# Patient Record
Sex: Female | Born: 1985
Health system: Southern US, Community
[De-identification: ages and names within clinical notes are randomized; demographics above are authoritative.]

## PROBLEM LIST (undated history)

## (undated) DIAGNOSIS — M459 Ankylosing spondylitis of unspecified sites in spine: Secondary | ICD-10-CM

## (undated) DIAGNOSIS — M069 Rheumatoid arthritis, unspecified: Secondary | ICD-10-CM

## (undated) DIAGNOSIS — D649 Anemia, unspecified: Secondary | ICD-10-CM

## (undated) HISTORY — DX: Ankylosing spondylitis of unspecified sites in spine: M45.9

## (undated) HISTORY — DX: Rheumatoid arthritis, unspecified: M06.9

---

## 2010-10-08 DIAGNOSIS — O321XX0 Maternal care for breech presentation, not applicable or unspecified: Secondary | ICD-10-CM

## 2014-08-23 DIAGNOSIS — O321XX0 Maternal care for breech presentation, not applicable or unspecified: Secondary | ICD-10-CM

## 2016-08-29 ENCOUNTER — Ambulatory Visit (INDEPENDENT_AMBULATORY_CARE_PROVIDER_SITE_OTHER): Payer: Self-pay | Admitting: Physician Assistant

## 2016-09-02 ENCOUNTER — Telehealth (INDEPENDENT_AMBULATORY_CARE_PROVIDER_SITE_OTHER): Payer: Self-pay | Admitting: Physician Assistant

## 2016-09-02 ENCOUNTER — Ambulatory Visit (INDEPENDENT_AMBULATORY_CARE_PROVIDER_SITE_OTHER): Payer: Self-pay | Admitting: Physician Assistant

## 2016-09-02 ENCOUNTER — Ambulatory Visit (HOSPITAL_COMMUNITY)
Admission: RE | Admit: 2016-09-02 | Discharge: 2016-09-02 | Disposition: A | Discharge status: Home / Self Care | Payer: Self-pay | Source: Ambulatory Visit | Attending: Physician Assistant | Admitting: Physician Assistant

## 2016-09-02 ENCOUNTER — Encounter (INDEPENDENT_AMBULATORY_CARE_PROVIDER_SITE_OTHER): Payer: Self-pay | Admitting: Physician Assistant

## 2016-09-02 VITALS — BP 99/65 | HR 81 | Temp 97.9°F | Ht 64.0 in | Wt 146.6 lb

## 2016-09-02 DIAGNOSIS — M5441 Lumbago with sciatica, right side: Secondary | ICD-10-CM | POA: Insufficient documentation

## 2016-09-02 DIAGNOSIS — N390 Urinary tract infection, site not specified: Secondary | ICD-10-CM

## 2016-09-02 DIAGNOSIS — R8281 Pyuria: Secondary | ICD-10-CM

## 2016-09-02 LAB — POCT URINALYSIS DIPSTICK
BILIRUBIN UA: NEGATIVE
Glucose, UA: NEGATIVE
NITRITE UA: NEGATIVE
PH UA: 5.5
PROTEIN UA: NEGATIVE
Spec Grav, UA: 1.02
UROBILINOGEN UA: 0.2

## 2016-09-02 LAB — POCT URINE PREGNANCY: PREG TEST UR: NEGATIVE

## 2016-09-02 MED ORDER — NAPROXEN 500 MG PO TABS: 500.0000 mg | ORAL_TABLET | Freq: Two times a day (BID) | ORAL | 1 refills | Status: DC

## 2016-09-02 MED ORDER — CIPROFLOXACIN HCL 500 MG PO TABS: 500.0000 mg | ORAL_TABLET | Freq: Two times a day (BID) | ORAL | 0 refills | Status: AC

## 2016-09-02 MED ORDER — CYCLOBENZAPRINE HCL 5 MG PO TABS: 5.0000 mg | ORAL_TABLET | Freq: Three times a day (TID) | ORAL | 1 refills | Status: DC | PRN

## 2016-09-02 MED ORDER — CIPROFLOXACIN HCL 500 MG PO TABS: 500.0000 mg | ORAL_TABLET | Freq: Two times a day (BID) | ORAL | 0 refills | Status: DC

## 2016-09-02 MED FILL — ?NAPROXEN 500 MG TAB: 500 MG | 7 days supply | Qty: 14 | Fill #0

## 2016-09-02 MED FILL — CYCLOBENZAPRINE 5 MG TABLET: 5 | 10 days supply | Qty: 30 | Fill #0

## 2016-09-02 MED FILL — ?CIPROFLOXACIN HCL 500MG TA: 500 | 3 days supply | Qty: 6 | Fill #0

## 2016-09-02 NOTE — Progress Notes (Signed)
Subjective:  Patient ID: Kathleen Nguyen, female    DOB: 1985-07-11  Age: 31 y.o. MRN: [id number]  CC:   Lower back pain  HPI Kathleen Nguyen is a 31 y.o. female with a PMH of chronic lower back pain since 6-7 years ago. Pain is felt in the lumbar spine and is associated with RLE radiculopathy.  There is also occasional pain in the T and C spine. Pain can be severe at times. No hx of fall/injury/trauma. Denies saddle paresthesia, GI dysfunction, GU dysfunction, dysuria, or LE weakness, or paralysis.     Review of Systems  Constitutional: Negative for chills, fever and malaise/fatigue.  Eyes: Negative for blurred vision.  Respiratory: Negative for shortness of breath.   Cardiovascular: Negative for chest pain and palpitations.  Gastrointestinal: Negative for abdominal pain and nausea.  Genitourinary: Negative for dysuria and hematuria.  Musculoskeletal: Positive for back pain and neck pain. Negative for joint pain and myalgias.  Skin: Negative for rash.  Neurological: Negative for tingling and headaches.  Psychiatric/Behavioral: Negative for depression. The patient is not nervous/anxious.     Objective:  BP 99/65 (BP Location: Left Arm, Patient Position: Sitting, Cuff Size: Normal)   Pulse 81   Temp 97.9 F (36.6 C) (Oral)   Ht 5\' 4"  (1.626 m)   Wt 146 lb 9.6 oz (66.5 kg)   LMP 07/29/2016 (Exact Date)   SpO2 100%   BMI 25.16 kg/m   BP/Weight 5/46/2703  Systolic BP 99  Diastolic BP 65  Wt. (Lbs) 146.6  BMI 25.16      Physical Exam  Constitutional: She is oriented to person, place, and time.  Well developed, well nourished, NAD, polite  HENT:  Head: Normocephalic and atraumatic.  Eyes: No scleral icterus.  Neck: Normal range of motion. Neck supple. No thyromegaly present.  Cardiovascular: Normal rate, regular rhythm and normal heart sounds.   Pulmonary/Chest: Effort normal and breath sounds normal.  Abdominal: Soft. Bowel sounds are normal. There is no tenderness.   Musculoskeletal: She exhibits no edema.  Back with full aROM. Mild TTP on L3-L5 vertebrae and paraspinals. R > L back pain elicited with hip flexion. No trochanteric TTP.  Neurological: She is alert and oriented to person, place, and time.  Skin: Skin is warm and dry. No rash noted. No erythema. No pallor.  Psychiatric: She has a normal mood and affect. Her behavior is normal. Thought content normal.  Vitals reviewed.    Assessment & Plan:   1. Bilateral low back pain with right-sided sciatica, unspecified chronicity - Urinalysis Dipstick - POCT urine pregnancy - cyclobenzaprine (FLEXERIL) 5 MG tablet; Take 1 tablet (5 mg total) by mouth 3 (three) times daily as needed for muscle spasms.  Dispense: 30 tablet; Refill: 1 - naproxen (NAPROSYN) 500 MG tablet; Take 1 tablet (500 mg total) by mouth 2 (two) times daily with a meal.  Dispense: 14 tablet; Refill: 1 - DG Lumbar Spine Complete  2. Pyuria - ciprofloxacin (CIPRO) 500 MG tablet; Take 1 tablet (500 mg total) by mouth 2 (two) times daily.  Dispense: 6 tablet; Refill: 0 - Urine culture   Meds ordered this encounter  Medications  . cyclobenzaprine (FLEXERIL) 5 MG tablet    Sig: Take 1 tablet (5 mg total) by mouth 3 (three) times daily as needed for muscle spasms.    Dispense:  30 tablet    Refill:  1    Order Specific Question:   Supervising Provider    Answer:   Angelica Chessman  E [[id number]]  . naproxen (NAPROSYN) 500 MG tablet    Sig: Take 1 tablet (500 mg total) by mouth 2 (two) times daily with a meal.    Dispense:  14 tablet    Refill:  1    Order Specific Question:   Supervising Provider    Answer:   Tresa Garter [id number]  . ciprofloxacin (CIPRO) 500 MG tablet    Sig: Take 1 tablet (500 mg total) by mouth 2 (two) times daily.    Dispense:  6 tablet    Refill:  0    Order Specific Question:   Supervising Provider    Answer:   Tresa Garter [[id number]]    Follow-up: Return in about 10 days (around  09/12/2016).   Clent Demark PA

## 2016-09-02 NOTE — Telephone Encounter (Signed)
Rx sent to [facility] pharmacy. Patient notified. Nat Christen, CMA

## 2016-09-02 NOTE — Patient Instructions (Signed)

## 2016-09-02 NOTE — Telephone Encounter (Signed)
Patient at [facility] for Rx,  But they were sent to [facility]  Per Deana, at [facility] please send Rx to pharm

## 2016-09-09 ENCOUNTER — Telehealth (INDEPENDENT_AMBULATORY_CARE_PROVIDER_SITE_OTHER): Payer: Self-pay | Admitting: Physician Assistant

## 2016-09-09 NOTE — Telephone Encounter (Signed)
Patient called stated [facility] told her no Rx For her.  Patient also wants to discuss labs result with doctor and nurse with an Arabic interpreter.  Please call [phone]

## 2016-09-09 NOTE — Telephone Encounter (Signed)
Return here for a dedicated time to discuss results. Get [facility] to transfer Rx to [facility] or tell patient to pick up at [facility].

## 2016-09-10 NOTE — Telephone Encounter (Signed)
Spoke with [facility] pharmacy Rx is there and ready for pick up. Nat Christen, CMA

## 2016-09-10 NOTE — Telephone Encounter (Signed)
Spoke with patient. Appointment scheduled for 3/22 at 10:15 for lab results. Patient stated [facility] does not have Rx there for her. Will call and verify, if in fact they do not will have PCP resend. Nat Christen, CMA

## 2016-09-12 ENCOUNTER — Ambulatory Visit (INDEPENDENT_AMBULATORY_CARE_PROVIDER_SITE_OTHER): Payer: Self-pay | Admitting: Physician Assistant

## 2016-09-12 ENCOUNTER — Encounter (INDEPENDENT_AMBULATORY_CARE_PROVIDER_SITE_OTHER): Payer: Self-pay | Admitting: Physician Assistant

## 2016-09-12 VITALS — BP 103/70 | HR 83 | Temp 99.4°F | Ht 64.0 in | Wt 148.0 lb

## 2016-09-12 DIAGNOSIS — R8281 Pyuria: Secondary | ICD-10-CM

## 2016-09-12 DIAGNOSIS — M545 Low back pain, unspecified: Secondary | ICD-10-CM

## 2016-09-12 DIAGNOSIS — R0981 Nasal congestion: Secondary | ICD-10-CM

## 2016-09-12 DIAGNOSIS — N390 Urinary tract infection, site not specified: Secondary | ICD-10-CM

## 2016-09-12 DIAGNOSIS — R5383 Other fatigue: Secondary | ICD-10-CM

## 2016-09-12 LAB — POCT URINALYSIS DIPSTICK
BILIRUBIN UA: NEGATIVE
Glucose, UA: NEGATIVE
Nitrite, UA: NEGATIVE
Protein, UA: NEGATIVE
Spec Grav, UA: 1.02 (ref 1.030–1.035)
Urobilinogen, UA: 0.2 (ref ?–2.0)
pH, UA: 6.5 (ref 5.0–8.0)

## 2016-09-12 LAB — POCT URINE PREGNANCY: Preg Test, Ur: NEGATIVE

## 2016-09-12 MED ORDER — DIPHENHYDRAMINE HCL 25 MG PO TABS: 25.0000 mg | ORAL_TABLET | Freq: Two times a day (BID) | ORAL | 0 refills | Status: DC

## 2016-09-12 MED ORDER — CIPROFLOXACIN HCL 500 MG PO TABS: 500.0000 mg | ORAL_TABLET | Freq: Two times a day (BID) | ORAL | 0 refills | Status: AC

## 2016-09-12 NOTE — Patient Instructions (Signed)
Urine Culture and Sensitivity Testing Why am I having this test? A urine culture is a test to see if germs grow from your urine sample. Normally, urine is free of germs (sterile). Germs in urine are usually bacteria. Sometimes they can be yeasts. These germs can cause a urinary tract infection (UTI). You may have this test if you have symptoms of a UTI. These may include:  Frequent urination.  Burning pain when passing urine. If you are pregnant, your health care provider may order this test to screen you for a UTI. When you pass urine, the urine flows through the tube that empties your bladder (urethra). In men, urine comes out through an opening at the tip of the penis. In women, it comes out of the body from just above the vaginal opening. These areas may have bacteria near them that normally live on the skin (normal flora). What kind of sample is taken? A urine sample for a culture test must be collected in a way that keeps normal flora from getting into the sample. The method used most often is called a clean-catch sample. In a few cases, urine may need to be collected directly from the bladder using a thin, flexible tube (catheter). The health care provider puts the catheter through the person's urethra and into the bladder. Your urine sample will be placed onto plates containing a substance that encourages bacteria to grow (agar plates). These plates are kept at body temperature for 24-48 hours to see if bacteria or other germs grow. Then, a lab technician examines them under a microscope to check for germs. Any germs that grow from the culture will be tested against a variety of medicines to find the one that works best (sensitivity testing). For a UTI caused by bacteria, several types of antibiotic medicines may be tested. How do I prepare for this test?  Do not urinate for about an hour before collecting the sample.  Drink a glass of water about 20 minutes before collecting the  sample.  Tell your health care provider if you have been taking antibiotics. This may affect the results of your test. Your health care provider may give you sterile wipes to clean your vagina or penis to prepare for collecting a clean-catch sample. To collect the sample, you will need to do the following: For Women and Girls   Sit on the toilet and spread the lips of your vagina.  Use one wipe to clean your vaginal area from front to back.  Use a second wipe to clean the opening of your urethra.  Pass a small amount of urine directly into the toilet while still spreading your vagina.  Then, hold the sterile cup underneath you and urinate into it.  Fill the cup about halfway. Cap it and return it for testing. For Men and Boys   Use the sterile wipe to clean the tip of your penis.  Pass a small amount of urine directly into the toilet first.  Then, urinate into the sterile cup.  Fill the cup about halfway. Cap it and return it for testing. What do the results mean? The result of a urine culture and sensitivity test will be positive or negative.  If enough bacteria grow from your urine sample, your test result is considered positive.  If many different bacteria grow from your urine sample, your test may be reported as contaminated.  If no bacteria grow from your sample after 24-48 hours, your test result is considered negative.  Results of sensitivity testing let your health care provider know which medicines to use to treat your infection. If the results of your urine culture are negative, this means:  It is less likely that you have a UTI.  Your test may be repeated if you still have symptoms. If the results of your urine culture are positive, this means:  It is more likely that you have a UTI.  You may need to start treatment based on your sensitivity results. Talk to your health care provider to discuss your results, treatment options, and if necessary, the need for  more tests. It is your responsibility to obtain your test results. Ask the lab or department performing the test when and how you will get your results. Talk with your health care provider if you have any questions about your results. Talk with your health care provider to discuss your results, treatment options, and if necessary, the need for more tests. Talk with your health care provider if you have any questions about your results. This information is not intended to replace advice given to you by your health care provider. Make sure you discuss any questions you have with your health care provider. Document Released: 07/05/2004 Document Revised: 02/14/2016 Document Reviewed: 10/07/2013 Elsevier Interactive Patient Education  2017 [employer].

## 2016-09-12 NOTE — Progress Notes (Signed)
Subjective:  Patient ID: Kathleen Nguyen, female    DOB: November 29, 1985  Age: 31 y.o. MRN: [id number]  CC: discuss lab results  HPI Kathleen Nguyen is a 31 y.o. female with a recent hx of LBP. Found to have trace leukocytes on last encounter 10 days ago. Laboratory never cultured due to not having specimen. Patient did not fill Cipro due to not knowing where [facility] was. XR of lumbar spine was unremarkable. Still feels nonradiating pain in the lower right flank and back. Patient is concerned and frustrated that information is lacking and she has not been able to start cipro yet. She also complains of nasal congestion and sore throat x3 days. No close contacts with the same. Lastly, she is concerned that she may have something wrong with her thyroid due to her sore throat. Denies fever, chills, nausea, vomiting, CP, SOB, HA, abdominal pain, rash, dysuria, urinary frequency, anuria, oliguria, or bowel dysfunction.    Outpatient Medications Prior to Visit  Medication Sig Dispense Refill  . cyclobenzaprine (FLEXERIL) 5 MG tablet Take 1 tablet (5 mg total) by mouth 3 (three) times daily as needed for muscle spasms. 30 tablet 1  . medroxyPROGESTERone (DEPO-PROVERA) 150 MG/ML injection Inject 150 mg into the muscle every 3 (three) months.    . naproxen (NAPROSYN) 500 MG tablet Take 1 tablet (500 mg total) by mouth 2 (two) times daily with a meal. 14 tablet 1   No facility-administered medications prior to visit.      ROS Review of Systems  Constitutional: Negative for chills, fever and malaise/fatigue.  HENT: Positive for congestion and sore throat. Negative for ear pain and sinus pain.   Eyes: Negative for blurred vision, pain, discharge and redness.  Respiratory: Negative for cough, hemoptysis, shortness of breath and wheezing.   Cardiovascular: Negative for chest pain and palpitations.  Gastrointestinal: Negative for abdominal pain, nausea and vomiting.  Genitourinary: Positive for flank pain.  Negative for dysuria, frequency, hematuria and urgency.  Musculoskeletal: Positive for back pain. Negative for joint pain and myalgias.  Skin: Negative for rash.  Neurological: Negative for tingling and headaches.  Psychiatric/Behavioral: Negative for depression. The patient is not nervous/anxious.     Objective:  BP 103/70 (BP Location: Left Arm, Patient Position: Sitting, Cuff Size: Normal)   Pulse 83   Temp 99.4 F (37.4 C) (Oral)   Ht 5\' 4"  (1.626 m)   Wt 148 lb (67.1 kg)   LMP 09/08/2016 (Exact Date)   SpO2 100%   BMI 25.40 kg/m   BP/Weight 09/12/2016 7/82/9562  Systolic BP 130 99  Diastolic BP 70 65  Wt. (Lbs) 148 146.6  BMI 25.4 25.16      Physical Exam  Constitutional: She is oriented to person, place, and time.  NAD, worried/frustrated, normal body habitus  HENT:  Head: Normocephalic and atraumatic.  Mouth/Throat: No oropharyngeal exudate.  Turbinates hypertrophic, mildly erythematous, and injected bilaterally with scant clear rhinorrhea. TMs with mild erythema, no bulging or retraction  Eyes: No scleral icterus.  Neck: Normal range of motion. Neck supple. No thyromegaly present.  Cardiovascular: Normal rate, regular rhythm and normal heart sounds.   Pulmonary/Chest: Effort normal and breath sounds normal.  Abdominal: Soft. Bowel sounds are normal.  Musculoskeletal: She exhibits no edema.  Mild TTP of the right flank and right lower back. No increased muscular tonicity.  Lymphadenopathy:    She has no cervical adenopathy.  Neurological: She is alert and oriented to person, place, and time.  Skin: Skin is warm  and dry.  Psychiatric: Thought content normal.  Tearful due to concern about pyuria and back pain     Assessment & Plan:   1. Nasal congestion - Viral vs allergic - diphenhydrAMINE (BENADRYL) 25 MG tablet; Take 1 tablet (25 mg total) by mouth 2 (two) times daily.  Dispense: 30 tablet; Refill: 0  2. Fatigue, unspecified type - TSH - CBC With  Differential - Comprehensive metabolic panel  3. Pyuria - Urine culture - Urinalysis Dipstick- trace leuko, rbc, and ketones - uHCG negative - Cipro rx printed and given to patient.  4. Low back pain without sciatica, unspecified back pain laterality, unspecified chronicity - XR L spine on 09/02/16 negative   Meds ordered this encounter  Medications  . diphenhydrAMINE (BENADRYL) 25 MG tablet    Sig: Take 1 tablet (25 mg total) by mouth 2 (two) times daily.    Dispense:  30 tablet    Refill:  0    Order Specific Question:   Supervising Provider    Answer:   Tresa Garter [id number]    Follow-up: Return if symptoms worsen or fail to improve.   Clent Demark PA

## 2016-09-13 LAB — CBC WITH DIFFERENTIAL
BASOS: 0 %
Basophils Absolute: 0 10*3/uL (ref 0.0–0.2)
EOS (ABSOLUTE): 0 10*3/uL (ref 0.0–0.4)
EOS: 1 %
HEMATOCRIT: 36.5 % (ref 34.0–46.6)
HEMOGLOBIN: 12 g/dL (ref 11.1–15.9)
Immature Grans (Abs): 0 10*3/uL (ref 0.0–0.1)
Immature Granulocytes: 0 %
LYMPHS ABS: 1.4 10*3/uL (ref 0.7–3.1)
Lymphs: 28 %
MCH: 27.5 pg (ref 26.6–33.0)
MCHC: 32.9 g/dL (ref 31.5–35.7)
MCV: 84 fL (ref 79–97)
MONOCYTES: 7 %
MONOS ABS: 0.3 10*3/uL (ref 0.1–0.9)
NEUTROS ABS: 3.2 10*3/uL (ref 1.4–7.0)
Neutrophils: 64 %
RBC: 4.36 x10E6/uL (ref 3.77–5.28)
RDW: 14 % (ref 12.3–15.4)
WBC: 4.9 10*3/uL (ref 3.4–10.8)

## 2016-09-13 LAB — COMPREHENSIVE METABOLIC PANEL
A/G RATIO: 1.4 (ref 1.2–2.2)
ALBUMIN: 4.8 g/dL (ref 3.5–5.5)
ALT: 26 IU/L (ref 0–32)
AST: 17 IU/L (ref 0–40)
Alkaline Phosphatase: 37 IU/L — ABNORMAL LOW (ref 39–117)
BILIRUBIN TOTAL: 0.2 mg/dL (ref 0.0–1.2)
BUN / CREAT RATIO: 17 (ref 9–23)
BUN: 9 mg/dL (ref 6–20)
CO2: 26 mmol/L (ref 18–29)
CREATININE: 0.53 mg/dL — AB (ref 0.57–1.00)
Calcium: 9.6 mg/dL (ref 8.7–10.2)
Chloride: 98 mmol/L (ref 96–106)
GFR calc Af Amer: 147 mL/min/{1.73_m2} (ref >59)
GFR calc non Af Amer: 128 mL/min/{1.73_m2} (ref >59)
GLOBULIN, TOTAL: 3.4 g/dL (ref 1.5–4.5)
Glucose: 99 mg/dL (ref 65–99)
POTASSIUM: 4 mmol/L (ref 3.5–5.2)
SODIUM: 140 mmol/L (ref 134–144)
Total Protein: 8.2 g/dL (ref 6.0–8.5)

## 2016-09-13 LAB — TSH: TSH: 0.836 u[IU]/mL (ref 0.450–4.500)

## 2016-09-14 LAB — URINE CULTURE: ORGANISM ID, BACTERIA: NO GROWTH

## 2016-09-23 ENCOUNTER — Ambulatory Visit (INDEPENDENT_AMBULATORY_CARE_PROVIDER_SITE_OTHER): Payer: Self-pay | Admitting: Physician Assistant

## 2016-09-23 ENCOUNTER — Encounter (INDEPENDENT_AMBULATORY_CARE_PROVIDER_SITE_OTHER): Payer: Self-pay | Admitting: Physician Assistant

## 2016-09-23 VITALS — BP 85/57 | HR 67 | Temp 98.1°F | Resp 18 | Ht 63.78 in | Wt 150.0 lb

## 2016-09-23 DIAGNOSIS — R102 Pelvic and perineal pain: Secondary | ICD-10-CM

## 2016-09-23 DIAGNOSIS — G8929 Other chronic pain: Secondary | ICD-10-CM

## 2016-09-23 DIAGNOSIS — M5441 Lumbago with sciatica, right side: Secondary | ICD-10-CM

## 2016-09-23 DIAGNOSIS — J309 Allergic rhinitis, unspecified: Secondary | ICD-10-CM

## 2016-09-23 DIAGNOSIS — B379 Candidiasis, unspecified: Secondary | ICD-10-CM

## 2016-09-23 MED ORDER — FLUCONAZOLE 150 MG PO TABS
150.0000 mg | ORAL_TABLET | Freq: Once | ORAL | 0 refills | Status: AC
Start: 2016-09-23 — End: 2016-09-23

## 2016-09-23 MED ORDER — GABAPENTIN 300 MG PO CAPS: 300.0000 mg | ORAL_CAPSULE | Freq: Three times a day (TID) | ORAL | 3 refills | Status: DC

## 2016-09-23 MED ORDER — TRAMADOL HCL 50 MG PO TABS: 50.0000 mg | ORAL_TABLET | Freq: Two times a day (BID) | ORAL | 0 refills | Status: AC | PRN

## 2016-09-23 MED FILL — GABAPENTIN 300 MG CAPSULE: 300 | 30 days supply | Qty: 90 | Fill #0

## 2016-09-23 MED FILL — FLUCONAZOLE 150 MG TABLET: 150 | 1 days supply | Qty: 1 | Fill #0

## 2016-09-23 MED FILL — traMADol HCL 50 MG TABS: 50 | 5 days supply | Qty: 10 | Fill #0

## 2016-09-23 NOTE — Progress Notes (Signed)
Subjective:  Patient ID: Kathleen Nguyen, female    DOB: 12/03/1985  Age: 31 y.o. MRN: [id number]  CC: Back pain.  HPI Kathleen Nguyen is a 31 y.o. female with a PMH of chronic LBP presents with bilateral LBP with radiculopathy to the posterior RLE that extends to her right heel. 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 Most painful on flexion of the back but also felt with all planes of movement. Denies saddle paresthesia, urinary/bowel dysfunction, weakness, paralysis.     She would also like to address right sided pelvic pain during menstruation. Does not have pain otherwise. Would like to have an US done. Denies dyspareunia, vaginal discharge, abnormal uterine bleeding. Recent laboratory testing showed negative urine culture, normal CBC, and normal CMP.     Lastly, she complains of continued mucus production. She is concerned it is a virus and that it is lasting so long. Took the OTC products for cold and flu with relief of nasal congestion. Had drying of the mouth. However, mucus production returned once she finished the cold/flu remedies. Does not endorse any constitutional symptoms.   Outpatient Medications Prior to Visit  Medication Sig Dispense Refill  . cyclobenzaprine (FLEXERIL) 5 MG tablet Take 1 tablet (5 mg total) by mouth 3 (three) times daily as needed for muscle spasms. 30 tablet 1  . diphenhydrAMINE (BENADRYL) 25 MG tablet Take 1 tablet (25 mg total) by mouth 2 (two) times daily. 30 tablet 0  . medroxyPROGESTERone (DEPO-PROVERA) 150 MG/ML injection Inject 150 mg into the muscle every 3 (three) months.    . naproxen (NAPROSYN) 500 MG tablet Take 1 tablet (500 mg total) by mouth 2 (two) times daily with a meal. 14 tablet 1   No facility-administered medications prior to visit.      ROS Review of Systems  Constitutional: Negative for chills, fever and malaise/fatigue.  HENT: Positive for congestion.   Eyes: Negative for blurred  vision.  Respiratory: Negative for cough, shortness of breath and wheezing.   Cardiovascular: Negative for chest pain and palpitations.  Gastrointestinal: Positive for abdominal pain (right sided pelvic pain). Negative for blood in stool, diarrhea, nausea and vomiting.  Genitourinary: Negative for dysuria and hematuria.  Musculoskeletal: Positive for back pain. Negative for joint pain and myalgias.  Skin: Negative for rash.  Neurological: Negative for tingling and headaches.  Psychiatric/Behavioral: Negative for depression. The patient is not nervous/anxious.     Objective:  BP (!) 85/57 (BP Location: Left Arm, Patient Position: Sitting, Cuff Size: Normal)   Pulse 67   Temp 98.1 F (36.7 C) (Oral)   Resp 18   Ht 5' 3.78" (1.62 m)   Wt 150 lb (68 kg)   LMP 09/08/2016 (Exact Date)   SpO2 99%   BMI 25.93 kg/m   BP/Weight 09/23/2016 09/12/2016 12/22/1599  Systolic BP 85 093 99  Diastolic BP 57 70 65  Wt. (Lbs) 150 148 146.6  BMI 25.93 25.4 25.16      Physical Exam  Constitutional: She is oriented to person, place, and time.  Well developed, well nourished, NAD, polite  HENT:  Head: Normocephalic and atraumatic.  Eyes: No scleral icterus.  Neck: Normal range of motion.  Cardiovascular: Normal rate, regular rhythm and normal heart sounds.   Pulmonary/Chest: Effort normal and breath sounds normal.  Genitourinary:  Genitourinary Comments: Moderate amount of thick, white, clumpy discharge. Normal vulva, vaginal walls, and cervix. No cervical motion tenderness. No adnexal  TTP, uterus normal size.  Musculoskeletal: She exhibits no edema.  Full aROM of the back but back pain elicited moreso with flexion, left rotation, and left lateral flexion. Pain elicited less so with right rotation and right lateral flexion. No pain elicited on extension. Location of pain in the paraspinals of L4-L5-S1  Neurological: She is alert and oriented to person, place, and time.  Skin: Skin is warm and dry.  No rash noted. No erythema. No pallor.  Psychiatric: She has a normal mood and affect. Her behavior is normal. Thought content normal.  Vitals reviewed.    Assessment & Plan:   1. Chronic bilateral low back pain with right-sided sciatica - Ambulatory referral to Physical Therapy - Ambulatory referral to Orthopedics - gabapentin (NEURONTIN) 300 MG capsule; Take 1 capsule (300 mg total) by mouth 3 (three) times daily.  Dispense: 90 capsule; Refill: 3 - traMADol (ULTRAM) 50 MG tablet; Take 1 tablet (50 mg total) by mouth every 12 (twelve) hours as needed.  Dispense: 10 tablet; Refill: 0  2. Pelvic pain - US Pelvis Complete; Future - US TRANSVAGINAL COMPLETE  3. Yeast infection - fluconazole (DIFLUCAN) 150 MG tablet; Take 1 tablet (150 mg total) by mouth once.  Dispense: 1 tablet; Refill: 0  4. Allergic rhinitis - Educated patient about allergies as the cause of her nasal congestion as opposed to a viral infection. Patient given the choice to have allergist referral but declined at this time due to back pain work up/referrals being conducted now. I have advised to take Fluticasone OTC and Xyzal OTC for now.    Meds ordered this encounter  Medications  . gabapentin (NEURONTIN) 300 MG capsule    Sig: Take 1 capsule (300 mg total) by mouth 3 (three) times daily.    Dispense:  90 capsule    Refill:  3    Order Specific Question:   Supervising Provider    Answer:   Tresa Garter [id number]  . traMADol (ULTRAM) 50 MG tablet    Sig: Take 1 tablet (50 mg total) by mouth every 12 (twelve) hours as needed.    Dispense:  10 tablet    Refill:  0    Order Specific Question:   Supervising Provider    Answer:   Tresa Garter [id number]  . fluconazole (DIFLUCAN) 150 MG tablet    Sig: Take 1 tablet (150 mg total) by mouth once.    Dispense:  1 tablet    Refill:  0    Order Specific Question:   Supervising Provider    Answer:   Tresa Garter [id number]    Follow-up: Return  in about 4 weeks (around 10/21/2016) for f/u back pain, pelvic pain.   Clent Demark PA

## 2016-09-23 NOTE — Patient Instructions (Addendum)

## 2016-09-23 NOTE — Progress Notes (Signed)
Patient is here for back pain  Patient complains of sharp back pain being present for the past 6 years. Pain is scaled currently at a 10. Pain radiates to the hips and down the right leg and foot.  Patient has not taken medication today. Patient has not eaten today.

## 2016-09-27 ENCOUNTER — Ambulatory Visit (INDEPENDENT_AMBULATORY_CARE_PROVIDER_SITE_OTHER): Payer: Self-pay

## 2016-10-01 ENCOUNTER — Other Ambulatory Visit (INDEPENDENT_AMBULATORY_CARE_PROVIDER_SITE_OTHER): Payer: Self-pay | Admitting: Physician Assistant

## 2016-10-01 DIAGNOSIS — R102 Pelvic and perineal pain: Secondary | ICD-10-CM

## 2016-10-01 NOTE — Progress Notes (Signed)
Imaging called to change Transvaginal US order to Non OB.

## 2016-10-02 ENCOUNTER — Ambulatory Visit: Payer: Self-pay | Admitting: Physical Therapy

## 2016-10-07 ENCOUNTER — Encounter: Payer: Self-pay | Admitting: Physical Therapy

## 2016-10-07 ENCOUNTER — Ambulatory Visit: Payer: Self-pay | Attending: Physician Assistant | Admitting: Physical Therapy

## 2016-10-07 DIAGNOSIS — G8929 Other chronic pain: Secondary | ICD-10-CM | POA: Insufficient documentation

## 2016-10-07 DIAGNOSIS — M6281 Muscle weakness (generalized): Secondary | ICD-10-CM | POA: Insufficient documentation

## 2016-10-07 DIAGNOSIS — M5440 Lumbago with sciatica, unspecified side: Secondary | ICD-10-CM | POA: Insufficient documentation

## 2016-10-07 NOTE — Patient Instructions (Signed)
Quadruped stretching, cat/camel and resting pose (childs pose)

## 2016-10-07 NOTE — Therapy (Signed)
[place], [place], [address] Phone: [phone]   Fax:  [phone]  Physical Therapy Evaluation  Patient Details  Name: Kathleen Nguyen MRN: [id number] Date of Birth: 1986/01/31 Referring Provider: Dr. Altamease Oiler  Encounter Date: 10/07/2016      PT End of Session - 10/07/16 1052    Visit Number 1   Number of Visits 16   Date for PT Re-Evaluation 12/02/16   PT Start Time 1055   PT Stop Time 1150   PT Time Calculation (min) 55 min   Activity Tolerance Patient limited by pain;Patient tolerated treatment well   Behavior During Therapy [facility] for tasks assessed/performed      History reviewed. No pertinent past medical history.  History reviewed. No pertinent surgical history.  There were no vitals filed for this visit.       Subjective Assessment - 10/07/16 1053    Subjective Patient presents with bilateral  (R>L)  low back pain which began about 6-7 yrs ago when she was in [place].  Reports no trauma.   Recently her pain has increased, even has pain with prayer stretch.   She endorses occasional numbness and weakness in legs when pain is severe.  She has difficulty walking, sitting on hard surfaces, lifting, even laying down.  She has pain in low back and into both legs intermittently.  She also has a 31 yr old and also cannot  drive, work because of her pain.    Patient is accompained by: Interpreter   Pertinent History pelvic pain    Limitations Sitting;Standing;Walking;House hold activities;Lifting   How long can you sit comfortably? 1-2 hours    How long can you stand comfortably? >1 hour    How long can you walk comfortably? carrying an object makes it difficult    Diagnostic tests MRI in [place] showed "prolapsed disc" and XR was nl. here in [place]    Patient Stated Goals Her goal is to have no pain and to know what she has going on.    Currently in Pain? Yes   Pain Score 5   premedicated    Pain Location  Back   Pain Orientation Lower;Right;Left   Pain Descriptors / Indicators Aching;Discomfort;Dull   Pain Type Chronic pain   Pain Radiating Towards legs, posterior but not currently    Pain Onset More than a month ago   Pain Frequency Intermittent   Aggravating Factors  activity    Pain Relieving Factors pain meds    Effect of Pain on Daily Activities unable to do normal activities without pain             [facility] PT Assessment - 10/07/16 1052      Assessment   Medical Diagnosis low back pain with Rt. sided sciatica    Referring Provider Dr. 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, radio and marketing    Leisure has 2 young children      Cognition   Overall Cognitive Status Within Functional Limits for tasks assessed     Observation/Other Assessments   Focus on Therapeutic Outcomes (FOTO)  69%     Sensation   Light Touch Appears Intact     Coordination   Gross Motor Movements are Fluid and Coordinated Not tested     Posture/Postural Control   Posture/Postural Control Postural limitations   Postural Limitations Increased lumbar lordosis   Posture Comments mild     AROM   Lumbar Flexion 80 deg  pain with return to standing    Lumbar Extension 40 deg  pain central    Lumbar - Right Side Bend touches knee  pain on L    Lumbar - Left Side Bend touches knee   Lumbar -  Right Rotation 25% pain on L    Lumbar - Left Rotation 25%      Strength   Right Hip Flexion 4+/5   Right Hip ABduction 3+/5   Left Hip Flexion 4+/5   Right Knee Flexion 4+/5   Right Knee Extension 4+/5   Left Knee Flexion 4+/5   Left Knee Extension 4+/5   Right Ankle Dorsiflexion 5/5   Left Ankle Dorsiflexion 5/5     Palpation   Spinal mobility normal mobility but painful P/A mobs to L3-L4-L5    Palpation comment TTP lower lumbar and into gluteals superior and lateral      FABER test   findings Positive   Comment pain in back with both sides     Straight Leg Raise   Findings Negative   Comment bilat., End range pain      other   Comments prone hip ext produced excessive lumbar rotation      Ambulation/Gait   Gait Comments no deviations noted, other than slower than community pace         PT treatment: Quadruped stretching Posture, lifting, MHP        PT Education - 10/07/16 1447    Education provided Yes   Education Details PT/POC, HEP, lifting and body mechanics    Person(s) Educated Patient   Methods Explanation;Demonstration;Verbal cues   Comprehension Verbalized understanding;Verbal cues required;Tactile cues required;Returned demonstration;Need further instruction          PT Short Term Goals - 10/07/16 1501      PT SHORT TERM GOAL #1   Title Pt will be I with initial HEP    Time 4   Period Weeks   Status New     PT SHORT TERM GOAL #2   Title Pt will be able to report centralization of pain for most days of the week    Time 4   Period Weeks   Status New     PT SHORT TERM GOAL #3   Title Pt will report overall less pain (20-25%) for normal ADLs and home tasks.    Time 4   Period Weeks   Status New           PT Long Term Goals - 10/07/16 1503      PT LONG TERM GOAL #1   Title Pt will be able to lift child or heavy object (20-25 lbs) without increasing back pain.    Time 8   Period Weeks   Status New     PT LONG TERM GOAL #  2    Title Pt will be able to walk for 30 min at a time with min increase in back pain .    Time 8   Period Weeks   Status New     PT LONG TERM GOAL #3   Title Pt will be able to perform full HEP without cues   Time 8   Period Weeks   Status New     PT LONG TERM GOAL #4   Title FOTO score will improve to less than 50% limited.    Time 8   Period Weeks   Status New               Plan - 10/07/16 1449    Clinical Impression Statement Patient presenting with low complexity eval for chronic low back pain which has been ongoing since living in her home country of [place]. She was found to have mild lumbar instability, pain and weakness with testing.  She should do well with consistent targeted exercise.  Her pain limits her ability to maintain her home, care for her kids and be comfortable with working, lifting.     Rehab Potential Excellent   PT Frequency 2x / week   PT Duration 8 weeks   PT Treatment/Interventions ADLs/Self Care Home Management;Cryotherapy;Electrical Stimulation;Functional mobility training;Patient/family education;Neuromuscular re-education;Manual techniques;Therapeutic exercise;Ultrasound;Moist Heat;Therapeutic activities;Taping;Traction   PT Next Visit Plan develop HEP for L stab, multifidus  in prone,  (re-test motion), manual, heat    PT Home Exercise Plan quadruped stretching    Recommended Other Services consider pelvic floor PT   Consulted and Agree with Plan of Care Patient      Patient will benefit from skilled therapeutic intervention in order to improve the following deficits and impairments:  Decreased activity tolerance, Decreased mobility, Decreased strength, Improper body mechanics, Pain, Increased fascial restricitons, Decreased range of motion (questionable hypermobility in L spine )  Visit Diagnosis: Chronic low back pain with sciatica, sciatica laterality unspecified, unspecified back pain laterality  Muscle weakness  (generalized)     Problem List Patient Active Problem List   Diagnosis Date Noted  . Chronic bilateral low back pain with right-sided sciatica 09/23/2016  . Pelvic pain 09/23/2016    PAA,JENNIFER 10/07/2016, 3:12 PM  [facility] [address] [place], [place], [address] Phone: [phone]   Fax:  [phone]  Name: Kathleen Nguyen MRN: [id number] Date of Birth: [birth year]   Raeford Razor, PT 10/07/16 3:13 PM Phone: [phone] Fax: [phone]

## 2016-10-08 ENCOUNTER — Ambulatory Visit (HOSPITAL_COMMUNITY)
Admission: RE | Admit: 2016-10-08 | Discharge: 2016-10-08 | Disposition: A | Discharge status: Home / Self Care | Payer: Self-pay | Source: Ambulatory Visit | Attending: Physician Assistant | Admitting: Physician Assistant

## 2016-10-08 DIAGNOSIS — R102 Pelvic and perineal pain: Secondary | ICD-10-CM | POA: Insufficient documentation

## 2016-10-08 DIAGNOSIS — N858 Other specified noninflammatory disorders of uterus: Secondary | ICD-10-CM | POA: Insufficient documentation

## 2016-10-10 ENCOUNTER — Ambulatory Visit: Payer: Self-pay | Admitting: Physical Therapy

## 2016-10-10 DIAGNOSIS — M5440 Lumbago with sciatica, unspecified side: Principal | ICD-10-CM

## 2016-10-10 DIAGNOSIS — M6281 Muscle weakness (generalized): Secondary | ICD-10-CM

## 2016-10-10 DIAGNOSIS — G8929 Other chronic pain: Secondary | ICD-10-CM

## 2016-10-10 NOTE — Therapy (Signed)
[place], [place], [address] Phone: [phone]   Fax:  [phone]  Physical Therapy Treatment  Patient Details  Name: Kathleen Nguyen MRN: [id number] Date of Birth: 08-01-1985 Referring Provider: Dr. Altamease Oiler  Encounter Date: 10/10/2016      PT End of Session - 10/10/16 1202    Visit Number 2   Number of Visits 16   Date for PT Re-Evaluation 12/02/16   PT Start Time 1103   PT Stop Time 1156   PT Time Calculation (min) 53 min   Activity Tolerance Patient tolerated treatment well   Behavior During Therapy [facility] for tasks assessed/performed      No past medical history on file.  No past surgical history on file.  There were no vitals filed for this visit.      Subjective Assessment - 10/10/16 1105    Subjective I have back pain.  I stopped the pain meds because I want to see how I do without it and with PT.    Patient is accompained by: Interpreter   Currently in Pain? Yes   Pain Score 7    Pain Location Back   Pain Orientation Lower;Other (Comment)  central                          [facility] Adult PT Treatment/Exercise - 10/10/16 1118      Lumbar Exercises: Stretches   Active Hamstring Stretch 3 reps;30 seconds   Single Knee to Chest Stretch 2 reps;30 seconds   Lower Trunk Rotation 5 reps;10 seconds   Pelvic Tilt 10 seconds   Pelvic Tilt Limitations x 10      Lumbar Exercises: Supine   Ab Set 10 reps   Clam 10 reps;20 reps   Clam Limitations uni and bilateral    Heel Slides 10 reps   Bent Knee Raise 10 reps   Bridge 10 reps   Straight Leg Raise 10 reps   Straight Leg Raises Limitations difficult to do with good form    Large Ball Abdominal Isometric 10 reps   Large Ball Abdominal Isometric Limitations black ball    Other Supine Lumbar Exercises used a soft ball under sacrum to challenge stability      Modalities   Modalities Moist Heat     Moist Heat Therapy   Number  Minutes Moist Heat 10 Minutes   Moist Heat Location Lumbar Spine                PT Education - 10/10/16 1131    Education provided Yes   Education Details core exercises    Person(s) Educated Patient   Methods Explanation;Demonstration;Tactile cues;Verbal cues;Handout   Comprehension Verbalized understanding;Returned demonstration;Verbal cues required;Tactile cues required;Need further instruction          PT Short Term Goals - 10/07/16 1501      PT SHORT TERM GOAL #1   Title Pt will be I with initial HEP    Time 4   Period Weeks   Status New     PT SHORT TERM GOAL #2   Title Pt will be able to report centralization of pain for most days of the week    Time 4   Period Weeks   Status New     PT SHORT TERM GOAL #3   Title Pt will report overall less pain (20-25%) for normal ADLs and home tasks.    Time 4   Period  Weeks   Status New           PT Long Term Goals - 10/07/16 1503      PT LONG TERM GOAL #1   Title Pt will be able to lift child or heavy object (20-25 lbs) without increasing back pain.    Time 8   Period Weeks   Status New     PT LONG TERM GOAL #2   Title Pt will be able to walk for 30 min at a time with min increase in back pain .    Time 8   Period Weeks   Status New     PT LONG TERM GOAL #3   Title Pt will be able to perform full HEP without cues   Time 8   Period Weeks   Status New     PT LONG TERM GOAL #4   Title FOTO score will improve to less than 50% limited.    Time 8   Period Weeks   Status New               Plan - 10/10/16 1210    Clinical Impression Statement Patient able to demo and tolerate lumbar stability exercises today without increasing pain. Has HEP which has been translated for her.    PT Next Visit Plan develop HEP for L stab, multifidus  in prone,  manual, heat    PT Home Exercise Plan quadruped stretching , hamstring and KTC, core level 1    Consulted and Agree with Plan of Care Patient     Increased time for translation of more advanced concepts of lumbar stabiliization.   Patient will benefit from skilled therapeutic intervention in order to improve the following deficits and impairments:  Decreased activity tolerance, Decreased mobility, Decreased strength, Improper body mechanics, Pain, Increased fascial restricitons, Decreased range of motion  Visit Diagnosis: Chronic low back pain with sciatica, sciatica laterality unspecified, unspecified back pain laterality  Muscle weakness (generalized)     Problem List Patient Active Problem List   Diagnosis Date Noted  . Chronic bilateral low back pain with right-sided sciatica 09/23/2016  . Pelvic pain 09/23/2016    Kathleen Nguyen 10/10/2016, 12:19 PM  [facility] [address] [place], [place], [address] Phone: [phone]   Fax:  [phone]  Name: Kathleen Nguyen MRN: [id number] Date of Birth: 1985/09/24  Raeford Razor, PT 10/10/16 12:19 PM Phone: [phone] Fax: [phone]

## 2016-10-14 ENCOUNTER — Ambulatory Visit: Payer: Self-pay | Admitting: Physical Therapy

## 2016-10-14 DIAGNOSIS — G8929 Other chronic pain: Secondary | ICD-10-CM

## 2016-10-14 DIAGNOSIS — M5440 Lumbago with sciatica, unspecified side: Principal | ICD-10-CM

## 2016-10-14 DIAGNOSIS — M6281 Muscle weakness (generalized): Secondary | ICD-10-CM

## 2016-10-14 NOTE — Therapy (Signed)
[place], [place], [address] Phone: [phone]   Fax:  [phone]  Physical Therapy Treatment  Patient Details  Name: Kathleen Nguyen MRN: [id number] Date of Birth: March 21, 1986 Referring Provider: Dr. Altamease Oiler  Encounter Date: 10/14/2016      PT End of Session - 10/14/16 1158    Visit Number 3   Number of Visits 16   Date for PT Re-Evaluation 12/02/16   PT Start Time 8756   PT Stop Time 1238   PT Time Calculation (min) 50 min   Activity Tolerance Patient tolerated treatment well   Behavior During Therapy [facility] for tasks assessed/performed      No past medical history on file.  No past surgical history on file.  There were no vitals filed for this visit.      Subjective Assessment - 10/14/16 1151    Subjective I had a short lasting pain in top of L hip.  Leg 5/10 and Back 2/10. Has been doing HEP morning and night.    Currently in Pain? Yes                [facility] Adult PT Treatment/Exercise - 10/14/16 0001      Self-Care   Other Self-Care Comments  sex and back pain, positions     Therapeutic Activites    Therapeutic Activities ADL's;Other Therapeutic Activities   Other Therapeutic Activities hip hinging     Lumbar Exercises: Stretches   Active Hamstring Stretch 2 reps;30 seconds   Single Knee to Chest Stretch 2 reps;30 seconds   Lower Trunk Rotation 5 reps;10 seconds   Pelvic Tilt 10 seconds   Pelvic Tilt Limitations x 10    Piriformis Stretch 2 reps;30 seconds     Lumbar Exercises: Supine   Clam 10 reps;20 reps   Clam Limitations uni and bilateral    Bent Knee Raise 10 reps   Other Supine Lumbar Exercises tabletop legs 90/90and heel taps, more of a challenge for her.      Modalities   Modalities Moist Heat     Moist Heat Therapy   Number Minutes Moist Heat 10 Minutes   Moist Heat Location Lumbar Spine                PT Education - 10/14/16 1158    Education  provided Yes   Education Details HEP reinforcement, rationale    Person(s) Educated Patient   Methods Explanation;Demonstration   Comprehension Verbalized understanding;Returned demonstration;Verbal cues required          PT Short Term Goals - 10/14/16 1159      PT SHORT TERM GOAL #1   Title Pt will be I with initial HEP    Status Achieved     PT SHORT TERM GOAL #2   Title Pt will be able to report centralization of pain for most days of the week      PT SHORT TERM GOAL #3   Title Pt will report overall less pain (20-25%) for normal ADLs and home tasks.    Status On-going           PT Long Term Goals - 10/07/16 1503      PT LONG TERM GOAL #1   Title Pt will be able to lift child or heavy object (20-25 lbs) without increasing back pain.    Time 8   Period Weeks   Status New     PT LONG TERM GOAL #2   Title Pt  will be able to walk for 30 min at a time with min increase in back pain .    Time 8   Period Weeks   Status New     PT LONG TERM GOAL #3   Title Pt will be able to perform full HEP without cues   Time 8   Period Weeks   Status New     PT LONG TERM GOAL #4   Title FOTO score will improve to less than 50% limited.    Time 8   Period Weeks   Status New               Plan - 10/14/16 1158    Clinical Impression Statement Patient with pain centralizing with intemittent pain in L. prox hip. Worked on Economist for ADLs and functional activities.  She asked about a note authorizing her to have an apt on the first level, I told her the needs to come from a MD. We could get her a note saying she had an appt but she may need  a release if any other info from her medical record is given.    PT Next Visit Plan develop HEP for L stab, try multifidus  in prone,  manual, heat and cont with body mechanics    PT Home Exercise Plan quadruped stretching , hamstring and KTC, core level 1    Consulted and Agree with Plan of Care Patient      Patient will  benefit from skilled therapeutic intervention in order to improve the following deficits and impairments:  Decreased activity tolerance, Decreased mobility, Decreased strength, Improper body mechanics, Pain, Increased fascial restricitons, Decreased range of motion  Visit Diagnosis: Chronic low back pain with sciatica, sciatica laterality unspecified, unspecified back pain laterality  Muscle weakness (generalized)     Problem List Patient Active Problem List   Diagnosis Date Noted  . Chronic bilateral low back pain with right-sided sciatica 09/23/2016  . Pelvic pain 09/23/2016    PAA,JENNIFER 10/14/2016, 12:46 PM  [facility] [address] [place], [place], [address] Phone: [phone]   Fax:  [phone]  Name: Kathleen Nguyen MRN: [id number] Date of Birth: 09-Nov-1985   Raeford Razor, PT 10/14/16 12:46 PM Phone: [phone] Fax: [phone]

## 2016-10-21 ENCOUNTER — Ambulatory Visit: Payer: Self-pay | Admitting: Physical Therapy

## 2016-10-21 DIAGNOSIS — M6281 Muscle weakness (generalized): Secondary | ICD-10-CM

## 2016-10-21 DIAGNOSIS — M5440 Lumbago with sciatica, unspecified side: Principal | ICD-10-CM

## 2016-10-21 DIAGNOSIS — G8929 Other chronic pain: Secondary | ICD-10-CM

## 2016-10-21 NOTE — Therapy (Signed)
[place], [place], [address] Phone: [phone]   Fax:  [phone]  Physical Therapy Treatment  Patient Details  Name: Avaeh Ewer MRN: [id number] Date of Birth: 13-Dec-1985 Referring Provider: Dr. Altamease Oiler  Encounter Date: 10/21/2016      PT End of Session - 10/21/16 0728    Visit Number 4   Number of Visits 16   Date for PT Re-Evaluation 12/02/16   PT Start Time 0719   PT Stop Time 0807   PT Time Calculation (min) 48 min      No past medical history on file.  No past surgical history on file.  There were no vitals filed for this visit.      Subjective Assessment - 10/21/16 0720    Subjective Up to 7/10 with stairs    Currently in Pain? Yes   Pain Score 5    Pain Location Back   Pain Orientation Lower   Pain Descriptors / Indicators Sharp   Pain Type Chronic pain   Aggravating Factors  stairs    Pain Relieving Factors pain meds                          [facility] Adult PT Treatment/Exercise - 10/21/16 0001      Lumbar Exercises: Stretches   Active Hamstring Stretch 3 reps;30 seconds   Lower Trunk Rotation 5 reps;10 seconds   Piriformis Stretch 3 reps;30 seconds   Piriformis Stretch Limitations piriformis and knee to chest      Lumbar Exercises: Supine   Clam 20 reps   Clam Limitations green band   Bridge 10 reps   Bridge Limitations with clam x 10    Straight Leg Raise 10 reps   Straight Leg Raises Limitations 2 sets    Other Supine Lumbar Exercises tabletop legs 90/90and heel taps     Lumbar Exercises: Sidelying   Clam 20 reps   Clam Limitations each, cues for abdominal draw in     Lumbar Exercises: Quadruped   Madcat/Old Horse 10 reps   Madcat/Old Horse Limitations max cues and demonstration    Other Quadruped Lumbar Exercises prayer stretch, childs pose, added lateral stretches 2 x 30 sec each side      Modalities   Modalities Moist Heat     Moist Heat  Therapy   Number Minutes Moist Heat 10 Minutes   Moist Heat Location Lumbar Spine                  PT Short Term Goals - 10/14/16 1159      PT SHORT TERM GOAL #1   Title Pt will be I with initial HEP    Status Achieved     PT SHORT TERM GOAL #2   Title Pt will be able to report centralization of pain for most days of the week      PT SHORT TERM GOAL #3   Title Pt will report overall less pain (20-25%) for normal ADLs and home tasks.    Status On-going           PT Long Term Goals - 10/07/16 1503      PT LONG TERM GOAL #1   Title Pt will be able to lift child or heavy object (20-25 lbs) without increasing back pain.    Time 8   Period Weeks   Status New     PT LONG TERM GOAL #2  Title Pt will be able to walk for 30 min at a time with min increase in back pain .    Time 8   Period Weeks   Status New     PT LONG TERM GOAL #3   Title Pt will be able to perform full HEP without cues   Time 8   Period Weeks   Status New     PT LONG TERM GOAL #4   Title FOTO score will improve to less than 50% limited.    Time 8   Period Weeks   Status New               Plan - 10/21/16 9021    Clinical Impression Statement Sometimes has leg pain, none today, its better. Continued pain with stair climbing.  Progressing toward STGs. No pain with stabilization exercises. Began prone multifidus exercises without increased pain.   PT Next Visit Plan develop HEP for L stab, progress multifidus  in prone,  manual, heat and cont with body mechanics ; check mechanics with stairs    PT Home Exercise Plan quadruped stretching , hamstring and KTC, core level 1   Consulted and Agree with Plan of Care Patient      Patient will benefit from skilled therapeutic intervention in order to improve the following deficits and impairments:  Decreased activity tolerance, Decreased mobility, Decreased strength, Improper body mechanics, Pain, Increased fascial restricitons, Decreased  range of motion  Visit Diagnosis: Chronic low back pain with sciatica, sciatica laterality unspecified, unspecified back pain laterality  Muscle weakness (generalized)     Problem List Patient Active Problem List   Diagnosis Date Noted  . Chronic bilateral low back pain with right-sided sciatica 09/23/2016  . Pelvic pain 09/23/2016    Dorene Ar, PTA 10/21/2016, 8:21 AM  [place], [place], [address] Phone: [phone]   Fax:  [phone]  Name: Rejina Odle MRN: [id number] Date of Birth: 1986/01/01

## 2016-10-24 ENCOUNTER — Telehealth (INDEPENDENT_AMBULATORY_CARE_PROVIDER_SITE_OTHER): Payer: Self-pay | Admitting: Physician Assistant

## 2016-10-24 ENCOUNTER — Ambulatory Visit: Payer: Self-pay | Attending: Physician Assistant | Admitting: Physical Therapy

## 2016-10-24 DIAGNOSIS — M5440 Lumbago with sciatica, unspecified side: Secondary | ICD-10-CM | POA: Insufficient documentation

## 2016-10-24 DIAGNOSIS — M6281 Muscle weakness (generalized): Secondary | ICD-10-CM | POA: Insufficient documentation

## 2016-10-24 DIAGNOSIS — G8929 Other chronic pain: Secondary | ICD-10-CM | POA: Insufficient documentation

## 2016-10-24 NOTE — Telephone Encounter (Signed)
Patient called to cancel 10-25-2016 appt stated can not come but would like for nurse call her back with Arabic interpreter to discuss lab results.  Patient requesting for a call back today.

## 2016-10-24 NOTE — Telephone Encounter (Signed)
Lab results discussed with patient back in march, no need to call patient. Nat Christen, CMA

## 2016-10-25 ENCOUNTER — Ambulatory Visit (INDEPENDENT_AMBULATORY_CARE_PROVIDER_SITE_OTHER): Payer: Self-pay | Admitting: Physician Assistant

## 2016-10-28 ENCOUNTER — Ambulatory Visit: Payer: Self-pay | Admitting: Physical Therapy

## 2016-10-28 DIAGNOSIS — M5440 Lumbago with sciatica, unspecified side: Principal | ICD-10-CM

## 2016-10-28 DIAGNOSIS — G8929 Other chronic pain: Secondary | ICD-10-CM

## 2016-10-28 DIAGNOSIS — M6281 Muscle weakness (generalized): Secondary | ICD-10-CM

## 2016-10-28 NOTE — Therapy (Signed)
[place], [place], [address] Phone: [phone]   Fax:  [phone]  Physical Therapy Treatment  Patient Details  Name: Kathleen Nguyen MRN: [id number] Date of Birth: 11-20-1985 Referring Provider: Dr. Altamease Oiler  Encounter Date: 10/28/2016      PT End of Session - 10/28/16 0725    Visit Number 5   Number of Visits 16   Date for PT Re-Evaluation 12/02/16   PT Start Time 0720   PT Stop Time 6644  unable to complete treatment due to 10/10 pain   PT Time Calculation (min) 33 min      No past medical history on file.  No past surgical history on file.  There were no vitals filed for this visit.      Subjective Assessment - 10/28/16 0754    Subjective I've had more pain since last visit.    Currently in Pain? Yes   Pain Score 10-Worst pain ever   Pain Location Back   Pain Orientation Lower;Mid   Pain Descriptors / Indicators Sharp   Aggravating Factors  exercises, stairs    Pain Relieving Factors pain meds, heat, stretch                         [facility] Adult PT Treatment/Exercise - 10/28/16 0001      Self-Care   Self-Care Other Self-Care Comments   Heat/Ice Application Reviewed use of heat and gentle therex to decrease daily pain     Lumbar Exercises: Aerobic   Stationary Bike Nustep L3 UE/LE x 5 minutes      Modalities   Modalities Electrical Stimulation;Moist Heat     Moist Heat Therapy   Number Minutes Moist Heat 15 Minutes   Moist Heat Location Lumbar Spine     Electrical Stimulation   Electrical Stimulation Location thoracic, lumbar paraspinals   Electrical Stimulation Action IFC   Electrical Stimulation Parameters 73ma   Electrical Stimulation Goals Pain                  PT Short Term Goals - 10/14/16 1159      PT SHORT TERM GOAL #1   Title Pt will be I with initial HEP    Status Achieved     PT SHORT TERM GOAL #2   Title Pt will be able to report  centralization of pain for most days of the week      PT SHORT TERM GOAL #3   Title Pt will report overall less pain (20-25%) for normal ADLs and home tasks.    Status On-going           PT Long Term Goals - 10/07/16 1503      PT LONG TERM GOAL #1   Title Pt will be able to lift child or heavy object (20-25 lbs) without increasing back pain.    Time 8   Period Weeks   Status New     PT LONG TERM GOAL #2   Title Pt will be able to walk for 30 min at a time with min increase in back pain .    Time 8   Period Weeks   Status New     PT LONG TERM GOAL #3   Title Pt will be able to perform full HEP without cues   Time 8   Period Weeks   Status New     PT LONG TERM GOAL #4   Title FOTO  score will improve to less than 50% limited.    Time 8   Period Weeks   Status New               Plan - 10/28/16 0741    Clinical Impression Statement Patient reports 10/10 pain. She did not have pain during last treatment however began with 10/10 after treatment. She reports she is able to decrease pain to 4-5/10 with heat and stretches however pain continued to increase to 10/10 every day. Emergnecy services were offered and patient decided to call her MD after appointment today instead of go to the ER. Pain is in  mid/low back and hips. Trial of IFC with pt reported 4/10 pain after treatment.    PT Next Visit Plan assess IFC response/ did she call MD regarding pain? develop HEP for L stab, progress multifidus  in prone,  manual, heat and cont with body mechanics ; check mechanics with stairs    PT Home Exercise Plan quadruped stretching , hamstring and KTC, core level 1   Consulted and Agree with Plan of Care Patient      Patient will benefit from skilled therapeutic intervention in order to improve the following deficits and impairments:  Decreased activity tolerance, Decreased mobility, Decreased strength, Improper body mechanics, Pain, Increased fascial restricitons, Decreased range  of motion  Visit Diagnosis: Chronic low back pain with sciatica, sciatica laterality unspecified, unspecified back pain laterality  Muscle weakness (generalized)     Problem List Patient Active Problem List   Diagnosis Date Noted  . Chronic bilateral low back pain with right-sided sciatica 09/23/2016  . Pelvic pain 09/23/2016    Dorene Ar, PTA 10/28/2016, 8:05 AM  [facility] [address] [place], [place], [address] Phone: [phone]   Fax:  [phone]  Name: Kathleen Nguyen MRN: [id number] Date of Birth: 13-Jan-1986

## 2016-10-31 ENCOUNTER — Ambulatory Visit: Payer: Self-pay | Admitting: Physical Therapy

## 2016-10-31 DIAGNOSIS — G8929 Other chronic pain: Secondary | ICD-10-CM

## 2016-10-31 DIAGNOSIS — M5440 Lumbago with sciatica, unspecified side: Principal | ICD-10-CM

## 2016-10-31 DIAGNOSIS — M6281 Muscle weakness (generalized): Secondary | ICD-10-CM

## 2016-11-01 NOTE — Therapy (Signed)
[place], [place], [address] Phone: [phone]   Fax:  [phone]  Physical Therapy Treatment  Patient Details  Name: Kathleen Nguyen MRN: [id number] Date of Birth: [birth year] Referring Provider: Dr. Altamease Oiler  Encounter Date: 10/31/2016      PT End of Session - 10/31/16 0721    Visit Number 6   Number of Visits 16   Date for PT Re-Evaluation 12/02/16   PT Start Time 0720   PT Stop Time 0800   PT Time Calculation (min) 40 min      No past medical history on file.  No past surgical history on file.  There were no vitals filed for this visit.      Subjective Assessment - 10/31/16 0720    Subjective I am better today. The electrial stimulation helped   Patient is accompained by: Interpreter   Currently in Pain? Yes   Pain Score 2    Pain Location Back   Pain Descriptors / Indicators --  unable to describe    Pain Frequency Intermittent                         [facility] Adult PT Treatment/Exercise - 10/31/16 0001      Ambulation/Gait   Stairs Yes   Stairs Assistance 7: Independent   Stair Management Technique No rails;Alternating pattern   Number of Stairs 12   Height of Stairs 6   Gait Comments no deviations noted, other than slower than community pace      Lumbar Exercises: Stretches   Active Hamstring Stretch 3 reps;30 seconds   Single Knee to Chest Stretch 2 reps;30 seconds   Piriformis Stretch 3 reps;30 seconds   Piriformis Stretch Limitations figure 4      Lumbar Exercises: Supine   Clam 20 reps   Clam Limitations blue band    Bridge 10 reps   Bridge Limitations c/o pain in hips/ upper glutes    Straight Leg Raise 10 reps  c/o pain in hips and numbness in anterior thighs bilateral   Straight Leg Raises Limitations with abdominal brace      Lumbar Exercises: Sidelying   Clam 20 reps   Clam Limitations Blue band cues for abdominal brace      Moist Heat Therapy    Number Minutes Moist Heat 15 Minutes   Moist Heat Location Lumbar Spine     Electrical Stimulation   Electrical Stimulation Location Lower back, upper glutes    Electrical Stimulation Action IFC   Electrical Stimulation Parameters 15   Electrical Stimulation Goals Pain                  PT Short Term Goals - 10/31/16 1212      PT SHORT TERM GOAL #1   Title Pt will be I with initial HEP    Status Achieved     PT SHORT TERM GOAL #2   Title Pt will be able to report centralization of pain for most days of the week    Baseline Pain into hips    Period Weeks   Status On-going     PT SHORT TERM GOAL #3   Title Pt will report overall less pain (20-25%) for normal ADLs and home tasks.    Baseline varies    Time 4   Period Weeks   Status Unable to assess           PT Long Term  Goals - 10/07/16 1503      PT LONG TERM GOAL #1   Title Pt will be able to lift child or heavy object (20-25 lbs) without increasing back pain.    Time 8   Period Weeks   Status New     PT LONG TERM GOAL #2   Title Pt will be able to walk for 30 min at a time with min increase in back pain .    Time 8   Period Weeks   Status New     PT LONG TERM GOAL #3   Title Pt will be able to perform full HEP without cues   Time 8   Period Weeks   Status New     PT LONG TERM GOAL #4   Title FOTO score will improve to less than 50% limited.    Time 8   Period Weeks   Status New               Plan - 10/31/16 1118    Clinical Impression Statement Kathleen Nguyen reports IFC decreased pain from 10/10 to 2/10 in mid and lower back. SHe now complains of pain in her upper gluteals. Continued therex and Repeated IFC on gluteals per pt request. Trial of stairs and she can climb independently without rails however c/o upper gluteal pain. 4 inch stairs less pain.    PT Next Visit Plan assess IFC response; develop HEP for L stab, progress multifidus  in prone,  manual, heat and cont with body mechanics ;    PT Home Exercise Plan quadruped stretching , hamstring and KTC, core level 1   Consulted and Agree with Plan of Care Patient      Patient will benefit from skilled therapeutic intervention in order to improve the following deficits and impairments:  Decreased activity tolerance, Decreased mobility, Decreased strength, Improper body mechanics, Pain, Increased fascial restricitons, Decreased range of motion  Visit Diagnosis: Chronic low back pain with sciatica, sciatica laterality unspecified, unspecified back pain laterality  Muscle weakness (generalized)     Problem List Patient Active Problem List   Diagnosis Date Noted  . Chronic bilateral low back pain with right-sided sciatica 09/23/2016  . Pelvic pain 09/23/2016    Dorene Ar, PTA 11/01/2016, 7:31 AM  [facility] [address] [place], [place], [address] Phone: [phone]   Fax:  [phone]  Name: Kathleen Nguyen MRN: [id number] Date of Birth: 22-Jul-1985

## 2016-11-04 ENCOUNTER — Ambulatory Visit: Payer: Self-pay | Admitting: Physical Therapy

## 2016-11-04 DIAGNOSIS — M6281 Muscle weakness (generalized): Secondary | ICD-10-CM

## 2016-11-04 DIAGNOSIS — M5440 Lumbago with sciatica, unspecified side: Principal | ICD-10-CM

## 2016-11-04 DIAGNOSIS — G8929 Other chronic pain: Secondary | ICD-10-CM

## 2016-11-04 NOTE — Therapy (Signed)
[place], [place], [address] Phone: [phone]   Fax:  [phone]  Physical Therapy Treatment  Patient Details  Name: Kathleen Nguyen MRN: [id number] Date of Birth: [birth year] Referring Provider: Dr. Altamease Oiler  Encounter Date: 11/04/2016      PT End of Session - 11/04/16 0925    Visit Number 7   Number of Visits 16   Date for PT Re-Evaluation 12/02/16   PT Start Time 0804   PT Stop Time 0900   PT Time Calculation (min) 56 min   Activity Tolerance Patient tolerated treatment well   Behavior During Therapy [facility] for tasks assessed/performed      No past medical history on file.  No past surgical history on file.  There were no vitals filed for this visit.      Subjective Assessment - 11/04/16 0810    Subjective Discussed stairs and how when she has a little pain the stairs increase her pain.  Stairs also start her pain even if she isnt having any.  Seeing a specialist 11/12/16.    Currently in Pain? Yes   Pain Score 7    Pain Location Back   Pain Orientation Lower   Pain Descriptors / Indicators Dull   Pain Type Chronic pain   Pain Radiating Towards none in hips today.    Pain Onset More than a month ago   Pain Frequency Intermittent   Aggravating Factors  stairs    Pain Relieving Factors meds, heat, stretching and IFC               [facility] Adult PT Treatment/Exercise - 11/04/16 0821      Self-Care   Other Self-Care Comments  abdominals, HEP and SIJ possible causes of pain with stairs, how to modify     Lumbar Exercises: Stretches   Lower Trunk Rotation 5 reps;10 seconds   Piriformis Stretch 2 reps;30 seconds     Lumbar Exercises: Supine   Clam 20 reps   Bridge 10 reps     Lumbar Exercises: Prone   Other Prone Lumbar Exercises hip ext x 10 each unable to do cannot get prone Tr A contraction   done in standing with ball x 10 each leg    Other Prone Lumbar Exercises Abdominal set in  prone x 10   max cues      Moist Heat Therapy   Number Minutes Moist Heat 15 Minutes   Moist Heat Location Lumbar Spine     Electrical Stimulation   Electrical Stimulation Location Lower back, upper glutes    Electrical Stimulation Action IFC   Electrical Stimulation Parameters 15   Electrical Stimulation Goals Pain                  PT Short Term Goals - 11/04/16 0816      PT SHORT TERM GOAL #1   Title Pt will be I with initial HEP    Status Achieved     PT SHORT TERM GOAL #2   Title Pt will be able to report centralization of pain for most days of the week    Baseline trending toward improvement    Status Partially Met     PT SHORT TERM GOAL #3   Title Pt will report overall less pain (20-25%) for normal ADLs and home tasks.    Status Achieved           PT Long Term Goals - 11/04/16 3267  PT LONG TERM GOAL #1   Title Pt will be able to lift child or heavy object (20-25 lbs) without increasing back pain.    Status On-going     PT LONG TERM GOAL #2   Title Pt will be able to walk for 30 min at a time with min increase in back pain .    Status On-going     PT LONG TERM GOAL #3   Title Pt will be able to perform full HEP without cues   Status On-going     PT LONG TERM GOAL #4   Title FOTO score will improve to less than 50% limited.    Status On-going               Plan - 11/04/16 0854    Clinical Impression Statement Patient will be seeing Dr. Lorin Mercy 5/22.  She may have SIJ involvement as stairs and unilateral exercises seem to aggravate her pain.  Did not appear to have a rotation in SIJ.  Pain improved after session, heat.    PT Next Visit Plan repeat IFC, try prone stabiliztion and consider McConnell tape "X" to SIJ.     PT Home Exercise Plan quadruped stretching , hamstring and KTC, core level 1   Consulted and Agree with Plan of Care Patient      Patient will benefit from skilled therapeutic intervention in order to improve the  following deficits and impairments:  Decreased activity tolerance, Decreased mobility, Decreased strength, Improper body mechanics, Pain, Increased fascial restricitons, Decreased range of motion  Visit Diagnosis: Chronic low back pain with sciatica, sciatica laterality unspecified, unspecified back pain laterality  Muscle weakness (generalized)     Problem List Patient Active Problem List   Diagnosis Date Noted  . Chronic bilateral low back pain with right-sided sciatica 09/23/2016  . Pelvic pain 09/23/2016    PAA,JENNIFER 11/04/2016, 3:43 PM  [facility] [address] [place], [place], [address] Phone: [phone]   Fax:  [phone]  Name: Kathleen Nguyen MRN: [id number] Date of Birth: [birth year]  Raeford Razor, PT 11/04/16 3:44 PM Phone: [phone] Fax: [phone]

## 2016-11-07 ENCOUNTER — Ambulatory Visit: Payer: Self-pay | Admitting: Physical Therapy

## 2016-11-07 DIAGNOSIS — M6281 Muscle weakness (generalized): Secondary | ICD-10-CM

## 2016-11-07 DIAGNOSIS — M5440 Lumbago with sciatica, unspecified side: Principal | ICD-10-CM

## 2016-11-07 DIAGNOSIS — G8929 Other chronic pain: Secondary | ICD-10-CM

## 2016-11-07 NOTE — Therapy (Addendum)
[place], [place], [address] Phone: [phone]   Fax:  [phone]  Physical Therapy Treatment and Discharge   Patient Details  Name: Kathleen Nguyen MRN: [id number] Date of Birth: January 22, 1986 Referring Provider: Dr. Altamease Oiler  Encounter Date: 11/07/2016      PT End of Session - 11/07/16 0722    Visit Number 8   Number of Visits 16   Date for PT Re-Evaluation 12/02/16   PT Start Time 6962   PT Stop Time 0805   PT Time Calculation (min) 48 min      No past medical history on file.  No past surgical history on file.  There were no vitals filed for this visit.      Subjective Assessment - 11/07/16 0720    Subjective I am having sharp pains in my back and hips    Currently in Pain? Yes   Pain Score 10-Worst pain ever   Pain Location Back  and hips    Pain Orientation Lower   Pain Descriptors / Indicators Sharp   Aggravating Factors  wearing high heels    Pain Relieving Factors meds heat stretching             [facility] PT Assessment - 11/07/16 0001      Observation/Other Assessments   Focus on Therapeutic Outcomes (FOTO)  65% limited improved from 69% limited                      [facility] Adult PT Treatment/Exercise - 11/07/16 0001      Self-Care   Other Self-Care Comments  Discussed upcoming orthopedic visit, lack of progress in 8 visits at PT, possibility of MRI, possiblility MD may want her to continue PT, need to continue HEP and how long it takes to gain core strength      Lumbar Exercises: Stretches   Active Hamstring Stretch 3 reps;30 seconds   Single Knee to Chest Stretch 2 reps;30 seconds   Lower Trunk Rotation 5 reps;10 seconds   Piriformis Stretch 2 reps;30 seconds     Lumbar Exercises: Supine   Clam 20 reps   Bent Knee Raise 10 reps   Bent Knee Raise Limitations with ab set   Bridge 10 reps     Lumbar Exercises: Prone   Other Prone Lumbar Exercises Prone Tra  Contraction with h/s curls. unable to lift into hip extension due to pain.       Moist Heat Therapy   Number Minutes Moist Heat 15 Minutes   Moist Heat Location Lumbar Spine     Electrical Stimulation   Electrical Stimulation Location Lower back, upper glutes    Electrical Stimulation Action IFC   Electrical Stimulation Parameters 15   Electrical Stimulation Goals Pain                  PT Short Term Goals - 11/07/16 0731      PT SHORT TERM GOAL #1   Title Pt will be I with initial HEP    Time 4   Period Weeks   Status On-going     PT SHORT TERM GOAL #2   Title Pt will be able to report centralization of pain for most days of the week    Baseline pain into hips and sometimes down to thigh and ankle in bilateral legs   Time 4   Period Weeks   Status On-going     PT SHORT TERM GOAL #3  Title Pt will report overall less pain (20-25%) for normal ADLs and home tasks.    Baseline sometimes a little better, mostly the same    Time 4   Period Weeks   Status On-going           PT Long Term Goals - 11/07/16 0734      PT LONG TERM GOAL #1   Title Pt will be able to lift child or heavy object (20-25 lbs) without increasing back pain.    Baseline increased pain despite good body mechanics    Time 8   Status On-going     PT LONG TERM GOAL #2   Title Pt will be able to walk for 30 min at a time with min increase in back pain .    Baseline 20 minutes max    Time 8   Period Weeks   Status On-going     PT LONG TERM GOAL #3   Title Pt will be able to perform full HEP without cues   Time 8   Period Weeks   Status Achieved     PT LONG TERM GOAL #4   Title FOTO score will improve to less than 50% limited.    Time 8   Period Weeks   Status On-going               Plan - 11/07/16 1054    Clinical Impression Statement Pt arrived with 10/10 pain in mid lumbar paraspinals and bilateral hips. She reports pain was exaggerated after wearing high heels to a  park for 3 hours 2 days ago. She did not realize the party was outdoors at a park and had to watch children so she was on her feet the whole time. She reports the pain increased later, after she got home and worsened more after sexual intercourse. This is her last visit prior to her orthopedic appt 11/12/2016. She feels overall she is no better than before PT. Treatments help for short periods of time. We reviewed all HEP and she was encouraged to continue her HEP. PT will hold for now until she sees orthopedic. She will return if MD thinks she should.    PT Next Visit Plan HOLD PT until after MD app 11/12/2016   PT Home Exercise Plan quadruped stretching , hamstring and KTC, core level 1, piriformis and figure 4 stretch, supine marching, clams    Consulted and Agree with Plan of Care Patient      Patient will benefit from skilled therapeutic intervention in order to improve the following deficits and impairments:  Decreased activity tolerance, Decreased mobility, Decreased strength, Improper body mechanics, Pain, Increased fascial restricitons, Decreased range of motion  Visit Diagnosis: Chronic low back pain with sciatica, sciatica laterality unspecified, unspecified back pain laterality  Muscle weakness (generalized)     Problem List Patient Active Problem List   Diagnosis Date Noted  . Chronic bilateral low back pain with right-sided sciatica 09/23/2016  . Pelvic pain 09/23/2016    Dorene Ar, PTA 11/07/2016, 11:02 AM  [facility] [address] [place], [place], [address] Phone: [phone]   Fax:  [phone]  Name: Kathleen Nguyen MRN: [id number] Date of Birth: March 04, 1986  PHYSICAL THERAPY DISCHARGE SUMMARY  Visits from Start of Care: 8  Current functional level related to goals / functional outcomes: See above for most recent info.  Limited in gait, stairs, mobility.    Remaining deficits: Unknown currently,not  able to assess  Education / Equipment: HEP, Economist, posture Plan: Patient agrees to discharge.  Patient goals were not met. Patient is being discharged due to not returning since the last visit.  ?????   Lack of progress, normal MRI recently.  Raeford Razor, PT 12/04/16 1:44 PM Phone: [phone] Fax: [phone]

## 2016-11-12 ENCOUNTER — Ambulatory Visit (INDEPENDENT_AMBULATORY_CARE_PROVIDER_SITE_OTHER): Payer: Self-pay | Admitting: Orthopaedic Surgery

## 2016-11-12 ENCOUNTER — Encounter (INDEPENDENT_AMBULATORY_CARE_PROVIDER_SITE_OTHER): Payer: Self-pay | Admitting: Orthopaedic Surgery

## 2016-11-12 VITALS — BP 112/71 | HR 91 | Ht 64.0 in | Wt 146.0 lb

## 2016-11-12 DIAGNOSIS — M545 Low back pain: Secondary | ICD-10-CM

## 2016-11-12 DIAGNOSIS — G8929 Other chronic pain: Secondary | ICD-10-CM

## 2016-11-12 NOTE — Progress Notes (Signed)
Office Visit Note   Patient: Kathleen Nguyen           Date of Birth: 03/11/1986           MRN: [id number] Visit Date: 11/12/2016              Requested by: Clent Demark, PA-C [place], [place] [address] PCP: Clent Demark, PA-C   Assessment & Plan: Visit Diagnoses:  1. Chronic midline low back pain without sciatica     Plan: We'll obtain a lumbar MRI scan FOLLOW-up after scan for review.  Follow-Up Instructions: Office follow-up after lumbar MRI.  Orders:  No orders of the defined types were placed in this encounter.  No orders of the defined types were placed in this encounter.     Procedures: No procedures performed   Clinical Data: No additional findings.   Subjective: Chief Complaint  Patient presents with  . Lower Back - Pain    HPI 31 year old female from [place] states she's been hurting for at least 6 years. She has pain in her back upper lumbar radiates into her hips. She has had great difficulty walking at times she rates her pain is severe. She states her pain is severe when she walks for long time. She also has severe pain if she tries to lift. When she wears high heels she has severe pain 10 out of 10 states pain severe with stairs. She lives on the third floor and states it's very painful to get up to the third floor. She's been on anti-inflammatories and muscle relaxants Neurontin had at least 8 physical therapy visits without improvement. She states she has trouble getting the third floor and wants to have a letter so she can have an apartment on the first floor. She had an MRI in [place] and was told she had a disc problem.  Review of Systems 14 point review of systems updated and is negative is a pertains to history of present illness. Negative for associated bowel or bladder symptoms. No fever chills.  Objective: Vital Signs: BP 112/71   Pulse 91   Ht 5\' 4"  (1.626 m)   Wt 146 lb (66.2 kg)   BMI 25.06 kg/m   Physical Exam    Constitutional: She is oriented to person, place, and time. She appears well-developed.  HENT:  Head: Normocephalic.  Right Ear: External ear normal.  Left Ear: External ear normal.  Eyes: Pupils are equal, round, and reactive to light.  Neck: No tracheal deviation present. No thyromegaly present.  Cardiovascular: Normal rate.   Pulmonary/Chest: Effort normal.  Abdominal: Soft.  Musculoskeletal:  L pelvis is level. She can forward flex fingertips almost a floor. She presents to reck position without using her hands on  her thighs. Hip range of motion is normal. She complains of pain with straight leg raising 90 negative proctoscopy compression test. Knee and ankle jerk are 1+ and symmetrical. Normal knee range of motion no crepitus. No rash overexposed skin distal pulses are 2+. No plantar foot lesions. She is able to heel and toe walk with a very slow deliberate gait. She gets on and off exam table slowly she unloads her lumbar spine with the elbows extended and keeping her fists on the table.  Neurological: She is alert and oriented to person, place, and time.  Skin: Skin is warm and dry.  Psychiatric: She has a normal mood and affect. Her behavior is normal.    Ortho Exam  Specialty  Comments:  No specialty comments available.  Imaging: No results found.   PMFS History: Patient Active Problem List   Diagnosis Date Noted  . Chronic bilateral low back pain with right-sided sciatica 09/23/2016  . Pelvic pain 09/23/2016   No past medical history on file.  No family history on file.  No past surgical history on file. Social History   Occupational History  . Not on file.   Social History Main Topics  . Smoking status: Never Smoker  . Smokeless tobacco: Never Used  . Alcohol use Not on file  . Drug use: Unknown  . Sexual activity: Not on file

## 2016-11-12 NOTE — Addendum Note (Signed)
Addended by: Meyer Cory on: 11/12/2016 09:23 AM   Modules accepted: Orders

## 2016-11-21 ENCOUNTER — Telehealth (INDEPENDENT_AMBULATORY_CARE_PROVIDER_SITE_OTHER): Payer: Self-pay | Admitting: Orthopedic Surgery

## 2016-11-21 NOTE — Telephone Encounter (Signed)
Kathleen Nguyen called in to the triage line to ask if her MRI had been scheduled yet.  I gave her in the information in that was in her chart, MRI appointment for tomorrow 11/22/16 arrive at 4:45pm [facility].  She then asked me if the hospital would do a pregnancy test before she had her imaging.  I advised that she needed to inform the technologist at the time of the appointment that there is a chance she might be pregnant.  Also discussed that she could purchase a pregnancy test at her local pharmacy if she wanted to know before going to the MRI appointment. I am unsure if the MRI technologist will order a test at the time of the appointment or just cancel her MRI until she can confirm/deny pregnancy.

## 2016-11-22 ENCOUNTER — Emergency Department (HOSPITAL_COMMUNITY): Admission: EM | Admit: 2016-11-22 | Payer: Self-pay | Source: Home / Self Care

## 2016-11-22 ENCOUNTER — Ambulatory Visit (HOSPITAL_COMMUNITY)
Admission: RE | Admit: 2016-11-22 | Discharge: 2016-11-22 | Disposition: A | Discharge status: Home / Self Care | Payer: Self-pay | Source: Ambulatory Visit | Attending: Orthopaedic Surgery | Admitting: Orthopaedic Surgery

## 2016-11-22 DIAGNOSIS — G8929 Other chronic pain: Secondary | ICD-10-CM

## 2016-11-22 DIAGNOSIS — M4696 Unspecified inflammatory spondylopathy, lumbar region: Secondary | ICD-10-CM | POA: Insufficient documentation

## 2016-11-22 DIAGNOSIS — M545 Low back pain: Secondary | ICD-10-CM

## 2016-11-26 ENCOUNTER — Telehealth (INDEPENDENT_AMBULATORY_CARE_PROVIDER_SITE_OTHER): Payer: Self-pay | Admitting: Orthopaedic Surgery

## 2016-11-26 NOTE — Telephone Encounter (Signed)
I called and discussed MRI shows mild facet changes in the lumbar spine without evidence of compression. She can follow-up with her primary care physician. No evidence of nerve root compression centrally, lateral recess and no foraminal compression found.FYI

## 2016-11-26 NOTE — Telephone Encounter (Signed)
noted 

## 2016-11-26 NOTE — Telephone Encounter (Signed)
Please advise.  Patient has follow up appt scheduled for 6/19 to review MRI.

## 2016-11-26 NOTE — Telephone Encounter (Signed)
Patient called asked if Dr Lorin Mercy can call her with the results of her MRI. Patient said she is in a lot of pain. The number to contact patient is [phone]

## 2016-12-05 ENCOUNTER — Ambulatory Visit (INDEPENDENT_AMBULATORY_CARE_PROVIDER_SITE_OTHER): Payer: Self-pay | Admitting: Physician Assistant

## 2016-12-05 ENCOUNTER — Encounter (INDEPENDENT_AMBULATORY_CARE_PROVIDER_SITE_OTHER): Payer: Self-pay | Admitting: Physician Assistant

## 2016-12-05 VITALS — BP 102/69 | HR 77 | Temp 97.7°F | Wt 147.0 lb

## 2016-12-05 DIAGNOSIS — G8929 Other chronic pain: Secondary | ICD-10-CM

## 2016-12-05 DIAGNOSIS — K0889 Other specified disorders of teeth and supporting structures: Secondary | ICD-10-CM

## 2016-12-05 DIAGNOSIS — N912 Amenorrhea, unspecified: Secondary | ICD-10-CM

## 2016-12-05 DIAGNOSIS — M5441 Lumbago with sciatica, right side: Secondary | ICD-10-CM

## 2016-12-05 DIAGNOSIS — R935 Abnormal findings on diagnostic imaging of other abdominal regions, including retroperitoneum: Secondary | ICD-10-CM

## 2016-12-05 DIAGNOSIS — B353 Tinea pedis: Secondary | ICD-10-CM

## 2016-12-05 LAB — POCT URINE PREGNANCY: Preg Test, Ur: NEGATIVE

## 2016-12-05 MED ORDER — GABAPENTIN 300 MG PO CAPS: 300.0000 mg | ORAL_CAPSULE | Freq: Three times a day (TID) | ORAL | 3 refills | Status: DC

## 2016-12-05 MED ORDER — ACETAMINOPHEN-CODEINE #3 300-30 MG PO TABS: 1.0000 | ORAL_TABLET | Freq: Three times a day (TID) | ORAL | 0 refills | Status: AC | PRN

## 2016-12-05 MED ORDER — CLOTRIMAZOLE 1 % EX CREA: 1.0000 "application " | TOPICAL_CREAM | Freq: Two times a day (BID) | CUTANEOUS | 0 refills | Status: DC

## 2016-12-05 NOTE — Patient Instructions (Signed)
Sciatica Sciatica is pain, numbness, weakness, or tingling along the path of the sciatic nerve. The sciatic nerve starts in the lower back and runs down the back of each leg. The nerve controls the muscles in the lower leg and in the back of the knee. It also provides feeling (sensation) to the back of the thigh, the lower leg, and the sole of the foot. Sciatica is a symptom of another medical condition that pinches or puts pressure on the sciatic nerve. Generally, sciatica only affects one side of the body. Sciatica usually goes away on its own or with treatment. In some cases, sciatica may keep coming back (recur). What are the causes? This condition is caused by pressure on the sciatic nerve, or pinching of the sciatic nerve. This may be the result of:  A disk in between the bones of the spine (vertebrae) bulging out too far (herniated disk).  Age-related changes in the spinal disks (degenerative disk disease).  A pain disorder that affects a muscle in the buttock (piriformis syndrome).  Extra bone growth (bone spur) near the sciatic nerve.  An injury or break (fracture) of the pelvis.  Pregnancy.  Tumor (rare). What increases the risk? The following factors may make you more likely to develop this condition:  Playing sports that place pressure or stress on the spine, such as football or weight lifting.  Having poor strength and flexibility.  A history of back injury.  A history of back surgery.  Sitting for long periods of time.  Doing activities that involve repetitive bending or lifting.  Obesity. What are the signs or symptoms? Symptoms can vary from mild to very severe, and they may include:  Any of these problems in the lower back, leg, hip, or buttock:  Mild tingling or dull aches.  Burning sensations.  Sharp pains.  Numbness in the back of the calf or the sole of the foot.  Leg weakness.  Severe back pain that makes movement difficult. These symptoms may  get worse when you cough, sneeze, or laugh, or when you sit or stand for long periods of time. Being overweight may also make symptoms worse. In some cases, symptoms may recur over time. How is this diagnosed? This condition may be diagnosed based on:  Your symptoms.  A physical exam. Your health care provider may ask you to do certain movements to check whether those movements trigger your symptoms.  You may have tests, including:  Blood tests.  X-rays.  MRI.  CT scan. How is this treated? In many cases, this condition improves on its own, without any treatment. However, treatment may include:  Reducing or modifying physical activity during periods of pain.  Exercising and stretching to strengthen your abdomen and improve the flexibility of your spine.  Icing and applying heat to the affected area.  Medicines that help:  To relieve pain and swelling.  To relax your muscles.  Injections of medicines that help to relieve pain, irritation, and inflammation around the sciatic nerve (steroids).  Surgery. Follow these instructions at home: Medicines   Take over-the-counter and prescription medicines only as told by your health care provider.  Do not drive or operate heavy machinery while taking prescription pain medicine. Managing pain   If directed, apply ice to the affected area.  Put ice in a plastic bag.  Place a towel between your skin and the bag.  Leave the ice on for 20 minutes, 2-3 times a day.  After icing, apply heat to the   affected area before you exercise or as often as told by your health care provider. Use the heat source that your health care provider recommends, such as a moist heat pack or a heating pad.  Place a towel between your skin and the heat source.  Leave the heat on for 20-30 minutes.  Remove the heat if your skin turns bright red. This is especially important if you are unable to feel pain, heat, or cold. You may have a greater risk of  getting burned. Activity   Return to your normal activities as told by your health care provider. Ask your health care provider what activities are safe for you.  Avoid activities that make your symptoms worse.  Take brief periods of rest throughout the day. Resting in a lying or standing position is usually better than sitting to rest.  When you rest for longer periods, mix in some mild activity or stretching between periods of rest. This will help to prevent stiffness and pain.  Avoid sitting for long periods of time without moving. Get up and move around at least one time each hour.  Exercise and stretch regularly, as told by your health care provider.  Do not lift anything that is heavier than 10 lb (4.5 kg) while you have symptoms of sciatica. When you do not have symptoms, you should still avoid heavy lifting, especially repetitive heavy lifting.  When you lift objects, always use proper lifting technique, which includes:  Bending your knees.  Keeping the load close to your body.  Avoiding twisting. General instructions   Use good posture.  Avoid leaning forward while sitting.  Avoid hunching over while standing.  Maintain a healthy weight. Excess weight puts extra stress on your back and makes it difficult to maintain good posture.  Wear supportive, comfortable shoes. Avoid wearing high heels.  Avoid sleeping on a mattress that is too soft or too hard. A mattress that is firm enough to support your back when you sleep may help to reduce your pain.  Keep all follow-up visits as told by your health care provider. This is important. Contact a health care provider if:  You have pain that wakes you up when you are sleeping.  You have pain that gets worse when you lie down.  Your pain is worse than you have experienced in the past.  Your pain lasts longer than 4 weeks.  You experience unexplained weight loss. Get help right away if:  You lose control of your bowel  or bladder (incontinence).  You have:  Weakness in your lower back, pelvis, buttocks, or legs that gets worse.  Redness or swelling of your back.  A burning sensation when you urinate. This information is not intended to replace advice given to you by your health care provider. Make sure you discuss any questions you have with your health care provider. Document Released: 06/04/2001 Document Revised: 11/14/2015 Document Reviewed: 02/17/2015 Elsevier Interactive Patient Education  2017 [employer]  

## 2016-12-05 NOTE — Progress Notes (Signed)
Subjective:  Patient ID: Kathleen Nguyen, female    DOB: 20-Jul-1985  Age: 31 y.o. MRN: [id number]  CC: back pain and f/u MRI HPI Kathleen Nguyen is a 31 y.o. female with chronic LBP with sciatica presents on f/u of back pain and would like to know her MRI results. MRI on 11/22/16 revealed mild facet hypertrophy at L2-L3 and L4-L5. Has attended 8 sessions of physical therapy without relief of pain. Has not taken gabapentin as directed due to drowsiness but feels some relief the times that she has taken Gabapentin. Says Tramadol is not helpful and makes her drowsy. She relates extreme pain in the lumbar spine. Requests a letter be written for her apartment manager that states she should have a first floor apartment due to her back pain. She is interested in progressing further with evaluation and treatment. Denies paralysis, saddle paresthesia, urinary dysfunction, or bowel dysfunction.   Outpatient Medications Prior to Visit  Medication Sig Dispense Refill  . cyclobenzaprine (FLEXERIL) 5 MG tablet Take 1 tablet (5 mg total) by mouth 3 (three) times daily as needed for muscle spasms. 30 tablet 1  . diphenhydrAMINE (BENADRYL) 25 MG tablet Take 1 tablet (25 mg total) by mouth 2 (two) times daily. 30 tablet 0  . medroxyPROGESTERone (DEPO-PROVERA) 150 MG/ML injection Inject 150 mg into the muscle every 3 (three) months.    . naproxen (NAPROSYN) 500 MG tablet Take 1 tablet (500 mg total) by mouth 2 (two) times daily with a meal. 14 tablet 1  . gabapentin (NEURONTIN) 300 MG capsule Take 1 capsule (300 mg total) by mouth 3 (three) times daily. 90 capsule 3   No facility-administered medications prior to visit.      ROS Review of Systems  Constitutional: Negative for chills, fever and malaise/fatigue.  Eyes: Negative for blurred vision.  Respiratory: Negative for shortness of breath.   Cardiovascular: Negative for chest pain and palpitations.  Gastrointestinal: Negative for abdominal pain and nausea.   Genitourinary: Negative for dysuria and hematuria.       Amenorrhea  Musculoskeletal: Positive for back pain. Negative for joint pain and myalgias.  Skin: Positive for rash.  Neurological: Negative for tingling and headaches.  Psychiatric/Behavioral: Negative for depression. The patient is not nervous/anxious.     Objective:  BP 102/69 (BP Location: Left Arm, Patient Position: Sitting, Cuff Size: Normal)   Pulse 77   Temp 97.7 F (36.5 C) (Oral)   Wt 147 lb (66.7 kg)   LMP 10/05/2016 (Exact Date)   SpO2 100%   BMI 25.23 kg/m   BP/Weight 12/05/2016 11/22/2016 5/80/9983  Systolic BP 382 505 397  Diastolic BP 69 66 71  Wt. (Lbs) 147 - 146  BMI 25.23 - 25.06      Physical Exam  Constitutional: She is oriented to person, place, and time.  Well developed, well nourished, NAD, polite  HENT:  Head: Normocephalic and atraumatic.  Eyes: No scleral icterus.  Neck: Normal range of motion.  Cardiovascular: Normal rate, regular rhythm and normal heart sounds.   Pulmonary/Chest: Effort normal and breath sounds normal.  Neurological: She is alert and oriented to person, place, and time. No cranial nerve deficit. Coordination normal.  Skin: Skin is warm and dry.  Dorsum of left forefoot with a circular patch of mild erythema, scaling, and with raised borders.  Psychiatric: She has a normal mood and affect. Her behavior is normal. Thought content normal.  Vitals reviewed.    Assessment & Plan:   1. Chronic bilateral low back  pain with right-sided sciatica - Begin gabapentin (NEURONTIN) 300 MG capsule; Take 1 capsule (300 mg total) by mouth 3 (three) times daily.  Dispense: 90 capsule; Refill: 3 - Begin acetaminophen-codeine (TYLENOL #3) 300-30 MG tablet; Take 1 tablet by mouth every 8 (eight) hours as needed for moderate pain.  Dispense: 90 tablet; Refill: 0  2. Missed period - POCT urine pregnancy negative in clinic today  3. Tinea pedis of left foot - Begin clotrimazole  (LOTRIMIN) 1 % cream; Apply 1 application topically 2 (two) times daily.  Dispense: 30 g; Refill: 0  4. Amenorrhea - FSH/LH, Prolactin - POCT urine pregnancy negative in clinic today  5. Abnormal Pelvic US - MR pelvis w/wo contrast    Meds ordered this encounter  Medications  . gabapentin (NEURONTIN) 300 MG capsule    Sig: Take 1 capsule (300 mg total) by mouth 3 (three) times daily.    Dispense:  90 capsule    Refill:  3    Order Specific Question:   Supervising Provider    Answer:   Tresa Garter [id number]  . acetaminophen-codeine (TYLENOL #3) 300-30 MG tablet    Sig: Take 1 tablet by mouth every 8 (eight) hours as needed for moderate pain.    Dispense:  90 tablet    Refill:  0    Order Specific Question:   Supervising Provider    Answer:   Tresa Garter [id number]  . clotrimazole (LOTRIMIN) 1 % cream    Sig: Apply 1 application topically 2 (two) times daily.    Dispense:  30 g    Refill:  0    Order Specific Question:   Supervising Provider    Answer:   Tresa Garter [id number]    Follow-up: Return in about 4 weeks (around 01/02/2017).   Clent Demark PA

## 2016-12-06 LAB — AMENORRHEA PROFILE
FSH: 8.3 m[IU]/mL
LH: 13.8 m[IU]/mL
Prolactin: 10 ng/mL (ref 4.8–23.3)

## 2016-12-10 ENCOUNTER — Encounter (INDEPENDENT_AMBULATORY_CARE_PROVIDER_SITE_OTHER): Payer: Self-pay | Admitting: Orthopaedic Surgery

## 2016-12-10 ENCOUNTER — Ambulatory Visit (HOSPITAL_COMMUNITY)
Admission: RE | Admit: 2016-12-10 | Discharge: 2016-12-10 | Disposition: A | Discharge status: Home / Self Care | Payer: Self-pay | Source: Ambulatory Visit | Attending: Physician Assistant | Admitting: Physician Assistant

## 2016-12-10 ENCOUNTER — Ambulatory Visit (INDEPENDENT_AMBULATORY_CARE_PROVIDER_SITE_OTHER): Payer: Self-pay | Admitting: Orthopaedic Surgery

## 2016-12-10 VITALS — Ht 64.0 in | Wt 147.0 lb

## 2016-12-10 DIAGNOSIS — M545 Low back pain, unspecified: Secondary | ICD-10-CM

## 2016-12-10 DIAGNOSIS — R938 Abnormal findings on diagnostic imaging of other specified body structures: Secondary | ICD-10-CM | POA: Insufficient documentation

## 2016-12-10 DIAGNOSIS — R935 Abnormal findings on diagnostic imaging of other abdominal regions, including retroperitoneum: Secondary | ICD-10-CM | POA: Insufficient documentation

## 2016-12-10 DIAGNOSIS — N858 Other specified noninflammatory disorders of uterus: Secondary | ICD-10-CM | POA: Insufficient documentation

## 2016-12-10 LAB — CREATININE, SERUM
Creatinine, Ser: 0.51 mg/dL (ref 0.44–1.00)
GFR calc Af Amer: >60 mL/min (ref >60)
GFR calc non Af Amer: >60 mL/min (ref >60)

## 2016-12-10 MED ORDER — GADOBENATE DIMEGLUMINE 529 MG/ML IV SOLN
15.0000 mL | Freq: Once | INTRAVENOUS | Status: AC | PRN
  Administered 2016-12-10: 15 mL via INTRAVENOUS

## 2016-12-10 MED FILL — ACETAMINOPHEN/COD #3 TABLET: 300-30 | 30 days supply | Qty: 90 | Fill #0

## 2016-12-10 MED FILL — GABAPENTIN 300 MG CAPSULE: 300 | 30 days supply | Qty: 90 | Fill #0

## 2016-12-10 NOTE — Progress Notes (Signed)
Office Visit Note   Patient: Kathleen Nguyen           Date of Birth: [birth year]           MRN: [id number] Visit Date: 12/10/2016              Requested by: Clent Demark, PA-C [place], [place] [address] PCP: Clent Demark, PA-C   Assessment & Plan: Visit Diagnoses: Low back pain. Normal MRI and normal neurologic exam.  Plan: She has an MRI of her pelvis ordered by another provider. Lab work pending for workup. This may be GYN related. I discussed with her I recommend a walking program and she discusses she really doesn't like to walk. I discussed with her that I think her symptoms would improve with walking program she can consider this.  Follow-Up Instructions: Return if symptoms worsen or fail to improve.   Orders:  No orders of the defined types were placed in this encounter.  No orders of the defined types were placed in this encounter.     Procedures: No procedures performed   Clinical Data: No additional findings.   Subjective: Chief Complaint  Patient presents with  . Lower Back - Pain    HPI 31 year old female returns with complaint of ongoing back pain. She was on the third floor and she states that she needs something so she can get a first floor apartment. She has an MRI of the pelvis pending. MRI scan lumbar spine was obtained with her past history of having a previous MRI that showed a disc problem when she was in [place].  MRI scan was obtained on 11/22/2016 and is available for review. Patient is here with an interpreter from [place]. She is placed on gabapentin 3 times a day also some Tylenol 3 and has an MRI scan of the pelvis pending. She denies fever chills no associated bowel or bladder symptoms. Recent pregnancy test was negative on 12/05/2016. She has had amenorrhea. Lab work is pending for this.  Review of Systems 14 point review of systems is updated and is unchanged from office visit 11/12/2016 other than as mentioned in history  of present illness.   Objective: Vital Signs: Ht 5\' 4"  (1.626 m)   Wt 147 lb (66.7 kg)   BMI 25.23 kg/m   Physical Exam  Constitutional: She is oriented to person, place, and time. She appears well-developed.  HENT:  Head: Normocephalic.  Right Ear: External ear normal.  Left Ear: External ear normal.  Eyes: Pupils are equal, round, and reactive to light.  Neck: No tracheal deviation present. No thyromegaly present.  Cardiovascular: Normal rate.   Pulmonary/Chest: Effort normal.  Abdominal: Soft.  Musculoskeletal:  Patient's able to ambulate she can heel and toe walk. No pitting edema in the lower extremities. Normal hip range of motion no knee effusion. No rash over exposed skin.  Neurological: She is alert and oriented to person, place, and time.  Skin: Skin is warm and dry.  Psychiatric: She has a normal mood and affect. Her behavior is normal.    Ortho Exam  Specialty Comments:  No specialty comments available.  Imaging:Study Result   CLINICAL DATA:  Initial evaluation for chronic midline low back pain.  EXAM: MRI LUMBAR SPINE WITHOUT CONTRAST  TECHNIQUE: Multiplanar, multisequence MR imaging of the lumbar spine was performed. No intravenous contrast was administered.  COMPARISON:  Prior radiograph from 09/02/2016.  FINDINGS: Segmentation: Normal segmentation. Lowest well-formed disc is labeled the  L5-S1 level.  Alignment: Vertebral bodies normally aligned with preservation of the normal lumbar lordosis. No listhesis.  Vertebrae: Vertebral body heights are maintained. No evidence for acute or chronic fracture. Mild reactive endplate changes related to Schmorl's nodes present at the superior endplates of L4 and L5 vessels the inferior endplate of L5. Signal intensity within the vertebral body bone marrow otherwise normal. No discrete or worrisome osseous lesions. No abnormal marrow edema.  Conus medullaris: Extends to the I level and appears  normal.  Paraspinal and other soft tissues: Paraspinous soft tissues within normal limits. Visualized visceral structures unremarkable.  Disc levels:  L1-2:  Unremarkable.  L2-3:  Unremarkable.  L3-4: Normal interspace. Mild facet and ligamentum flavum hypertrophy. No stenosis.  L4-5: Normal interspace. Mild facet and ligamentum flavum hypertrophy. No stenosis.  L5-S1:  Unremarkable.  IMPRESSION: 1. Mild bilateral facet hypertrophy at L3-4 and L4-5. 2. Otherwise unremarkable MRI of the lumbar spine. No significant degenerative disc disease. No stenosis or neural impingement.   Electronically Signed   By: Jeannine Boga M.D.   On: 11/22/2016 22:35       PMFS History: Patient Active Problem List   Diagnosis Date Noted  . Tinea pedis of left foot 12/05/2016  . Pelvic pain 09/23/2016   No past medical history on file.  No family history on file.  No past surgical history on file. Social History   Occupational History  . Not on file.   Social History Main Topics  . Smoking status: Never Smoker  . Smokeless tobacco: Never Used  . Alcohol use Not on file  . Drug use: Unknown  . Sexual activity: Not on file

## 2016-12-11 ENCOUNTER — Other Ambulatory Visit (INDEPENDENT_AMBULATORY_CARE_PROVIDER_SITE_OTHER): Payer: Self-pay | Admitting: Physician Assistant

## 2016-12-11 ENCOUNTER — Telehealth (INDEPENDENT_AMBULATORY_CARE_PROVIDER_SITE_OTHER): Payer: Self-pay | Admitting: Physician Assistant

## 2016-12-11 DIAGNOSIS — D259 Leiomyoma of uterus, unspecified: Secondary | ICD-10-CM | POA: Insufficient documentation

## 2016-12-11 DIAGNOSIS — D269 Other benign neoplasm of uterus, unspecified: Secondary | ICD-10-CM

## 2016-12-11 NOTE — Telephone Encounter (Signed)
Patient has been contacted. Nat Christen, CMA

## 2016-12-11 NOTE — Telephone Encounter (Signed)
Patient called back stated got message ,  Would like to get lab results. Please call with interpreter.

## 2016-12-31 ENCOUNTER — Ambulatory Visit (INDEPENDENT_AMBULATORY_CARE_PROVIDER_SITE_OTHER): Payer: Self-pay | Admitting: Physician Assistant

## 2017-01-09 ENCOUNTER — Ambulatory Visit (INDEPENDENT_AMBULATORY_CARE_PROVIDER_SITE_OTHER): Payer: Self-pay | Admitting: Physician Assistant

## 2017-01-09 ENCOUNTER — Encounter (INDEPENDENT_AMBULATORY_CARE_PROVIDER_SITE_OTHER): Payer: Self-pay | Admitting: Physician Assistant

## 2017-01-09 VITALS — BP 112/73 | HR 75 | Temp 98.3°F | Wt 153.2 lb

## 2017-01-09 DIAGNOSIS — Z029 Encounter for administrative examinations, unspecified: Secondary | ICD-10-CM

## 2017-01-09 DIAGNOSIS — G8929 Other chronic pain: Secondary | ICD-10-CM

## 2017-01-09 DIAGNOSIS — E282 Polycystic ovarian syndrome: Secondary | ICD-10-CM

## 2017-01-09 DIAGNOSIS — D269 Other benign neoplasm of uterus, unspecified: Secondary | ICD-10-CM

## 2017-01-09 DIAGNOSIS — B353 Tinea pedis: Secondary | ICD-10-CM

## 2017-01-09 DIAGNOSIS — M5441 Lumbago with sciatica, right side: Secondary | ICD-10-CM

## 2017-01-09 MED ORDER — GABAPENTIN 300 MG PO CAPS: 300.0000 mg | ORAL_CAPSULE | Freq: Three times a day (TID) | ORAL | 3 refills | Status: DC

## 2017-01-09 MED ORDER — METFORMIN HCL 500 MG PO TABS: 500.0000 mg | ORAL_TABLET | Freq: Three times a day (TID) | ORAL | 5 refills | Status: DC

## 2017-01-09 MED ORDER — CLOTRIMAZOLE 1 % EX CREA: 1.0000 "application " | TOPICAL_CREAM | Freq: Two times a day (BID) | CUTANEOUS | 1 refills | Status: DC

## 2017-01-09 MED ORDER — METFORMIN HCL 500 MG PO TABS
500.0000 mg | ORAL_TABLET | Freq: Three times a day (TID) | ORAL | 5 refills | Status: DC
Start: 2017-01-09 — End: 2017-12-11

## 2017-01-09 MED ORDER — ACETAMINOPHEN-CODEINE #3 300-30 MG PO TABS: 1.0000 | ORAL_TABLET | ORAL | 0 refills | Status: AC | PRN

## 2017-01-09 MED FILL — ?METFORMIN HCL 500MG TABLET: 500 | 30 days supply | Qty: 90 | Fill #0

## 2017-01-09 NOTE — Progress Notes (Signed)
Subjective:  Patient ID: Kathleen Nguyen, female    DOB: 08/28/1985  Age: 31 y.o. MRN: [id number]  CC: f/u back pain  HPI Kathleen Nguyen is a 31 y.o. female with a PMH of LBP w/sciatica presents on f/u for the same. MRI on 11/22/16 revealed mild facet hypertrophy at L2-L3 and L4-L5. Has attended 8 sessions of physical therapy without relief of pain. Feels that LBP is improved by 90% when she takes Gabapentin and Tylenol #3. Has an upcoming orthopedic appointment to discuss her back pain.    States she also has amenorrhea x 4 months. Last used depo-provera 04/2016. MR pelvis revealed multiple bilateral ovarian follicles likely consistent with PCOS. Has an upcoming GYN appointment to discuss her amenorrhea.    Says her left foot itching and rash continues. Was not able to buy Clotrimazole due to price at [facility]. Bought an alternative and was only minimally helpful.     Brings paperwork from her apartment complex to be filled out for justification to move to a first floor apartment.     Outpatient Medications Prior to Visit  Medication Sig Dispense Refill  . diphenhydrAMINE (BENADRYL) 25 MG tablet Take 1 tablet (25 mg total) by mouth 2 (two) times daily. 30 tablet 0  . naproxen (NAPROSYN) 500 MG tablet Take 1 tablet (500 mg total) by mouth 2 (two) times daily with a meal. 14 tablet 1  . clotrimazole (LOTRIMIN) 1 % cream Apply 1 application topically 2 (two) times daily. 30 g 0  . cyclobenzaprine (FLEXERIL) 5 MG tablet Take 1 tablet (5 mg total) by mouth 3 (three) times daily as needed for muscle spasms. 30 tablet 1  . gabapentin (NEURONTIN) 300 MG capsule Take 1 capsule (300 mg total) by mouth 3 (three) times daily. 90 capsule 3  . medroxyPROGESTERone (DEPO-PROVERA) 150 MG/ML injection Inject 150 mg into the muscle every 3 (three) months.     No facility-administered medications prior to visit.      ROS Review of Systems  Constitutional: Negative for chills, fever and malaise/fatigue.  Eyes:  Negative for blurred vision.  Respiratory: Negative for shortness of breath.   Cardiovascular: Negative for chest pain and palpitations.  Gastrointestinal: Negative for abdominal pain and nausea.  Genitourinary: Negative for dysuria and hematuria.  Musculoskeletal: Negative for joint pain and myalgias.  Skin: Negative for rash.  Neurological: Negative for tingling and headaches.  Psychiatric/Behavioral: Negative for depression. The patient is not nervous/anxious.     Objective:  BP 112/73 (BP Location: Left Arm, Patient Position: Sitting, Cuff Size: Normal)   Pulse 75   Temp 98.3 F (36.8 C) (Oral)   Wt 153 lb 3.2 oz (69.5 kg)   LMP 10/05/2016   SpO2 99%   BMI 26.30 kg/m   BP/Weight 01/09/2017 12/10/2016 1/51/7616  Systolic BP 073 - 710  Diastolic BP 73 - 69  Wt. (Lbs) 153.2 147 147  BMI 26.3 25.23 25.23      Physical Exam  Constitutional:  Well developed, well nourished, NAD, polite  HENT:  Head: Normocephalic and atraumatic.  Eyes: No scleral icterus.  Skin: Skin is warm and dry.  Psychiatric: She has a normal mood and affect. Her behavior is normal. Thought content normal.  Vitals reviewed.    Assessment & Plan:   1. PCOS (polycystic ovarian syndrome) - Begin metFORMIN (GLUCOPHAGE) 500 MG tablet; Take 1 tablet (500 mg total) by mouth 3 (three) times daily.  Dispense: 90 tablet; Refill: 5  2. Uterine adenomyoma - Keep appointment with GYN  3. Chronic bilateral low back pain with right-sided sciatica - Refill gabapentin (NEURONTIN) 300 MG capsule; Take 1 capsule (300 mg total) by mouth 3 (three) times daily.  Dispense: 90 capsule; Refill: 3 - Refill acetaminophen-codeine (TYLENOL #3) 300-30 MG tablet; Take 1 tablet by mouth every 4 (four) hours as needed for moderate pain.  Dispense: 42 tablet; Refill: 0 - Keep appointment with [facility]  4. Tinea pedis of left foot - Refill clotrimazole (LOTRIMIN) 1 % cream; Apply 1 application topically 2 (two) times daily.   Dispense: 30 g; Refill: 1.  5. Administrative encounter - 10 extra minutes spent reviewing and filling out paperwork for her apartment complex accomodation request.    Meds ordered this encounter  Medications  . DISCONTD: metFORMIN (GLUCOPHAGE) 500 MG tablet    Sig: Take 1 tablet (500 mg total) by mouth 3 (three) times daily.    Dispense:  90 tablet    Refill:  5    Order Specific Question:   Supervising Provider    Answer:   Tresa Garter [id number]  . gabapentin (NEURONTIN) 300 MG capsule    Sig: Take 1 capsule (300 mg total) by mouth 3 (three) times daily.    Dispense:  90 capsule    Refill:  3    Order Specific Question:   Supervising Provider    Answer:   Tresa Garter [id number]  . acetaminophen-codeine (TYLENOL #3) 300-30 MG tablet    Sig: Take 1 tablet by mouth every 4 (four) hours as needed for moderate pain.    Dispense:  42 tablet    Refill:  0    Order Specific Question:   Supervising Provider    Answer:   Tresa Garter [id number]  . clotrimazole (LOTRIMIN) 1 % cream    Sig: Apply 1 application topically 2 (two) times daily.    Dispense:  30 g    Refill:  1    Order Specific Question:   Supervising Provider    Answer:   Tresa Garter [id number]  . metFORMIN (GLUCOPHAGE) 500 MG tablet    Sig: Take 1 tablet (500 mg total) by mouth 3 (three) times daily.    Dispense:  90 tablet    Refill:  5    Order Specific Question:   Supervising Provider    Answer:   Tresa Garter [id number]    Follow-up: Return if symptoms worsen or fail to improve.   Clent Demark PA

## 2017-01-09 NOTE — Patient Instructions (Signed)
Athlete's Foot Athlete's foot (tinea pedis) is a fungal infection of the skin on the feet. It often occurs on the skin that is between or underneath the toes. It can also occur on the soles of the feet. The infection can spread from person to person (is contagious). What are the causes? Athlete's foot is caused by a fungus. This fungus grows in warm, moist places. Most people get athlete's foot by sharing shower stalls, towels, and wet floors with someone who is infected. Not washing your feet or changing your socks often enough can contribute to athlete's foot. What increases the risk? This condition is more likely to develop in:  Men.  People who have a weak body defense system (immune system).  People who have diabetes.  People who use public showers, such as at a gym.  People who wear heavy-duty shoes, such as industrial or military shoes.  Seasons with warm, humid weather.  What are the signs or symptoms? Symptoms of this condition include:  Itchy areas between the toes or on the soles of the feet.  White, flaky, or scaly areas between the toes or on the soles of the feet.  Very itchy small blisters between the toes or on the soles of the feet.  Small cuts on the skin. These cuts can become infected.  Thick or discolored toenails.  How is this diagnosed? This condition is diagnosed with a medical history and physical exam. Your health care provider may also take a skin or toenail sample to be examined. How is this treated? Treatment for this condition includes antifungal medicines. These may be applied as powders, ointments, or creams. In severe cases, an oral antifungal medicine may be given. Follow these instructions at home:  Apply or take over-the-counter and prescription medicines only as told by your health care provider.  Keep all follow-up visits as told by your health care provider. This is important.  Do not scratch your feet.  Keep your feet dry: ? Wear  cotton or wool socks. Change your socks every day or if they become wet. ? Wear shoes that allow air to circulate, such as sandals or canvas tennis shoes.  Wash and dry your feet: ? Every day or as told by your health care provider. ? After exercising. ? Including the area between your toes.  Do not share towels, nail clippers, or other personal items that touch your feet with others.  If you have diabetes, keep your blood sugar under control. How is this prevented?  Do not share towels.  Wear sandals in wet areas, such as locker rooms and shared showers.  Keep your feet dry: ? Wear cotton or wool socks. Change your socks every day or if they become wet. ? Wear shoes that allow air to circulate, such as sandals or canvas tennis shoes.  Wash and dry your feet after exercising. Pay attention to the area between your toes. Contact a health care provider if:  You have a fever.  You have swelling, soreness, warmth, or redness in your foot.  You are not getting better with treatment.  Your symptoms get worse.  You have new symptoms. This information is not intended to replace advice given to you by your health care provider. Make sure you discuss any questions you have with your health care provider. Document Released: 06/07/2000 Document Revised: 11/16/2015 Document Reviewed: 12/12/2014 Elsevier Interactive Patient Education  2018 Elsevier Inc.  

## 2017-01-13 ENCOUNTER — Encounter: Payer: Self-pay | Admitting: Obstetrics & Gynecology

## 2017-01-13 ENCOUNTER — Ambulatory Visit (INDEPENDENT_AMBULATORY_CARE_PROVIDER_SITE_OTHER): Payer: Self-pay | Admitting: Obstetrics & Gynecology

## 2017-01-13 VITALS — BP 104/67 | HR 87

## 2017-01-13 DIAGNOSIS — Z124 Encounter for screening for malignant neoplasm of cervix: Secondary | ICD-10-CM

## 2017-01-13 DIAGNOSIS — Z0000 Encounter for general adult medical examination without abnormal findings: Secondary | ICD-10-CM

## 2017-01-13 DIAGNOSIS — R102 Pelvic and perineal pain: Secondary | ICD-10-CM

## 2017-01-13 DIAGNOSIS — Z1151 Encounter for screening for human papillomavirus (HPV): Secondary | ICD-10-CM

## 2017-01-13 NOTE — Progress Notes (Signed)
   Subjective:    Patient ID: Kathleen Nguyen, female    DOB: [birth year], 31 y.o.   MRN: [id number]  HPI 31 yo MP2 from [place] here for discussion of her MRI, ordered by her primary doctor for evaluation of pelvic pain. She was found to have PCOs and adenomyosis and adenomyoma. She says that her pain is "medium". She uses Tylenol. She reports occasional dyspareunia.  She does not have regular periods. Her LMP was 4/18. She would like another pregnancy and is not using contraception.  She was prescribed metformin by her primary care MD but she hasn't picked it up from the pharmacy yet. She is aware that this will help her conceive.    Review of Systems She is due for a pap smear.    Objective:   Physical Exam Interpretor used for exam [id number] Breathing, conversing, and ambulating normally Abd- benign Cervix- parous NSSmid plane, NT, no adnexal masses or tenderness      Assessment & Plan:  Desire for pregnancy- rec MVI daily to help prevent ONTDs Rec that she start the metformin. Pelvic pain- rec tylenol/ibu Preventative care- pap smear with cotesting today

## 2017-01-13 NOTE — Progress Notes (Signed)
Interpreter Leary Roca

## 2017-01-15 LAB — CYTOLOGY - PAP
DIAGNOSIS: NEGATIVE
HPV (WINDOPATH): NOT DETECTED

## 2017-03-21 ENCOUNTER — Other Ambulatory Visit (INDEPENDENT_AMBULATORY_CARE_PROVIDER_SITE_OTHER): Payer: Self-pay | Admitting: Physician Assistant

## 2017-03-21 NOTE — Telephone Encounter (Signed)
FWD to PCP. Tempestt S Roberts, CMA  

## 2017-03-24 MED FILL — GABAPENTIN 300 MG CAPSULE: 300 | 30 days supply | Qty: 90 | Fill #1

## 2017-04-09 ENCOUNTER — Ambulatory Visit (INDEPENDENT_AMBULATORY_CARE_PROVIDER_SITE_OTHER): Payer: Self-pay | Admitting: Orthopaedic Surgery

## 2017-05-12 MED FILL — GABAPENTIN 300 MG CAPSULE: 300 | 30 days supply | Qty: 90 | Fill #2

## 2017-05-12 MED FILL — ?METFORMIN HCL 500MG TABLET: 500 | 30 days supply | Qty: 90 | Fill #1

## 2017-06-09 MED FILL — GABAPENTIN 300 MG CAPSULE: 300 | 30 days supply | Qty: 90 | Fill #3

## 2017-08-19 ENCOUNTER — Encounter (INDEPENDENT_AMBULATORY_CARE_PROVIDER_SITE_OTHER): Payer: Self-pay | Admitting: Physician Assistant

## 2017-08-19 ENCOUNTER — Telehealth (INDEPENDENT_AMBULATORY_CARE_PROVIDER_SITE_OTHER): Payer: Self-pay | Admitting: Specialist

## 2017-08-19 ENCOUNTER — Ambulatory Visit (INDEPENDENT_AMBULATORY_CARE_PROVIDER_SITE_OTHER): Payer: BLUE CROSS/BLUE SHIELD | Admitting: Physician Assistant

## 2017-08-19 VITALS — BP 103/67 | HR 78 | Temp 97.4°F | Resp 18 | Ht 65.0 in | Wt 156.0 lb

## 2017-08-19 DIAGNOSIS — M47896 Other spondylosis, lumbar region: Secondary | ICD-10-CM

## 2017-08-19 DIAGNOSIS — G8929 Other chronic pain: Secondary | ICD-10-CM | POA: Diagnosis not present

## 2017-08-19 DIAGNOSIS — M5441 Lumbago with sciatica, right side: Secondary | ICD-10-CM | POA: Diagnosis not present

## 2017-08-19 DIAGNOSIS — M47816 Spondylosis without myelopathy or radiculopathy, lumbar region: Secondary | ICD-10-CM

## 2017-08-19 MED ORDER — ACETAMINOPHEN-CODEINE #3 300-30 MG PO TABS: 1.0000 | ORAL_TABLET | Freq: Two times a day (BID) | ORAL | 0 refills | Status: DC

## 2017-08-19 MED ORDER — GABAPENTIN 300 MG PO CAPS
600.0000 mg | ORAL_CAPSULE | Freq: Three times a day (TID) | ORAL | 5 refills | Status: DC
Start: 2017-08-19 — End: 2017-12-11

## 2017-08-19 MED FILL — GABAPENTIN 300 MG CAPSULE: 300 | 30 days supply | Qty: 180 | Fill #0

## 2017-08-19 NOTE — Progress Notes (Signed)
Subjective:  Patient ID: Kathleen Nguyen, female    DOB: [birth year]  Age: 32 y.o. MRN: [id number]  CC: f/u  HPI Ravinder Hofland is a 32 y.o. female with a medical history of LBP w/sciatica and PCOS presents with lower back pain with sciatica R > L. Has taken gabapentin 300 mg TID with some relief. MRI on 11/22/2016 revealed mild bilateral facet hypertrophy at L3-4 and L4-5. Would like to progress with different treatment modalities if possible. Does not endorse any other complaints or symptoms. Says her menometrorrhagia was relieved with metformin but she stopped taking due to diarrhea. Says menstrual periods have stayed regular despite stopping metformin.         Outpatient Medications Prior to Visit  Medication Sig Dispense Refill  . clotrimazole (LOTRIMIN) 1 % cream Apply 1 application topically 2 (two) times daily. 30 g 1  . gabapentin (NEURONTIN) 300 MG capsule Take 1 capsule (300 mg total) by mouth 3 (three) times daily. 90 capsule 3  . naproxen (NAPROSYN) 500 MG tablet Take 1 tablet (500 mg total) by mouth 2 (two) times daily with a meal. 14 tablet 1  . metFORMIN (GLUCOPHAGE) 500 MG tablet Take 1 tablet (500 mg total) by mouth 3 (three) times daily. (Patient not taking: Reported on 01/13/2017) 90 tablet 5  . diphenhydrAMINE (BENADRYL) 25 MG tablet Take 1 tablet (25 mg total) by mouth 2 (two) times daily. (Patient not taking: Reported on 01/13/2017) 30 tablet 0   No facility-administered medications prior to visit.      ROS Review of Systems  Constitutional: Negative for chills, fever and malaise/fatigue.  Eyes: Negative for blurred vision.  Respiratory: Negative for shortness of breath.   Cardiovascular: Negative for chest pain and palpitations.  Gastrointestinal: Negative for abdominal pain and nausea.  Genitourinary: Negative for dysuria and hematuria.  Musculoskeletal: Positive for back pain. Negative for joint pain and myalgias.  Skin: Negative for rash.  Neurological:  Negative for tingling and headaches.  Psychiatric/Behavioral: Negative for depression. The patient is not nervous/anxious.     Objective:  Ht 5\' 5"  (1.651 m)   Wt 156 lb (70.8 kg)   LMP 08/07/2017   BMI 25.96 kg/m   BP/Weight 08/19/2017 01/13/2017 7/62/8315  Systolic BP - 176 160  Diastolic BP - 67 73  Wt. (Lbs) 156 - 153.2  BMI 25.96 - 26.3      Physical Exam  Constitutional: She is oriented to person, place, and time.  Well developed, well nourished, NAD, polite  HENT:  Head: Normocephalic and atraumatic.  Eyes: No scleral icterus.  Pulmonary/Chest: Effort normal.  Abdominal: Soft. Bowel sounds are normal.  Musculoskeletal: She exhibits no edema.  Full aROM of the back with pain elicited on flexion, extension and left rotation.   Neurological: She is alert and oriented to person, place, and time. No cranial nerve deficit. Coordination normal.  SLR negative bilaterally. Normal gait.   Skin: Skin is warm and dry. No rash noted. No erythema. No pallor.  Psychiatric: She has a normal mood and affect. Her behavior is normal. Thought content normal.  Vitals reviewed.    Assessment & Plan:    1. Facet hypertrophy of lumbar region - [facility] and was told she would first have to be accepted as a patient by Dr. Deno Etienne before she can be scheduled. [facility] will call us with approval or disapproval of her case.   2. Chronic bilateral low back pain with right-sided sciatica - Increase gabapentin (NEURONTIN) 300 MG  capsule; Take 2 capsules (600 mg total) by mouth 3 (three) times daily.  Dispense: 180 capsule; Refill: 5   Meds ordered this encounter  Medications  . gabapentin (NEURONTIN) 300 MG capsule    Sig: Take 2 capsules (600 mg total) by mouth 3 (three) times daily.    Dispense:  180 capsule    Refill:  5    Order Specific Question:   Supervising Provider    Answer:   Tresa Garter [id number]  . acetaminophen-codeine (TYLENOL #3) 300-30 MG  tablet    Sig: Take 1 tablet by mouth every 12 (twelve) hours.    Dispense:  30 tablet    Refill:  0    Order Specific Question:   Supervising Provider    Answer:   Tresa Garter [id number]    Follow-up: Return in about 4 weeks (around 09/16/2017) for back pain.   Clent Demark PA

## 2017-08-19 NOTE — Patient Instructions (Signed)

## 2017-08-19 NOTE — Telephone Encounter (Signed)
Domenica Fail, PA, with the [facility] called wanting to get this patient schedule with Dr. Louanne Skye because she felt like she was disrespected by Dr. Lorin Mercy and is addiment about not seeing him again.  Domenica Fail would like for you to call his office with an appointment time when Dr. Louanne Skye approves to see this patient.  His number is [phone].  Thank you.

## 2017-08-27 ENCOUNTER — Ambulatory Visit (INDEPENDENT_AMBULATORY_CARE_PROVIDER_SITE_OTHER): Payer: Self-pay | Admitting: Orthopaedic Surgery

## 2017-08-27 NOTE — Telephone Encounter (Signed)
Scheduled for 09/24/2017 @ 830

## 2017-09-04 MED FILL — ACETAMINOPHEN/COD #3 TABLET: 300-30 | 15 days supply | Qty: 30 | Fill #0

## 2017-09-15 ENCOUNTER — Ambulatory Visit (INDEPENDENT_AMBULATORY_CARE_PROVIDER_SITE_OTHER): Payer: BLUE CROSS/BLUE SHIELD | Admitting: Physician Assistant

## 2017-09-24 ENCOUNTER — Ambulatory Visit (INDEPENDENT_AMBULATORY_CARE_PROVIDER_SITE_OTHER): Payer: BLUE CROSS/BLUE SHIELD | Admitting: Specialist

## 2017-09-24 ENCOUNTER — Ambulatory Visit (INDEPENDENT_AMBULATORY_CARE_PROVIDER_SITE_OTHER): Payer: BLUE CROSS/BLUE SHIELD

## 2017-09-24 ENCOUNTER — Encounter (INDEPENDENT_AMBULATORY_CARE_PROVIDER_SITE_OTHER): Payer: Self-pay | Admitting: Specialist

## 2017-09-24 VITALS — BP 104/65 | HR 87

## 2017-09-24 DIAGNOSIS — M4696 Unspecified inflammatory spondylopathy, lumbar region: Secondary | ICD-10-CM

## 2017-09-24 DIAGNOSIS — M47816 Spondylosis without myelopathy or radiculopathy, lumbar region: Secondary | ICD-10-CM

## 2017-09-24 DIAGNOSIS — M545 Low back pain, unspecified: Secondary | ICD-10-CM

## 2017-09-24 DIAGNOSIS — M461 Sacroiliitis, not elsewhere classified: Secondary | ICD-10-CM | POA: Diagnosis not present

## 2017-09-24 MED ORDER — DICLOFENAC SODIUM 75 MG PO TBEC: DELAYED_RELEASE_TABLET | ORAL | 0 refills | Status: DC

## 2017-09-24 MED ORDER — METHYLPREDNISOLONE 4 MG PO TBPK: ORAL_TABLET | ORAL | 0 refills | Status: DC

## 2017-09-24 NOTE — Patient Instructions (Addendum)
Avoid frequent bending and stooping  No lifting greater than 10 lbs. May use ice or moist heat for pain. Weight loss is of benefit. Start a medrol dose pak, 6 days then wait 2 days before starting diclofenac by mouth. Take medications with food or snack Decrease exercise by 50%. Tylenol ES 3-4 times per day. Lab work to assess for inflamatory spondylosis. Have husband help with childrens baths at night.

## 2017-09-24 NOTE — Progress Notes (Signed)
Office Visit Note   Patient: Kathleen Nguyen           Date of Birth: [birth year]           MRN: [id number] Visit Date: 09/24/2017              Requested by: Clent Demark, PA-C [place], [place] [address] PCP: Clent Demark, PA-C   Assessment & Plan: Visit Diagnoses:  1. Low back pain without sciatica, unspecified back pain laterality, unspecified chronicity   2. Sacroiliac inflammation (HCC)   3. Spondylosis without myelopathy or radiculopathy, lumbar region   4. Lumbar spondylitis (HCC)     Plan: Avoid frequent bending and stooping  No lifting greater than 10 lbs. May use ice or moist heat for pain. Weight loss is of benefit. Start a medrol dose pak, 6 days then wait 2 days before starting diclofenac by mouth. Take medications with food or snack Decrease exercise by 50%. Tylenol ES 3-4 times per day. Lab work to assess for inflamatory spondylosis. Have husband help with childrens baths at night.   Follow-Up Instructions: Return in about 1 month (around 10/22/2017).   Orders:  Orders Placed This Encounter  Procedures  . XR Lumbar Spine 2-3 Views  . Sed Rate (ESR)  . CRP High sensitivity  . Rheumatoid Factor  . Uric acid  . Antinuclear Antib (ANA)   Meds ordered this encounter  Medications  . methylPREDNISolone (MEDROL DOSEPAK) 4 MG TBPK tablet    Sig: Take as directed.    Dispense:  21 tablet    Refill:  0  . diclofenac (VOLTAREN) 75 MG EC tablet    Sig: Take 1 tablet (75 mg total) by mouth daily after breakfast for 14 days, THEN 1 tablet (75 mg total) 2 (two) times daily for 14 days.    Dispense:  42 tablet    Refill:  0      Procedures: No procedures performed   Clinical Data: Findings:  MRi of the lumbar spine and of the pelvis from 6-12/2016 shows anterior marginal "corner" disc changes at L3-4, L4-5 and L5-S1. Pelvis with changes in the SI joints with adjacent marrow edema and fat infiltration consistent with inflamatory  changes may represent early DDD but also can be seen with inflamatory spondylitis and Anklylosis Spondylitis and RA.    Subjective: Chief Complaint  Patient presents with  . Lower Back - Pain    32 year old female with 5 year history of low back pain. 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 She reports that with the first and second pregnancy the low back pain worsened. Reports that back pain llimits her ability to be amorous with her husband. After marriage she feels like the problem seemed to worsen. She has pain with bending and stooping and lifting the children with going to lift her son and with leaning into the tub. Saw Dr. Lorin Mercy about one year ago and he felt that there Was not surgical solution and she was not satisfied with his exam. There is a strong history of back pain and rheumatology.    Review of Systems  Constitutional: Negative.   HENT: Negative.   Eyes: Negative.   Respiratory: Negative.   Cardiovascular: Negative.   Gastrointestinal: Negative.   Endocrine: Negative for cold intolerance, heat intolerance, polydipsia, polyphagia and polyuria.  Genitourinary: Negative for difficulty urinating, dyspareunia, dysuria, enuresis, flank pain and hematuria.  Musculoskeletal:  Positive for back pain. Negative for arthralgias, gait problem, joint swelling, myalgias, neck pain and neck stiffness.  Skin: Negative for color change, pallor, rash and wound.  Neurological: Negative for dizziness, tremors, seizures, syncope, facial asymmetry, speech difficulty, weakness, light-headedness, numbness and headaches.  Hematological: Negative for adenopathy. Does not bruise/bleed easily.  Psychiatric/Behavioral: Negative for agitation, behavioral problems, confusion, decreased concentration, dysphoric mood, hallucinations, self-injury, sleep disturbance and suicidal ideas. The patient is not nervous/anxious and is not hyperactive.       Objective: Vital Signs: BP 104/65   Pulse 87   Physical Exam  Constitutional: She is oriented to person, place, and time. She appears well-developed and well-nourished.  HENT:  Head: Normocephalic and atraumatic.  Eyes: Pupils are equal, round, and reactive to light. EOM are normal.  Neck: Normal range of motion. Neck supple.  Pulmonary/Chest: Effort normal and breath sounds normal.  Abdominal: Soft. Bowel sounds are normal.  Neurological: She is alert and oriented to person, place, and time.  Skin: Skin is warm and dry.  Psychiatric: She has a normal mood and affect. Her behavior is normal. Judgment and thought content normal.    Back Exam   Tenderness  The patient is experiencing tenderness in the lumbar and sacroiliac.  Range of Motion  Extension:  10 abnormal  Flexion:  60 abnormal  Lateral bend right: normal  Lateral bend left: normal  Rotation right: normal  Rotation left: normal   Muscle Strength  Right Quadriceps:  5/5  Left Quadriceps:  5/5  Right Hamstrings:  5/5  Left Hamstrings:  5/5   Tests  Straight leg raise right: negative Straight leg raise left: negative  Reflexes  Patellar: normal Achilles: normal Biceps: normal Babinski's sign: normal   Other  Toe walk: normal Heel walk: normal Sensation: normal Gait: normal  Erythema: no back redness Scars: absent  Comments:  Pain with figure of four.       Specialty Comments:  No specialty comments available.  Imaging: Xr Lumbar Spine 2-3 Views  Result Date: 09/24/2017 AP and lateral flexion and extension radiographs show changes of anterior corner of L3-4, L4-5 and L5-S1 discs, no loss of disc height. SI joints with minimal sclerosis right inferior SI.    PMFS History: Patient Active Problem List   Diagnosis Date Noted  . Uterine adenomyoma 12/11/2016  . Tinea pedis of left foot 12/05/2016  . Pelvic pain 09/23/2016   History reviewed. No pertinent past medical history.   History reviewed. No pertinent family history.  History reviewed. No pertinent surgical history. Social History   Occupational History  . Not on file  Tobacco Use  . Smoking status: Never Smoker  . Smokeless tobacco: Never Used  Substance and Sexual Activity  . Alcohol use: No    Frequency: Never  . Drug use: No  . Sexual activity: Yes

## 2017-10-29 ENCOUNTER — Encounter (INDEPENDENT_AMBULATORY_CARE_PROVIDER_SITE_OTHER): Payer: Self-pay | Admitting: Specialist

## 2017-10-29 ENCOUNTER — Ambulatory Visit (INDEPENDENT_AMBULATORY_CARE_PROVIDER_SITE_OTHER): Payer: BLUE CROSS/BLUE SHIELD | Admitting: Specialist

## 2017-10-29 VITALS — BP 102/72 | HR 88 | Ht 63.75 in | Wt 158.0 lb

## 2017-10-29 DIAGNOSIS — H04122 Dry eye syndrome of left lacrimal gland: Secondary | ICD-10-CM

## 2017-10-29 DIAGNOSIS — M461 Sacroiliitis, not elsewhere classified: Secondary | ICD-10-CM

## 2017-10-29 MED ORDER — ARTIFICIAL TEARS OPHTHALMIC OINT: TOPICAL_OINTMENT | OPHTHALMIC | 2 refills | Status: DC | PRN

## 2017-10-29 NOTE — Patient Instructions (Signed)
Will request a rheumatology eval as steroid dose pak and diclofenac are of benefit. Return in 2 weeks to go over the results of blood work. Eye medicine prescribed can be used every 3-4 hours for eye pain.

## 2017-10-29 NOTE — Progress Notes (Addendum)
   Office Visit Note   Patient: Kathleen Nguyen           Date of Birth: August 13, 1985           MRN: [id number] Visit Date: 10/29/2017              Requested by: Clent Demark, PA-C [place], [place] [address] PCP: Clent Demark, PA-C   Assessment & Plan: Visit Diagnoses:  1. Sacroiliac inflammation ([place])   2. Dry eye syndrome, left     Plan: Will request a rheumatology eval as steroid dose pak and diclofenac are of benefit. Return in 2 weeks to go over the results of blood work. Eye medicine prescribed can be used every 3-4 hours for eye pain  Follow-Up Instructions: Return in about 2 weeks (around 11/12/2017).   Orders:  Orders Placed This Encounter  Procedures  . Rheumatoid Factor  . Antinuclear Antib (ANA)  . Sed Rate (ESR)  . C-reactive protein  . Cyclic citrul peptide antibody, IgG  . Ambulatory referral to Rheumatology   Meds ordered this encounter  Medications  . artificial tears (LACRILUBE) OINT ophthalmic ointment    Sig: Place into both eyes every 4 (four) hours as needed for dry eyes. 3.5 gm tube    Dispense:  1 Tube    Refill:  2      Procedures: No procedures performed   Clinical Data: No additional findings.   Subjective: Chief Complaint  Patient presents with  . Lower Back - Follow-up    32 year old female with history of low back and buttock pain she has mainly in the sacroiliac joint. She has been having pain in the back and buttock with AM stiffness and pain with first standing after prolong sitting.    Review of Systems   Objective: Vital Signs: BP 102/72 (BP Location: Left Arm, Patient Position: Sitting)   Pulse 88   Ht 5' 3.75" (1.619 m)   Wt 158 lb (71.7 kg)   BMI 27.33 kg/m   Physical Exam  Ortho Exam  Specialty Comments:  No specialty comments available.  Imaging: No results found.   PMFS History: Patient Active Problem List   Diagnosis Date Noted  . Uterine adenomyoma 12/11/2016  . Tinea  pedis of left foot 12/05/2016  . Pelvic pain 09/23/2016   No past medical history on file.  No family history on file.  No past surgical history on file. Social History   Occupational History  . Not on file  Tobacco Use  . Smoking status: Never Smoker  . Smokeless tobacco: Never Used  Substance and Sexual Activity  . Alcohol use: No    Frequency: Never  . Drug use: No  . Sexual activity: Yes

## 2017-10-31 LAB — CYCLIC CITRUL PEPTIDE ANTIBODY, IGG: Cyclic Citrullin Peptide Ab: <16 UNITS

## 2017-10-31 LAB — ANA: Anti Nuclear Antibody(ANA): NEGATIVE

## 2017-10-31 LAB — RHEUMATOID FACTOR: Rhuematoid fact SerPl-aCnc: <14 IU/mL (ref <14)

## 2017-10-31 LAB — C-REACTIVE PROTEIN: CRP: 2.9 mg/L (ref <8.0)

## 2017-10-31 LAB — SEDIMENTATION RATE: Sed Rate: 14 mm/h (ref 0–20)

## 2017-12-05 ENCOUNTER — Encounter (INDEPENDENT_AMBULATORY_CARE_PROVIDER_SITE_OTHER): Payer: Self-pay

## 2017-12-05 ENCOUNTER — Ambulatory Visit (INDEPENDENT_AMBULATORY_CARE_PROVIDER_SITE_OTHER): Payer: BLUE CROSS/BLUE SHIELD | Admitting: Specialist

## 2017-12-08 NOTE — Progress Notes (Signed)
Office Visit Note  Patient: Kathleen Nguyen             Date of Birth: 10/22/1985           MRN: [id number]             PCP: Clent Demark, PA-C Referring: Jessy Oto, MD Visit Date: 12/19/2017 Occupation: @GUAROCC @    Subjective:  Lower back pain  History of Present Illness: Kathleen Nguyen is a 32 y.o. female seen in consultation per request of Dr. Louanne Skye.  According to patient her symptoms started about 6 years ago while she was living in [place].  She says the pain is started in her lower back which she describes in the SI joint area she states initially the pain was off and on and then the pain became constant in the last 2 years.  She is given antibiotics per patient ensued in which did not help.  She had seen Dr. Louanne Skye recently and the x-ray showed some sclerosis.  She was given diclofenac which according to patient did not help.  She has been taking Tylenol.  She states none of other joints are painful.  There is no history of joint swelling.  She denies any history of eye inflammation there is no history of diarrhea constipation or blood in stool.  She denies any family history of psoriasis.  She believes her mother may have rheumatoid arthritis.  Patient denies any history of Achilles tendinitis or plantar fasciitis.  She denies any history of uveitis.  She has history of dry eyes.  Activities of Daily Living:  Patient reports morning stiffness for 12 hours.   Patient Reports nocturnal pain.  Difficulty dressing/grooming: Reports Difficulty climbing stairs: Reports Difficulty getting out of chair: Reports Difficulty using hands for taps, buttons, cutlery, and/or writing: Reports   Review of Systems  Constitutional: Positive for fatigue. Negative for fever.  HENT: Negative for ear pain.   Eyes: Positive for dryness. Negative for pain.  Respiratory: Negative for cough and shortness of breath.   Cardiovascular: Negative for swelling in legs/feet.  Gastrointestinal: Negative  for constipation and diarrhea.  Genitourinary: Negative for difficulty urinating.  Musculoskeletal: Positive for arthralgias, joint pain, myalgias, muscle weakness and myalgias. Negative for joint swelling.  Skin: Positive for hair loss.  Neurological: Negative for numbness and weakness.  Hematological: Negative for bruising/bleeding tendency.  Psychiatric/Behavioral: Positive for depressed mood and sleep disturbance.    PMFS History:  Patient Active Problem List   Diagnosis Date Noted  . Uterine adenomyoma 12/11/2016  . Tinea pedis of left foot 12/05/2016  . Pelvic pain 09/23/2016    Past Medical History:  Diagnosis Date  . Rheumatoid arthritis ([place])     History reviewed. No pertinent family history. Past Surgical History:  Procedure Laterality Date  . CESAREAN SECTION    . CESAREAN SECTION     Social History   Social History Narrative  . Not on file     Objective: Vital Signs: BP 95/66 (BP Location: Right Arm, Patient Position: Sitting, Cuff Size: Normal)   Pulse 79   Ht 5' 3.5" (1.613 m)   Wt 144 lb (65.3 kg)   LMP 12/01/2017 (Exact Date)   BMI 25.11 kg/m    Physical Exam  Constitutional: She is oriented to person, place, and time. She appears well-developed and well-nourished.  HENT:  Head: Normocephalic and atraumatic.  Eyes: Conjunctivae and EOM are normal.  Neck: Normal range of motion.  Cardiovascular: Normal rate, regular rhythm, normal  heart sounds and intact distal pulses.  Pulmonary/Chest: Effort normal and breath sounds normal.  Abdominal: Soft. Bowel sounds are normal.  Lymphadenopathy:    She has no cervical adenopathy.  Neurological: She is alert and oriented to person, place, and time.  Skin: Skin is warm and dry. Capillary refill takes less than 2 seconds.  Psychiatric: She has a normal mood and affect. Her behavior is normal.  Nursing note and vitals reviewed.    Musculoskeletal Exam: C-spine and thoracic spine good range of motion.  She  had good range of motion of the lumbar spine with some discomfort.  She has some tenderness over bilateral SI joints.  She also had tenderness over gluteal region.  Shoulder joints, elbow joints, wrist joints, MCPs PIPs and DIPs were in good range of motion with no synovitis.  Hip joints, knee joints, ankles, MTPs PIPs and DIPs were in good range of motion with no synovitis.  CDAI Exam: No CDAI exam completed.    Investigation: Findings:  10/29/17: RF <14, CCP <16, CRP 2.9, ANA -, sed rate 14   Component     Latest Ref Rng & Units 10/29/2017  RA Latex Turbid.     <14 IU/mL <14  Anti Nuclear Antibody(ANA)     NEGATIVE NEGATIVE  Sed Rate     0 - 20 mm/h 14  CRP     <2.1 mg/L 2.9  Cyclic Citrullin Peptide Ab     UNITS <16   CBC Latest Ref Rng & Units 09/12/2016  WBC 3.4 - 10.8 x10E3/uL 4.9  Hemoglobin 11.1 - 15.9 g/dL 12.0  Hematocrit 34.0 - 46.6 % 36.5   CMP Latest Ref Rng & Units 12/11/2017 12/10/2016 09/12/2016  Glucose 65 - 99 mg/dL 75 - 99  BUN 7 - 25 mg/dL 13 - 9  Creatinine 0.50 - 1.10 mg/dL 0.50 0.51 0.53(L)  Sodium 135 - 146 mmol/L 138 - 140  Potassium 3.5 - 5.3 mmol/L 4.5 - 4.0  Chloride 98 - 110 mmol/L 102 - 98  CO2 20 - 32 mmol/L 29 - 26  Calcium 8.6 - 10.2 mg/dL 9.4 - 9.6  Total Protein 6.1 - 8.1 g/dL 8.0 - 8.2  Total Bilirubin 0.2 - 1.2 mg/dL 0.4 - 0.2  Alkaline Phos 39 - 117 IU/L - - 37(L)  AST 10 - 30 U/L 14 - 17  ALT 6 - 29 U/L 13 - 26     Imaging: Xr Pelvis 1-2 Views  Result Date: 12/19/2017 Bilateral SI joint sclerosis with narrowing of the left SI joint was noted.    Speciality Comments: No specialty comments available.    Procedures:  No procedures performed Allergies: Patient has no known allergies.   Assessment / Plan:     Visit Diagnoses: Chronic SI joint pain - Lumbar XR-Dr. Louanne Skye: joints with minimal sclerosis right inferior SI. Patient had response to steroids and diclofenac po per Dr. Otho Ket records.  Hold the patient states that she had  no response to the medications.  She has been taking Tylenol and Cymbalta.  She also has discomfort in the lumbar region and gluteal region.  10/29/17: RF <14, CCP <16, CRP 2.9, sed rate 14 -I will obtain Ferguson pelvis today.  X-ray today showed bilateral SI joint to sclerosis and narrowing of the left SI joint.  Plan: XR Pelvis 1-2 Views, I will schedule MRI of the SI joints to evaluate this further.  HLA-B27 antigen along with following labs will be obtained.  Eye dryness-patient complains of dry  eyes but no discomfort in her eyes.  I advised patient to schedule an appointment with ophthalmologist.  Language barrier - Pt speaks arabic.  The translator was present in the room.  Tinea pedis of left foot  Uterine adenomyoma    Orders: Orders Placed This Encounter  Procedures  . XR Pelvis 1-2 Views  . DG Chest 2 View  . MR SACRUM SI JOINTS WO CONTRAST  . HLA-B27 antigen  . CBC with Differential/Platelet  . Hepatitis B core antibody, IgM  . Hepatitis B surface antigen  . Hepatitis C antibody  . IgG, IgA, IgM  . Serum protein electrophoresis with reflex  . QuantiFERON-TB Gold Plus   No orders of the defined types were placed in this encounter.   Face-to-face time spent with patient was 45 minutes. >50% of time was spent in counseling and coordination of care.  Follow-Up Instructions: Return for lower back pain.   Bo Merino, MD  Note - This record has been created using Editor, commissioning.  Chart creation errors have been sought, but may not always  have been located. Such creation errors do not reflect on  the standard of medical care.

## 2017-12-11 ENCOUNTER — Encounter (INDEPENDENT_AMBULATORY_CARE_PROVIDER_SITE_OTHER): Payer: Self-pay | Admitting: Specialist

## 2017-12-11 ENCOUNTER — Ambulatory Visit (INDEPENDENT_AMBULATORY_CARE_PROVIDER_SITE_OTHER): Payer: BLUE CROSS/BLUE SHIELD | Admitting: Specialist

## 2017-12-11 VITALS — BP 112/70 | HR 87 | Ht 63.75 in | Wt 158.0 lb

## 2017-12-11 DIAGNOSIS — M533 Sacrococcygeal disorders, not elsewhere classified: Secondary | ICD-10-CM

## 2017-12-11 DIAGNOSIS — F1990 Other psychoactive substance use, unspecified, uncomplicated: Secondary | ICD-10-CM | POA: Diagnosis not present

## 2017-12-11 DIAGNOSIS — M47816 Spondylosis without myelopathy or radiculopathy, lumbar region: Secondary | ICD-10-CM | POA: Diagnosis not present

## 2017-12-11 DIAGNOSIS — M5489 Other dorsalgia: Secondary | ICD-10-CM

## 2017-12-11 LAB — COMPREHENSIVE METABOLIC PANEL
AG Ratio: 1.3 (calc) (ref 1.0–2.5)
ALKALINE PHOSPHATASE (APISO): 34 U/L (ref 33–115)
ALT: 13 U/L (ref 6–29)
AST: 14 U/L (ref 10–30)
Albumin: 4.5 g/dL (ref 3.6–5.1)
BUN: 13 mg/dL (ref 7–25)
CALCIUM: 9.4 mg/dL (ref 8.6–10.2)
CHLORIDE: 102 mmol/L (ref 98–110)
CO2: 29 mmol/L (ref 20–32)
CREATININE: 0.5 mg/dL (ref 0.50–1.10)
GLOBULIN: 3.5 g/dL (ref 1.9–3.7)
Glucose, Bld: 75 mg/dL (ref 65–99)
Potassium: 4.5 mmol/L (ref 3.5–5.3)
Sodium: 138 mmol/L (ref 135–146)
Total Bilirubin: 0.4 mg/dL (ref 0.2–1.2)
Total Protein: 8 g/dL (ref 6.1–8.1)

## 2017-12-11 LAB — EXTRA LAV TOP TUBE

## 2017-12-11 MED ORDER — DICLOFENAC SODIUM 75 MG PO TBEC: 75.0000 mg | DELAYED_RELEASE_TABLET | Freq: Two times a day (BID) | ORAL | 3 refills | Status: DC

## 2017-12-11 MED ORDER — DULOXETINE HCL 20 MG PO CPEP: 20.0000 mg | ORAL_CAPSULE | Freq: Every day | ORAL | 3 refills | Status: DC

## 2017-12-11 NOTE — Patient Instructions (Signed)
Keep appointment with Dr. Estanislado Pandy [facility] to assess seronegative inflamatory spondylitis and SI dysfunction. Start cymbalta 20 mg po daily Take diclofenac 75 mg po twice a day  Do Not take these medication more than prescribed.  Pool walking program.Avoid frequent bending and stooping  No lifting greater than 10 lbs. May use ice or moist heat for pain. Weight loss is of benefit.  CMET drawn today as patient describes taking the diclofenac 2 tablets 3 times per day. Explained risks associated with over use. Recommend she Take antiinflamatory medications only as prescribed.  Trial of cymbalta.

## 2017-12-11 NOTE — Progress Notes (Signed)
Office Visit Note   Patient: Kathleen Nguyen           Date of Birth: 30-Jul-1985           MRN: [id number] Visit Date: 12/11/2017              Requested by: Clent Demark, PA-C [place],  [id number] PCP: Clent Demark, PA-C   Assessment & Plan: Visit Diagnoses:  1. Spondylosis without myelopathy or radiculopathy, lumbar region   2. Sacroiliac joint disease   3. Inflammatory back pain     Plan: Keep appointment with Dr. Estanislado Pandy [facility] to assess seronegative inflamatory spondylitis and SI dysfunction. Start cymbalta 20 mg po daily Take diclofenac 75 mg po twice a day  Do Not take these medication more than prescribed.  Pool walking program.Avoid frequent bending and stooping  No lifting greater than 10 lbs. May use ice or moist heat for pain. Weight loss is of benefit.  CMET drawn today as patient describes taking the diclofenac 2 tablets 3 times per day. Explained risks associated with over use. Recommend she Take antiinflamatory medications only as prescribed.  Trial of cymbalta.    Follow-Up Instructions: Return in about 3 months (around 03/13/2018).   Orders:  No orders of the defined types were placed in this encounter.  No orders of the defined types were placed in this encounter.     Procedures: No procedures performed   Clinical Data: No additional findings.   Subjective: Chief Complaint  Patient presents with  . Lower Back - Follow-up    32 year old female with history of primary back and buttock pain. MRI of the lumbar spine with corner lesions and pain is sacroiliac and mechanical. She complains mainly of pain and difficulty with eye irritation. No bowel or bladder difficulty. Pain improves with use of prednisone and diclofenac. Her last prescriptions were not filled and she relates that there was no eye drops prescribed.  Her pain is right buttock and right posterior thigh and posterior calf. No numbness or  paresthesias.  Interpreter Bedor was available today as patient does not speak english. She relates that she is taking diclofenac and it helps but she is taking  2 diclofenac tablets 3 times per day.   Review of Systems  Constitutional: Positive for activity change. Negative for appetite change, chills, diaphoresis, fatigue, fever and unexpected weight change.  HENT: Negative for congestion, dental problem, drooling, ear discharge, ear pain, facial swelling, hearing loss, mouth sores, nosebleeds, postnasal drip, rhinorrhea, sinus pressure, sinus pain, sneezing, sore throat, tinnitus, trouble swallowing and voice change.   Eyes: Negative.  Negative for photophobia, pain, discharge, redness, itching and visual disturbance.  Respiratory: Negative.  Negative for apnea, cough, choking, chest tightness, shortness of breath, wheezing and stridor.   Cardiovascular: Negative.  Negative for chest pain, palpitations and leg swelling.  Gastrointestinal: Negative for abdominal distention, abdominal pain, anal bleeding, blood in stool, constipation, diarrhea, nausea, rectal pain and vomiting.  Endocrine: Negative.  Negative for cold intolerance and heat intolerance.  Genitourinary: Negative.  Negative for difficulty urinating, dyspareunia and dysuria.  Musculoskeletal: Positive for back pain. Negative for arthralgias, gait problem, joint swelling, myalgias, neck pain and neck stiffness.  Skin: Positive for rash. Negative for color change, pallor and wound.  Allergic/Immunologic: Negative.  Negative for environmental allergies, food allergies and immunocompromised state.  Neurological: Positive for weakness and numbness. Negative for dizziness, tremors, seizures, syncope, facial asymmetry, speech difficulty, light-headedness and headaches.  Hematological: Negative.  Negative for adenopathy. Does not bruise/bleed easily.  Psychiatric/Behavioral: Negative.  Negative for agitation, behavioral problems, confusion,  decreased concentration, dysphoric mood, hallucinations, self-injury, sleep disturbance and suicidal ideas. The patient is not nervous/anxious and is not hyperactive.      Objective: Vital Signs: BP 112/70 (BP Location: Left Arm, Patient Position: Sitting)   Pulse 87   Ht 5' 3.75" (1.619 m)   Wt 158 lb (71.7 kg)   BMI 27.33 kg/m   Physical Exam  Back Exam   Tenderness  The patient is experiencing tenderness in the lumbar.  Range of Motion  Extension: normal  Flexion: normal  Lateral bend right: normal  Lateral bend left: normal  Rotation right: normal  Rotation left: normal   Muscle Strength  The patient has normal back strength. Right Quadriceps:  4/5  Right Hamstrings:  4/5   Tests  Straight leg raise right: negative Straight leg raise left: negative  Reflexes  Patellar: normal Achilles: normal Babinski's sign: normal   Other  Toe walk: normal Heel walk: normal Sensation: normal Gait: normal  Erythema: no back redness Scars: absent  Comments:  Patchy area left dorsal foot dry papular round rash 3-4 cm dorsal lateral forefoot overlying  The left 3-4 Metatarsals. Right hip with external rotation pain and pain with figure of 4.       Specialty Comments:  No specialty comments available.  Imaging: No results found.   PMFS History: Patient Active Problem List   Diagnosis Date Noted  . Uterine adenomyoma 12/11/2016  . Tinea pedis of left foot 12/05/2016  . Pelvic pain 09/23/2016   History reviewed. No pertinent past medical history.  History reviewed. No pertinent family history.  History reviewed. No pertinent surgical history. Social History   Occupational History  . Not on file  Tobacco Use  . Smoking status: Never Smoker  . Smokeless tobacco: Never Used  Substance and Sexual Activity  . Alcohol use: No    Frequency: Never  . Drug use: No  . Sexual activity: Yes

## 2017-12-18 ENCOUNTER — Ambulatory Visit (INDEPENDENT_AMBULATORY_CARE_PROVIDER_SITE_OTHER): Payer: BLUE CROSS/BLUE SHIELD | Admitting: Physician Assistant

## 2017-12-18 ENCOUNTER — Other Ambulatory Visit: Payer: Self-pay

## 2017-12-18 ENCOUNTER — Encounter (INDEPENDENT_AMBULATORY_CARE_PROVIDER_SITE_OTHER): Payer: Self-pay | Admitting: Physician Assistant

## 2017-12-18 VITALS — BP 102/70 | HR 81 | Temp 98.1°F | Ht 64.0 in | Wt 151.6 lb

## 2017-12-18 DIAGNOSIS — Z23 Encounter for immunization: Secondary | ICD-10-CM

## 2017-12-18 DIAGNOSIS — R102 Pelvic and perineal pain: Secondary | ICD-10-CM

## 2017-12-18 DIAGNOSIS — Z114 Encounter for screening for human immunodeficiency virus [HIV]: Secondary | ICD-10-CM | POA: Diagnosis not present

## 2017-12-18 DIAGNOSIS — D269 Other benign neoplasm of uterus, unspecified: Secondary | ICD-10-CM

## 2017-12-18 DIAGNOSIS — R350 Frequency of micturition: Secondary | ICD-10-CM | POA: Diagnosis not present

## 2017-12-18 LAB — POCT URINALYSIS DIPSTICK
Bilirubin, UA: NEGATIVE
Glucose, UA: NEGATIVE
Ketones, UA: NEGATIVE
LEUKOCYTES UA: NEGATIVE
NITRITE UA: NEGATIVE
Protein, UA: POSITIVE — AB
RBC UA: NEGATIVE
SPEC GRAV UA: 1.015 (ref 1.010–1.025)
UROBILINOGEN UA: 0.2 U/dL
pH, UA: 8 (ref 5.0–8.0)

## 2017-12-18 LAB — POCT URINE PREGNANCY: PREG TEST UR: NEGATIVE

## 2017-12-18 MED ORDER — ACETAMINOPHEN 500 MG PO TABS: 1000.0000 mg | ORAL_TABLET | Freq: Three times a day (TID) | ORAL | 0 refills | Status: AC | PRN

## 2017-12-18 NOTE — Progress Notes (Signed)
Subjective:  Patient ID: Kathleen Nguyen, female    DOB: 16-Sep-1985  Age: 32 y.o. MRN: [id number]  CC: referral to GYN  HPI  Kathleen Nguyen is a 32 y.o. female with a medical history of LBP w/sciatica and PCOS presents with complaint of chronic back pain and now with pelvic discomfort. Has associated urinary frequency. Has nausea also but thinks this may be due to diclofenac. Does not believe she may be pregnant due to her history of PCOS. MR pelvis from 12/10/17 revealed Ovarian morphology for which polycystic ovarian syndrome cannot be excluded. Junctional zone thickening is most consistent with uterine adenomyosis. Right uterine fundal lesion is favored to represent an adenomyoma. Pt has also been seen by orthopedic specialist and was diagnosed with sacroiliac joint disease. Orthopedic specialist ordered ESR, CRP, ANA, RF, Anti-CCP, and CMP all of which were negative. Orthopedic specialist has referred to rheumatology and patient will have appointment tomorrow.     Outpatient Medications Prior to Visit  Medication Sig Dispense Refill  . diclofenac (VOLTAREN) 75 MG EC tablet Take 1 tablet (75 mg total) by mouth 2 (two) times daily for 60 doses. 60 tablet 3  . DULoxetine (CYMBALTA) 20 MG capsule Take 1 capsule (20 mg total) by mouth daily. 30 capsule 3   No facility-administered medications prior to visit.      ROS Review of Systems  Constitutional: Negative for chills, fever and malaise/fatigue.  Eyes: Negative for blurred vision.  Respiratory: Negative for shortness of breath.   Cardiovascular: Negative for chest pain and palpitations.  Gastrointestinal: Negative for abdominal pain and nausea.  Genitourinary: Negative for dysuria and hematuria.       Pelvic pain  Musculoskeletal: Positive for back pain. Negative for joint pain and myalgias.  Skin: Negative for rash.  Neurological: Negative for tingling and headaches.  Psychiatric/Behavioral: Negative for depression. The patient is  not nervous/anxious.     Objective:  BP 102/70 (BP Location: Left Arm, Patient Position: Sitting, Cuff Size: Normal)   Pulse 81   Temp 98.1 F (36.7 C) (Oral)   Ht 5' 4"  (1.626 m)   Wt 151 lb 9.6 oz (68.8 kg)   LMP 12/01/2017 (Exact Date)   SpO2 98%   BMI 26.02 kg/m   BP/Weight 12/18/2017 0/12/1217 12/27/8830  Systolic BP 549 826 415  Diastolic BP 70 70 72  Wt. (Lbs) 151.6 158 158  BMI 26.02 27.33 27.33      Physical Exam  Constitutional: She is oriented to person, place, and time.  Well developed, well nourished, NAD, polite  HENT:  Head: Normocephalic and atraumatic.  Eyes: No scleral icterus.  Neck: Normal range of motion. Neck supple. No thyromegaly present.  Cardiovascular: Normal rate, regular rhythm and normal heart sounds.  Pulmonary/Chest: Effort normal and breath sounds normal.  Musculoskeletal: She exhibits no edema.  Neurological: She is alert and oriented to person, place, and time.  Skin: Skin is warm and dry. No rash noted. No erythema. No pallor.  Psychiatric: She has a normal mood and affect. Her behavior is normal. Thought content normal.  Vitals reviewed.    Assessment & Plan:    1. Pelvic pain in female - POCT urine pregnancy negative - acetaminophen (TYLENOL) 500 MG tablet; Take 2 tablets (1,000 mg total) by mouth every 8 (eight) hours as needed for up to 7 days.  Dispense: 21 tablet; Refill: 0 - Ambulatory referral to Gynecology.   2. Urinary frequency - Urinalysis Dipstick negative  3. Uterine adenomyoma - Ambulatory referral  to Gynecology  4. Need for Tdap vaccination - Tdap vaccine greater than or equal to 7yo IM  5. Screening for HIV (human immunodeficiency virus) - HIV antibody   Meds ordered this encounter  Medications  . acetaminophen (TYLENOL) 500 MG tablet    Sig: Take 2 tablets (1,000 mg total) by mouth every 8 (eight) hours as needed for up to 7 days.    Dispense:  21 tablet    Refill:  0    Order Specific Question:    Supervising Provider    Answer:   Charlott Rakes [[id number]]    Follow-up: Return if symptoms worsen or fail to improve.   Clent Demark PA

## 2017-12-18 NOTE — Patient Instructions (Addendum)
Pelvic Pain, Female Pelvic pain is pain in your lower belly (abdomen), below your belly button and between your hips. The pain may start suddenly (acute), keep coming back (recurring), or last a long time (chronic). Pelvic pain that lasts longer than six months is considered chronic. There are many causes of pelvic pain. Sometimes the cause of your pelvic pain is not known. Follow these instructions at home:  Take over-the-counter and prescription medicines only as told by your doctor.  Rest as told by your doctor.  Do not have sex it if hurts.  Keep a journal of your pelvic pain. Write down: ? When the pain started. ? Where the pain is located. ? What seems to make the pain better or worse, such as food or your menstrual cycle. ? Any symptoms you have along with the pain.  Keep all follow-up visits as told by your doctor. This is important. Contact a doctor if:  Medicine does not help your pain.  Your pain comes back.  You have new symptoms.  You have unusual vaginal discharge or bleeding.  You have a fever or chills.  You are having a hard time pooping (constipation).  You have blood in your pee (urine) or poop (stool).  Your pee smells bad.  You feel weak or lightheaded. Get help right away if:  You have sudden pain that is very bad.  Your pain continues to get worse.  You have very bad pain and also have any of the following symptoms: ? A fever. ? Feeling stick to your stomach (nausea). ? Throwing up (vomiting). ? Being very sweaty.  You pass out (lose consciousness). This information is not intended to replace advice given to you by your health care provider. Make sure you discuss any questions you have with your health care provider. Document Released: 11/27/2007 Document Revised: 07/05/2015 Document Reviewed: 03/31/2015 Elsevier Interactive Patient Education  2018 [place] (Tetanus and Diphtheria): What You Need to Know 1. Why get  vaccinated? Tetanus  and diphtheria are very serious diseases. They are rare in the [place] today, but people who do become infected often have severe complications. Td vaccine is used to protect adolescents and adults from both of these diseases. Both tetanus and diphtheria are infections caused by bacteria. Diphtheria spreads from person to person through coughing or sneezing. Tetanus-causing bacteria enter the body through cuts, scratches, or wounds. TETANUS (lockjaw) causes painful muscle tightening and stiffness, usually all over the body.  It can lead to tightening of muscles in the head and neck so you can't open your mouth, swallow, or sometimes even breathe. Tetanus kills about 1 out of every 10 people who are infected even after receiving the best medical care.  DIPHTHERIA can cause a thick coating to form in the back of the throat.  It can lead to breathing problems, paralysis, heart failure, and death.  Before vaccines, as many as 200,000 cases of diphtheria and hundreds of cases of tetanus were reported in the [place] each year. Since vaccination began, reports of cases for both diseases have dropped by about 99%. 2. Td vaccine Td vaccine can protect adolescents and adults from tetanus and diphtheria. Td is usually given as a booster dose every 10 years but it can also be given earlier after a severe and dirty wound or burn. Another vaccine, called Tdap, which protects against pertussis in addition to tetanus and diphtheria, is sometimes recommended instead of Td vaccine. Your doctor or the person  giving you the vaccine can give you more information. Td may safely be given at the same time as other vaccines. 3. Some people should not get this vaccine  A person who has ever had a life-threatening allergic reaction after a previous dose of any tetanus or diphtheria containing vaccine, OR has a severe allergy to any part of this vaccine, should not get Td vaccine. Tell the  person giving the vaccine about any severe allergies.  Talk to your doctor if you: ? had severe pain or swelling after any vaccine containing diphtheria or tetanus, ? ever had a condition called Guillain Barre Syndrome (GBS), ? aren't feeling well on the day the shot is scheduled. 4. What are the risks from Td vaccine? With any medicine, including vaccines, there is a chance of side effects. These are usually mild and go away on their own. Serious reactions are also possible but are rare. Most people who get Td vaccine do not have any problems with it. Mild problems following Td vaccine: (Did not interfere with activities)  Pain where the shot was given (about 8 people in 10)  Redness or swelling where the shot was given (about 1 person in 4)  Mild fever (rare)  Headache (about 1 person in 4)  Tiredness (about 1 person in 4)  Moderate problems following Td vaccine: (Interfered with activities, but did not require medical attention)  Fever over 102F (rare)  Severe problems following Td vaccine: (Unable to perform usual activities; required medical attention)  Swelling, severe pain, bleeding and/or redness in the arm where the shot was given (rare).  Problems that could happen after any vaccine:  People sometimes faint after a medical procedure, including vaccination. Sitting or lying down for about 15 minutes can help prevent fainting, and injuries caused by a fall. Tell your doctor if you feel dizzy, or have vision changes or ringing in the ears.  Some people get severe pain in the shoulder and have difficulty moving the arm where a shot was given. This happens very rarely.  Any medication can cause a severe allergic reaction. Such reactions from a vaccine are very rare, estimated at fewer than 1 in a million doses, and would happen within a few minutes to a few hours after the vaccination. As with any medicine, there is a very remote chance of a vaccine causing a serious  injury or death. The safety of vaccines is always being monitored. For more information, visit: http://www.aguilar.org/ 5. What if there is a serious reaction? What should I look for? Look for anything that concerns you, such as signs of a severe allergic reaction, very high fever, or unusual behavior. Signs of a severe allergic reaction can include hives, swelling of the face and throat, difficulty breathing, a fast heartbeat, dizziness, and weakness. These would usually start a few minutes to a few hours after the vaccination. What should I do?  If you think it is a severe allergic reaction or other emergency that can't wait, call 9-1-1 or get the person to the nearest hospital. Otherwise, call your doctor.  Afterward, the reaction should be reported to the Vaccine Adverse Event Reporting System (VAERS). Your doctor might file this report, or you can do it yourself through the VAERS web site at www.vaers.SamedayNews.es, or by calling 949-163-8924. ? VAERS does not give medical advice. 6. The National Vaccine Injury Compensation Program The National Vaccine Injury Compensation Program (VICP) is a federal program that was created to compensate people who may have been  injured by certain vaccines. Persons who believe they may have been injured by a vaccine can learn about the program and about filing a claim by calling (272)102-6412 or visiting the [place] website at GoldCloset.com.ee. There is a time limit to file a claim for compensation. 7. How can I learn more?  Ask your doctor. He or she can give you the vaccine package insert or suggest other sources of information.  Call your local or state health department.  Contact the Centers for Disease Control and Prevention (CDC): ? Call 985-147-8764 (1-800-CDC-INFO) ? Visit CDC's website at http://hunter.com/ CDC Td Vaccine VIS (10/03/15) This information is not intended to replace advice given to you by your health care provider.  Make sure you discuss any questions you have with your health care provider. Document Released: 04/07/2006 Document Revised: 02/29/2016 Document Reviewed: 02/29/2016 Elsevier Interactive Patient Education  2017 [employer].

## 2017-12-19 ENCOUNTER — Telehealth: Payer: Self-pay

## 2017-12-19 ENCOUNTER — Encounter: Payer: Self-pay | Admitting: Rheumatology

## 2017-12-19 ENCOUNTER — Ambulatory Visit (HOSPITAL_COMMUNITY)
Admission: RE | Admit: 2017-12-19 | Discharge: 2017-12-19 | Disposition: A | Discharge status: Home / Self Care | Payer: BLUE CROSS/BLUE SHIELD | Source: Ambulatory Visit | Attending: Rheumatology | Admitting: Rheumatology

## 2017-12-19 ENCOUNTER — Ambulatory Visit (INDEPENDENT_AMBULATORY_CARE_PROVIDER_SITE_OTHER): Payer: BLUE CROSS/BLUE SHIELD | Admitting: Rheumatology

## 2017-12-19 ENCOUNTER — Ambulatory Visit (INDEPENDENT_AMBULATORY_CARE_PROVIDER_SITE_OTHER): Payer: Self-pay

## 2017-12-19 VITALS — BP 95/66 | HR 79 | Ht 63.5 in | Wt 144.0 lb

## 2017-12-19 DIAGNOSIS — H04129 Dry eye syndrome of unspecified lacrimal gland: Secondary | ICD-10-CM

## 2017-12-19 DIAGNOSIS — D269 Other benign neoplasm of uterus, unspecified: Secondary | ICD-10-CM | POA: Diagnosis not present

## 2017-12-19 DIAGNOSIS — G8929 Other chronic pain: Secondary | ICD-10-CM | POA: Diagnosis not present

## 2017-12-19 DIAGNOSIS — M533 Sacrococcygeal disorders, not elsewhere classified: Secondary | ICD-10-CM | POA: Diagnosis not present

## 2017-12-19 DIAGNOSIS — Z79899 Other long term (current) drug therapy: Secondary | ICD-10-CM | POA: Diagnosis present

## 2017-12-19 DIAGNOSIS — B353 Tinea pedis: Secondary | ICD-10-CM | POA: Diagnosis not present

## 2017-12-19 DIAGNOSIS — Z789 Other specified health status: Secondary | ICD-10-CM | POA: Diagnosis not present

## 2017-12-19 DIAGNOSIS — M069 Rheumatoid arthritis, unspecified: Secondary | ICD-10-CM | POA: Diagnosis not present

## 2017-12-19 LAB — HIV ANTIBODY (ROUTINE TESTING W REFLEX): HIV Screen 4th Generation wRfx: NONREACTIVE

## 2017-12-19 NOTE — Telephone Encounter (Signed)
-----   Message from Clent Demark, PA-C sent at 12/19/2017  8:48 AM EDT ----- HIV negative.

## 2017-12-19 NOTE — Telephone Encounter (Signed)
Patient aware of negative HIV. Patient expressed understanding. Nat Christen, CMA

## 2017-12-19 NOTE — Patient Instructions (Signed)
Please go to [facility] for chest x-ray.  Please schedule an appointment with ophthalmologist for evaluation of dry eyes and eye irritation.

## 2017-12-22 LAB — IGG, IGA, IGM
IGG (IMMUNOGLOBIN G), SERUM: 1805 mg/dL — AB (ref 694–1618)
IGM, SERUM: 149 mg/dL (ref 48–271)
Immunoglobulin A: 230 mg/dL (ref 81–463)

## 2017-12-22 LAB — CBC WITH DIFFERENTIAL/PLATELET
BASOS ABS: 18 {cells}/uL (ref 0–200)
Basophils Relative: 0.4 %
Eosinophils Absolute: 32 cells/uL (ref 15–500)
Eosinophils Relative: 0.7 %
HEMATOCRIT: 38.5 % (ref 35.0–45.0)
Hemoglobin: 12.3 g/dL (ref 11.7–15.5)
LYMPHS ABS: 1494 {cells}/uL (ref 850–3900)
MCH: 27.6 pg (ref 27.0–33.0)
MCHC: 31.9 g/dL — AB (ref 32.0–36.0)
MCV: 86.3 fL (ref 80.0–100.0)
MPV: 9.9 fL (ref 7.5–12.5)
Monocytes Relative: 8.6 %
NEUTROS PCT: 57.1 %
Neutro Abs: 2570 cells/uL (ref 1500–7800)
PLATELETS: 354 10*3/uL (ref 140–400)
RBC: 4.46 10*6/uL (ref 3.80–5.10)
RDW: 12.6 % (ref 11.0–15.0)
TOTAL LYMPHOCYTE: 33.2 %
WBC: 4.5 10*3/uL (ref 3.8–10.8)
WBCMIX: 387 {cells}/uL (ref 200–950)

## 2017-12-22 LAB — PROTEIN ELECTROPHORESIS, SERUM, WITH REFLEX
Albumin ELP: 4.4 g/dL (ref 3.8–4.8)
Alpha 1: 0.3 g/dL (ref 0.2–0.3)
Alpha 2: 0.9 g/dL (ref 0.5–0.9)
BETA 2: 0.4 g/dL (ref 0.2–0.5)
Beta Globulin: 0.4 g/dL (ref 0.4–0.6)
Gamma Globulin: 1.7 g/dL (ref 0.8–1.7)
Total Protein: 8 g/dL (ref 6.1–8.1)

## 2017-12-22 LAB — HEPATITIS C ANTIBODY
Hepatitis C Ab: NONREACTIVE
SIGNAL TO CUT-OFF: 0.05 (ref <1.00)

## 2017-12-22 LAB — QUANTIFERON-TB GOLD PLUS
NIL: 0.05 [IU]/mL
QUANTIFERON-TB GOLD PLUS: NEGATIVE
TB2-NIL: <0 IU/mL

## 2017-12-22 LAB — HLA-B27 ANTIGEN: HLA-B27 ANTIGEN: NEGATIVE

## 2017-12-22 LAB — HEPATITIS B CORE ANTIBODY, IGM: Hep B C IgM: NONREACTIVE

## 2017-12-22 LAB — HEPATITIS B SURFACE ANTIGEN: Hepatitis B Surface Ag: NONREACTIVE

## 2017-12-26 ENCOUNTER — Ambulatory Visit (HOSPITAL_COMMUNITY)
Admission: RE | Admit: 2017-12-26 | Discharge: 2017-12-26 | Disposition: A | Discharge status: Home / Self Care | Payer: BLUE CROSS/BLUE SHIELD | Source: Ambulatory Visit | Attending: Rheumatology | Admitting: Rheumatology

## 2017-12-26 DIAGNOSIS — M461 Sacroiliitis, not elsewhere classified: Secondary | ICD-10-CM | POA: Insufficient documentation

## 2017-12-26 DIAGNOSIS — M533 Sacrococcygeal disorders, not elsewhere classified: Secondary | ICD-10-CM | POA: Insufficient documentation

## 2017-12-26 DIAGNOSIS — G8929 Other chronic pain: Secondary | ICD-10-CM | POA: Diagnosis present

## 2017-12-26 DIAGNOSIS — R937 Abnormal findings on diagnostic imaging of other parts of musculoskeletal system: Secondary | ICD-10-CM | POA: Insufficient documentation

## 2017-12-29 NOTE — Progress Notes (Signed)
MRI c/w sacroiliitis.  We will discuss at follow-up visit unless there is an earlier appointment available.

## 2018-01-20 NOTE — Progress Notes (Signed)
Office Visit Note  Patient: Kathleen Nguyen             Date of Birth: Aug 10, 1985           MRN: [id number]             PCP: Clent Demark, PA-C Referring: Clent Demark, PA-C Visit Date: 02/02/2018 Occupation: '@GUAROCC'$ @  Subjective:  Lower back pain.   History of Present Illness: Kathleen Nguyen is a 32 y.o. female with ankylosing spondylitis.   Activities of Daily Living:  Patient reports morning stiffness for 24 hours.   Patient Reports nocturnal pain.  Difficulty dressing/grooming: Reports Difficulty climbing stairs: Reports Difficulty getting out of chair: Reports Difficulty using hands for taps, buttons, cutlery, and/or writing: Reports  Review of Systems  Constitutional: Positive for fatigue. Negative for activity change, night sweats, weight gain and weight loss.  HENT: Positive for mouth dryness. Negative for mouth sores, trouble swallowing, trouble swallowing and nose dryness.   Eyes: Positive for dryness. Negative for pain, redness and visual disturbance.  Respiratory: Negative for cough, shortness of breath and difficulty breathing.   Cardiovascular: Negative for chest pain, palpitations, hypertension, irregular heartbeat and swelling in legs/feet.  Gastrointestinal: Positive for constipation. Negative for blood in stool and diarrhea.  Endocrine: Positive for increased urination.  Genitourinary: Negative for difficulty urinating and vaginal dryness.  Musculoskeletal: Positive for arthralgias, gait problem, joint pain, muscle weakness and morning stiffness. Negative for joint swelling, myalgias, muscle tenderness and myalgias.  Skin: Negative for color change, rash, hair loss, skin tightness, ulcers and sensitivity to sunlight.  Allergic/Immunologic: Negative for susceptible to infections.  Neurological: Positive for weakness. Negative for dizziness, memory loss and night sweats.  Hematological: Negative for bruising/bleeding tendency and swollen glands.    Psychiatric/Behavioral: Positive for sleep disturbance. Negative for depressed mood. The patient is not nervous/anxious.     PMFS History:  Patient Active Problem List   Diagnosis Date Noted  . Uterine adenomyoma 12/11/2016  . Tinea pedis of left foot 12/05/2016  . Pelvic pain 09/23/2016    Past Medical History:  Diagnosis Date  . Rheumatoid arthritis ([place])     History reviewed. No pertinent family history. Past Surgical History:  Procedure Laterality Date  . CESAREAN SECTION    . CESAREAN SECTION     Social History   Social History Narrative  . Not on file    Objective: Vital Signs: BP 93/63 (BP Location: Left Arm, Patient Position: Sitting, Cuff Size: Normal)   Pulse 92   Resp 14   Ht 5' 0.6" (1.539 m)   Wt 158 lb 9.6 oz (71.9 kg)   LMP 12/30/2017   BMI 30.36 kg/m    Physical Exam  Constitutional: She is oriented to person, place, and time. She appears well-developed and well-nourished.  HENT:  Head: Normocephalic and atraumatic.  Eyes: Conjunctivae and EOM are normal.  Neck: Normal range of motion.  Cardiovascular: Normal rate, regular rhythm, normal heart sounds and intact distal pulses.  Pulmonary/Chest: Effort normal and breath sounds normal.  Abdominal: Soft. Bowel sounds are normal.  Lymphadenopathy:    She has no cervical adenopathy.  Neurological: She is alert and oriented to person, place, and time.  Skin: Skin is warm and dry. Capillary refill takes less than 2 seconds.  Psychiatric: She has a normal mood and affect. Her behavior is normal.  Nursing note and vitals reviewed.    Musculoskeletal Exam: Spine thoracic lumbar spine good range of motion.  She has tenderness over  bilateral SI joints.  Shoulder joints elbow joints wrist joint MCPs PIPs DIPs were in good range of motion with no synovitis.  Hip joints knee joints ankles MTPs PIPs DIPs were in good range of motion with no synovitis.  CDAI Exam: No CDAI exam completed.    Investigation: Findings:  10/29/17: RF <14, CCP <16, CRP 2.9, ANA -, sed rate 14   Imaging: No results found.  Recent Labs: Lab Results  Component Value Date   WBC 4.5 12/19/2017   HGB 12.3 12/19/2017   PLT 354 12/19/2017   NA 138 12/11/2017   K 4.5 12/11/2017   CL 102 12/11/2017   CO2 29 12/11/2017   GLUCOSE 75 12/11/2017   BUN 13 12/11/2017   CREATININE 0.50 12/11/2017   BILITOT 0.4 12/11/2017   ALKPHOS 37 (L) 09/12/2016   AST 14 12/11/2017   ALT 13 12/11/2017   PROT 8.0 12/19/2017   ALBUMIN 4.8 09/12/2016   CALCIUM 9.4 12/11/2017   GFRAA >60 12/10/2016   QFTBGOLDPLUS NEGATIVE 12/19/2017  December 19, 2017 SPEP normal, immunoglobulins normal, TB Gold negative, HIV negative, hepatitis B-, hepatitis C negative, HLA-B27 negative MRI consistent with sacroiliitis  Speciality Comments: No specialty comments available.  Procedures:  No procedures performed Allergies: Patient has no known allergies.   Assessment / Plan:     Visit Diagnoses: Ankylosing spondylitis of lumbosacral region [facility])- Patient reports that she is having severe pain in her SI joints.  She is having difficulty walking and doing routine activities.  Her x-rays and MRIs were consistent with sacroiliitis.  We had detailed discussion regarding treatment options today.  Indications side effects contraindications were discussed.  She is not using any contraception and is concerned that she could be pregnant.  We discussed possible use of contraception in future.  I also discussed use of Cimzia in case she is pregnant.  Indications side effects contraindications were discussed at length.  Patient wants to proceed with Cimzia.  She is also requesting some pain relief.  We discussed possible steroid injections once we get pregnancy results back.  Medication counseling:   Does patient have diagnosis of heart failure?  No  Counseled patient that Cimzia is a TNF blocking agent.  Reviewed Cimzia dose .  Counseled  patient on purpose, proper use, and adverse effects of Cimzia.  Reviewed the most common adverse effects including infections, headache, and injection site reactions. Discussed that there is the possibility of an increased risk of malignancy but it is not well understood if this increased risk is due to the medication or the disease state.  Advised patient to get yearly dermatology exams due to risk of skin cancer.  Reviewed the importance of regular labs while on Cimzia therapy.  Advised patient to get standing labs one month after starting Cimzia.  Provided patient with standing lab orders.  Counseled patient that Cimzia should be held prior to scheduled surgery.  Counseled patient to avoid live vaccines while on Cimzia.  Advised patient to get annual influenza vaccine and the pneumococcal vaccine as indicated.  Provided patient with medication education material and answered all questions.  Patient voiced understanding.  Patient consented to Cimzia.  Will upload consent into the media tab.  Reviewed storage instructions for Cimzia.  Will apply for Cimzia through patient's insurance.  Advised initial injection must be administered in office.     Sacroiliitis ([place]) - X-rays revealed bilateral SI joint to sclerosis.  MRI consistent with sacroiliitis.  Dry eyes-over-the-counter products were discussed  Amenorrhea -  Plan: B-HCG Quant  Language barrier - Patient is Arabic speaking.  Translator was present during the conversation.  Orders: Orders Placed This Encounter  Procedures  . B-HCG Quant   No orders of the defined types were placed in this encounter.   Face-to-face time spent with patient was 60 minutes. Greater than 50% of time was spent in counseling and coordination of care.  Follow-Up Instructions: Return in about 4 weeks (around 03/02/2018) for Ankylosing spondylitis.   Bo Merino, MD  Note - This record has been created using Editor, commissioning.  Chart creation errors have been  sought, but may not always  have been located. Such creation errors do not reflect on  the standard of medical care.

## 2018-02-02 ENCOUNTER — Encounter: Payer: Self-pay | Admitting: Rheumatology

## 2018-02-02 ENCOUNTER — Ambulatory Visit (INDEPENDENT_AMBULATORY_CARE_PROVIDER_SITE_OTHER): Payer: BLUE CROSS/BLUE SHIELD | Admitting: Rheumatology

## 2018-02-02 ENCOUNTER — Telehealth: Payer: Self-pay | Admitting: Pharmacy Technician

## 2018-02-02 VITALS — BP 93/63 | HR 92 | Resp 14 | Ht 60.6 in | Wt 158.6 lb

## 2018-02-02 DIAGNOSIS — N912 Amenorrhea, unspecified: Secondary | ICD-10-CM

## 2018-02-02 DIAGNOSIS — H04123 Dry eye syndrome of bilateral lacrimal glands: Secondary | ICD-10-CM | POA: Diagnosis not present

## 2018-02-02 DIAGNOSIS — M461 Sacroiliitis, not elsewhere classified: Secondary | ICD-10-CM

## 2018-02-02 DIAGNOSIS — M457 Ankylosing spondylitis of lumbosacral region: Secondary | ICD-10-CM | POA: Diagnosis not present

## 2018-02-02 DIAGNOSIS — Z789 Other specified health status: Secondary | ICD-10-CM

## 2018-02-02 NOTE — Progress Notes (Signed)
Medication counseling:  TB Test: 6/28 negative Hepatitis panel: 6/28 negative HIV: 6/27 negative SPEP: 6/28 Immunoglobulins: 6/28 IgA and IgM WNL, IgG slightly elevated at 1805   Does patient have diagnosis of heart failure?  No  Counseled patient that Cimzia is a TNF blocking agent.  Reviewed Cimzia dose 400 mg subQ (as 2 injections of 200 mg) once and then repeat at weeks 2 and 4, then maintenance dose of 200 mg every 2 weeks or 400 mg once every 4 weeksf.  Counseled patient on purpose, proper use, and adverse effects of Cimzia.  Reviewed the most common adverse effects including infections, headache, and injection site reactions. Patient concerned about pregnancy.  Pregnancy test today and counseled that this medication is safe to use in pregnancy. Discussed that there is the possibility of an increased risk of malignancy but it is not well understood if this increased risk is due to the medication or the disease state.  Advised patient to get yearly dermatology exams due to risk of skin cancer.  Reviewed the importance of regular labs while on Cimzia therapy.  Advised patient to get standing labs one month after starting Cimzia.  Provided patient with standing lab orders.  Counseled patient that Cimzia should be held prior to scheduled surgery.  Counseled patient to avoid live vaccines while on Cimzia.  Advised patient to get annual influenza vaccine and the pneumococcal vaccine as indicated.  Provided patient with medication education material and answered all questions.  Patient voiced understanding.  Patient consented to Cimzia.  Will upload consent into the media tab.  Reviewed storage instructions for Cimzia.  Will apply for Cimzia through patient's insurance.  Advised initial injection must be administered in office.    Mariella Saa, PharmD, [facility] Rheumatology Clinical Pharmacist  02/02/2018 1:07 PM

## 2018-02-02 NOTE — Patient Instructions (Signed)
Certolizumab pegol injection °What is this medicine? °CERTOLIZUMAB (SER toe LIZ oo mab) is used to treat Crohn's disease, psoriatic arthritis, or rheumatoid arthritis. °This medicine may be used for other purposes; ask your health care provider or pharmacist if you have questions. °COMMON BRAND NAME(S): Cimzia °What should I tell my health care provider before I take this medicine? °They need to know if you have any of these conditions: °-diabetes °-heart disease °-hepatitis B or history of hepatitis B infection °-immune system problems °-infection or history of infections °-low blood counts, like low white cell, platelet, or red cell counts °-multiple sclerosis °-recently received or scheduled to receive a vaccine °-scheduled to have surgery °-tuberculosis, a positive skin test for tuberculosis or have recently been in close contact with someone who has tuberculosis °-an unusual or allergic reaction to certolizumab, other medicines, foods, dyes, or preservatives °-pregnant or trying to get pregnant °-breast-feeding °How should I use this medicine? °This medicine is for injection under the skin. It is usually given by a health care professional in a hospital or clinic setting. °If you get this medicine at home, you will be taught how to prepare and give this medicine. Use exactly as directed. Take your medicine at regular intervals. Do not take your medicine more often than directed. °It is important that you put your used needles and syringes in a special sharps container. Do not put them in a trash can. If you do not have a sharps container, call your pharmacist or healthcare provider to get one. °A special MedGuide will be given to you by the pharmacist with each prescription and refill. Be sure to read this information carefully each time. °Talk to your pediatrician regarding the use of this medicine in children. Special care may be needed. °Overdosage: If you think you have taken too much of this medicine  contact a poison control center or emergency room at once. °NOTE: This medicine is only for you. Do not share this medicine with others. °What if I miss a dose? °It is important not to miss your dose. Call your doctor or health care professional if you are unable to keep an appointment. If you give yourself the medicine and you miss a dose, take it as soon as you can. If it is almost time for your next dose, take only that dose. Do not take double or extra doses. °What may interact with this medicine? °Do not take this medicine with any of the following medications: °-abatacept °-adalimumab °-anakinra °-etanercept °-infliximab °-live virus vaccines °-rilonacept °This medicine may also interact with the following medications: °-vaccines °This list may not describe all possible interactions. Give your health care provider a list of all the medicines, herbs, non-prescription drugs, or dietary supplements you use. Also tell them if you smoke, drink alcohol, or use illegal drugs. Some items may interact with your medicine. °What should I watch for while using this medicine? °Visit your doctor or health care professional for regular checks on your progress. Tell your doctor or healthcare professional if your symptoms do not start to get better or if they get worse. Your condition will be monitored carefully while you are receiving this medicine. °You will be tested for tuberculosis (TB) before you start this medicine. If your doctor prescribes any medicine for TB, you should start taking the TB medicine before starting this medicine. Make sure to finish the full course of TB medicine. °Call your doctor or health care professional for advice if you get a fever, chills,   sore throat, or other symptoms of an infection. Do not treat yourself. This medicine may decrease your body's ability to fight infection. Try to avoid being around people who are sick. °Talk to your doctor about your risk of cancer. You may be more at risk  for certain types of cancers if you take this medicine. °What side effects may I notice from receiving this medicine? °Side effects that you should report to your doctor or health care professional as soon as possible: °-allergic reactions like skin rash, itching or hives, swelling of the face, lips, or tongue °-breathing problems °-changes in vision °-chest pain or palpitations °-fever or chills, sore throat °-pain, tingling, numbness in the hands or feet °-red, scaly patches or raised bumps on the skin °-seizures °-swelling of the ankles, feet, hands °-swollen lymph nodes in the neck, underarm, or groin areas °-unexplained weight loss °-unusual bleeding or bruising °-unusually weak or tired °Side effects that usually do not require medical attention (report to your doctor or health care professional if they continue or are bothersome): °-irritation at site where injected °This list may not describe all possible side effects. Call your doctor for medical advice about side effects. You may report side effects to FDA at 1-800-FDA-1088. °Where should I keep my medicine? °Keep out of the reach of children. °If you are using this medicine at home, you will be instructed on how to store this medicine. Throw away any unused medicine after the expiration date on the label. °NOTE: This sheet is a summary. It may not cover all possible information. If you have questions about this medicine, talk to your doctor, pharmacist, or health care provider. °© 2018 Elsevier/Gold Standard (2012-03-25 10:31:30) ° °

## 2018-02-02 NOTE — Telephone Encounter (Signed)
Findings of benefits investigation via test claims:   Insurance: [place] PPO/ [place] Prescription plan  Phone: [phone]  Magnus Sinning- requires prior authorization  Must use [facility].  1:27 PM Beatriz Chancellor, CPhT

## 2018-02-03 ENCOUNTER — Telehealth: Payer: Self-pay | Admitting: Pharmacy Technician

## 2018-02-03 LAB — HCG, QUANTITATIVE, PREGNANCY: HCG, Total, QN: <2 m[IU]/mL

## 2018-02-03 NOTE — Progress Notes (Signed)
hCG is negative.  Please advise patient to use contraception case she wants to provide for future pregnancy.

## 2018-02-03 NOTE — Telephone Encounter (Signed)
Received a Prior Authorization request from [employer] for [employer]. Authorization has been submitted to patient's insurance via Cover My Meds. Will update once we receive a response.   8:17 AM Beatriz Chancellor, CPhT

## 2018-02-04 NOTE — Telephone Encounter (Signed)
Received a fax regarding Prior Authorization for Cimzia. Authorization has been DENIED because patient is not currently pregnant or breastfeeding; patient must have tried and failed Cosentyx, Enbrel, and Humira;or patient is not currently receiving medication, but not via PAPor samples.  Will send document to scan center.  [facility] Phone# [phone]  8:19 AM Beatriz Chancellor, CPhT

## 2018-02-05 ENCOUNTER — Other Ambulatory Visit: Payer: Self-pay | Admitting: Pharmacist

## 2018-02-05 NOTE — Telephone Encounter (Signed)
Cimzia appeal sent to insurance company.  Will update upon response.

## 2018-02-10 NOTE — Telephone Encounter (Signed)
Received call from [place] from [place] department requesting more information for Cimzia: If the patient has tried and failed or has intolerance to at least 2 non-steroidal medications. They can see patient has tried Diclofenac in the past. Linus Orn also requested appeal letter to be refaxed over, it was not completely received. Rph Amber is following up.  11:51 AM Beatriz Chancellor, CPhT

## 2018-02-10 NOTE — Telephone Encounter (Signed)
Update appeal to reflect failure of diclofenac and naproxen.  Sent fax to [place] at [employer].  Will update when we receive a response.

## 2018-02-12 ENCOUNTER — Other Ambulatory Visit: Payer: Self-pay | Admitting: Pharmacist

## 2018-02-12 DIAGNOSIS — M457 Ankylosing spondylitis of lumbosacral region: Secondary | ICD-10-CM

## 2018-02-12 NOTE — Progress Notes (Signed)
Okay to apply for Enbrel

## 2018-02-12 NOTE — Progress Notes (Signed)
First Level Appeal denied for Cimzia.  Will discuss next steps with Dr. Estanislado Pandy.

## 2018-02-13 MED ORDER — ETANERCEPT 50 MG/ML ~~LOC~~ SOCT: 50.0000 mg | SUBCUTANEOUS | 0 refills | Status: DC

## 2018-02-13 NOTE — Progress Notes (Signed)
RX for Enbrel Mini sent to [facility].  Will follow up if it requires prior authorization and update patient as needed.

## 2018-02-13 NOTE — Addendum Note (Signed)
Addended by: Mariella Saa C on: 02/13/2018 08:17 AM   Modules accepted: Orders

## 2018-02-16 ENCOUNTER — Telehealth: Payer: Self-pay | Admitting: Pharmacy Technician

## 2018-02-16 NOTE — Telephone Encounter (Signed)
Enbrel approved.  Has upcoming appointment with Lovena Le on 9/9.  Would you like to move the appointment up in order to do consent and initial dose visit or wait until the 9th?

## 2018-02-16 NOTE — Progress Notes (Deleted)
   Office Visit Note  Patient: Kathleen Nguyen             Date of Birth: 27-Jan-1986           MRN: [id number]             PCP: Clent Demark, PA-C Referring: Clent Demark, PA-C Visit Date: 03/02/2018 Occupation: @GUAROCC @  Subjective:  No chief complaint on file.   History of Present Illness: Kathleen Nguyen is a 32 y.o. female ***   Activities of Daily Living:  Patient reports morning stiffness for *** {minute/hour:19697}.   Patient {ACTIONS;DENIES/REPORTS:21021675::"Denies"} nocturnal pain.  Difficulty dressing/grooming: {ACTIONS;DENIES/REPORTS:21021675::"Denies"} Difficulty climbing stairs: {ACTIONS;DENIES/REPORTS:21021675::"Denies"} Difficulty getting out of chair: {ACTIONS;DENIES/REPORTS:21021675::"Denies"} Difficulty using hands for taps, buttons, cutlery, and/or writing: {ACTIONS;DENIES/REPORTS:21021675::"Denies"}  No Rheumatology ROS completed.   PMFS History:  Patient Active Problem List   Diagnosis Date Noted  . Uterine adenomyoma 12/11/2016  . Tinea pedis of left foot 12/05/2016  . Pelvic pain 09/23/2016    Past Medical History:  Diagnosis Date  . Rheumatoid arthritis ([place])     No family history on file. Past Surgical History:  Procedure Laterality Date  . CESAREAN SECTION    . CESAREAN SECTION     Social History   Social History Narrative  . Not on file    Objective: Vital Signs: There were no vitals taken for this visit.   Physical Exam   Musculoskeletal Exam: ***  CDAI Exam: CDAI Score: Not documented Patient Global Assessment: Not documented; Provider Global Assessment: Not documented Swollen: Not documented; Tender: Not documented Joint Exam   Not documented   There is currently no information documented on the homunculus. Go to the Rheumatology activity and complete the homunculus joint exam.  Investigation: No additional findings.  Imaging: No results found.  Recent Labs: Lab Results  Component Value Date   WBC 4.5  12/19/2017   HGB 12.3 12/19/2017   PLT 354 12/19/2017   NA 138 12/11/2017   K 4.5 12/11/2017   CL 102 12/11/2017   CO2 29 12/11/2017   GLUCOSE 75 12/11/2017   BUN 13 12/11/2017   CREATININE 0.50 12/11/2017   BILITOT 0.4 12/11/2017   ALKPHOS 37 (L) 09/12/2016   AST 14 12/11/2017   ALT 13 12/11/2017   PROT 8.0 12/19/2017   ALBUMIN 4.8 09/12/2016   CALCIUM 9.4 12/11/2017   GFRAA >60 12/10/2016   QFTBGOLDPLUS NEGATIVE 12/19/2017    Speciality Comments: No specialty comments available.  Procedures:  No procedures performed Allergies: Patient has no known allergies.   Assessment / Plan:     Visit Diagnoses: Ankylosing spondylitis of lumbosacral region ([place])  Sacroiliitis ([place])  High risk medication use - Enbrel  Tinea pedis of left foot  Uterine adenomyoma  Language barrier   Orders: No orders of the defined types were placed in this encounter.  No orders of the defined types were placed in this encounter.   Face-to-face time spent with patient was *** minutes. Greater than 50% of time was spent in counseling and coordination of care.  Follow-Up Instructions: No follow-ups on file.   Ofilia Neas, PA-C  Note - This record has been created using Dragon software.  Chart creation errors have been sought, but may not always  have been located. Such creation errors do not reflect on  the standard of medical care.

## 2018-02-16 NOTE — Telephone Encounter (Signed)
Received a fax from [place] regarding a prior authorization for Enbrel Mini. Authorization has been APPROVED from 02/16/18 to 02/17/20.   Will send document to scan center.  Authorization # [id number] Phone # [phone]  Per Insurance, patient must use [facility].  3:32 PM Beatriz Chancellor, CPhT

## 2018-02-16 NOTE — Telephone Encounter (Signed)
Received a Prior Authorization request from [employer] for Enbrel mini. Authorization has been submitted to patient's insurance via Cover My Meds. Will update once we receive a response.  9:33 AM Beatriz Chancellor, CPhT

## 2018-02-17 ENCOUNTER — Ambulatory Visit: Payer: BLUE CROSS/BLUE SHIELD | Admitting: Family Medicine

## 2018-02-17 NOTE — Telephone Encounter (Signed)
Follow up appointment has been moved to 02/17/18 at 3:00 pm.

## 2018-02-17 NOTE — Telephone Encounter (Signed)
If there is an appointment available we can see her sooner.

## 2018-02-17 NOTE — Telephone Encounter (Signed)
Cimzia appeal was denied.  Enbrel has been approved.  Can we move her NPT F/U appointment up for consent and first dose sooner than the 9th?  Thank you.

## 2018-02-17 NOTE — Progress Notes (Signed)
Office Visit Note  Patient: Kathleen Nguyen             Date of Birth: 04/19/1986           MRN: [id number]             PCP: Clent Demark, PA-C Referring: Clent Demark, PA-C Visit Date: 02/18/2018 Occupation: @GUAROCC @  Interpreter: Milbert Coulter  Subjective:  Lower back pain.   History of Present Illness: Kathleen Nguyen is a 32 y.o. female history of ankylosing spondylitis.  She continues to have lower back pain and SI joint pain.  She continues to have some problems with dry eyes and blurred vision.  She has been advised to see ophthalmologist.  She is planning pregnancy in the near future.  Unfortunately, the insurance company did not approve Cimzia.  She has been approved for Enbrel.  Activities of Daily Living:  Patient reports morning stiffness for 30 minutes.   Patient Denies nocturnal pain.  Difficulty dressing/grooming: Denies Difficulty climbing stairs: Reports Difficulty getting out of chair: Reports Difficulty using hands for taps, buttons, cutlery, and/or writing: Denies  Review of Systems  Constitutional: Positive for fatigue. Negative for night sweats, weight gain and weight loss.  HENT: Positive for mouth dryness. Negative for mouth sores, trouble swallowing, trouble swallowing and nose dryness.   Eyes: Positive for dryness. Negative for pain, redness and visual disturbance.  Respiratory: Negative for cough, shortness of breath and difficulty breathing.   Cardiovascular: Positive for palpitations. Negative for chest pain, hypertension, irregular heartbeat and swelling in legs/feet.  Gastrointestinal: Negative for blood in stool, constipation and diarrhea.  Endocrine: Negative for increased urination.  Genitourinary: Negative for vaginal dryness.  Musculoskeletal: Positive for arthralgias, joint pain and morning stiffness. Negative for joint swelling, myalgias, muscle weakness, muscle tenderness and myalgias.  Skin: Negative for color change, rash, hair  loss, skin tightness, ulcers and sensitivity to sunlight.  Allergic/Immunologic: Negative for susceptible to infections.  Neurological: Negative for dizziness, memory loss, night sweats and weakness.  Hematological: Negative for swollen glands.  Psychiatric/Behavioral: Positive for sleep disturbance. Negative for depressed mood. The patient is not nervous/anxious.     PMFS History:  Patient Active Problem List   Diagnosis Date Noted  . Uterine adenomyoma 12/11/2016  . Tinea pedis of left foot 12/05/2016  . Pelvic pain 09/23/2016    Past Medical History:  Diagnosis Date  . Rheumatoid arthritis ([place])     History reviewed. No pertinent family history. Past Surgical History:  Procedure Laterality Date  . CESAREAN SECTION    . CESAREAN SECTION     Social History   Social History Narrative  . Not on file    Objective: Vital Signs: BP 114/80   Pulse 91   Resp 13   Ht 5' 3.5" (1.613 m)   Wt 157 lb (71.2 kg)   BMI 27.38 kg/m    Physical Exam  Constitutional: She is oriented to person, place, and time. She appears well-developed and well-nourished.  HENT:  Head: Normocephalic and atraumatic.  Eyes: Conjunctivae and EOM are normal.  Neck: Normal range of motion.  Cardiovascular: Normal rate, regular rhythm, normal heart sounds and intact distal pulses.  Pulmonary/Chest: Effort normal and breath sounds normal.  Abdominal: Soft. Bowel sounds are normal.  Lymphadenopathy:    She has no cervical adenopathy.  Neurological: She is alert and oriented to person, place, and time.  Skin: Skin is warm and dry. Capillary refill takes less than 2 seconds.  Psychiatric: She has  a normal mood and affect. Her behavior is normal.  Nursing note and vitals reviewed.    Musculoskeletal Exam: C-spine is stiffness with range of motion.  Thoracic spine was in good range of motion.  She had discomfort in the lumbar spine.  She had tenderness over bilateral SI joints.  Shoulder joints elbow  joints wrist joint MCPs PIPs DIPs were in good range of motion with no synovitis.   CDAI Exam: CDAI Score: Not documented Patient Global Assessment: Not documented; Provider Global Assessment: Not documented Swollen: Not documented; Tender: Not documented Joint Exam   Not documented   There is currently no information documented on the homunculus. Go to the Rheumatology activity and complete the homunculus joint exam.  Investigation: No additional findings.  Imaging: No results found.  Recent Labs: Lab Results  Component Value Date   WBC 4.5 12/19/2017   HGB 12.3 12/19/2017   PLT 354 12/19/2017   NA 138 12/11/2017   K 4.5 12/11/2017   CL 102 12/11/2017   CO2 29 12/11/2017   GLUCOSE 75 12/11/2017   BUN 13 12/11/2017   CREATININE 0.50 12/11/2017   BILITOT 0.4 12/11/2017   ALKPHOS 37 (L) 09/12/2016   AST 14 12/11/2017   ALT 13 12/11/2017   PROT 8.0 12/19/2017   ALBUMIN 4.8 09/12/2016   CALCIUM 9.4 12/11/2017   GFRAA >60 12/10/2016   QFTBGOLDPLUS NEGATIVE 12/19/2017    Speciality Comments: No specialty comments available.  Procedures:  No procedures performed Allergies: Patient has no known allergies.   Assessment / Plan:     Visit Diagnoses: Ankylosing spondylitis of lumbosacral region (HCC)-she continues to have lower back pain and SI joint discomfort.  She is not having a flare currently.  But she has significant discomfort.  She also have neck is stiffness.  Her insurance did not approve Cimzia.  She is planning pregnancy.  Enbrel has been approved by her insurance.  Detailed counseling and will was provided.  Handout was given and consent was taken.  The plan is to give her first injection of Enbrel today.  We also instructed her once she get pregnant we can switch her from Enbrel to Cimzia.  Patient was given her first Enbrel injection in the office today.  She was observed in the office for 30 minutes.  Medication counseling:    Does patient have diagnosis of  heart failure?  No  Counseled patient that Enbrel is a TNF blocking agent.  Reviewed Enbrel dose of 50 mg once weekly.  Counseled patient on purpose, proper use, and adverse effects of Enbrel.  Reviewed the most common adverse effects including infections, headache, and injection site reactions. Discussed that there is the possibility of an increased risk of malignancy but it is not well understood if this increased risk is due to the medication or the disease state.  Advised patient to get yearly dermatology exams due to risk of skin cancer.  Reviewed the importance of regular labs while on Enbrel therapy.  Advised patient to get standing labs one month after starting Enbrel then every 2 months.  Provided patient with standing lab orders.  Counseled patient that Enbrel should be held prior to scheduled surgery.  Counseled patient to avoid live vaccines while on Enbrel.  Advised patient to get annual influenza vaccine and the pneumococcal vaccine as needed.  Provided patient with medication education material and answered all questions.  Patient voiced understanding.  Patient consented to Enbrel.  Will upload consent into the media tab.  Reviewed  storage instructions for Enbrel.  Advised initial injection must be administered in office.  Patient voiced understanding.    Sacroiliitis (HCC)-she has ongoing discomfort in her lower back.  Amenorrhea- she misses her periods frequently.  Dry eyes-she complains of dry eyes and blurred vision.  We will refer her to ophthalmology for evaluation.  She also gives history of intermittent palpitations.  Have advised her to discuss this further with her PCP.  Language barrier - Patient is Arabic speaking.   Orders: Orders Placed This Encounter  Procedures  . CBC with Differential/Platelet  . COMPLETE METABOLIC PANEL WITH GFR  . Ambulatory referral to Ophthalmology   Meds ordered this encounter  Medications  . Etanercept SOCT 50 mg  . DISCONTD: Etanercept  SOCT 50 mg  . Etanercept (ENBREL MINI) 50 MG/ML SOCT    Sig: Inject 50 mg into the skin once a week.    Dispense:  4 Cartridge    Refill:  0    Face-to-face time spent with patient was 90 minutes. Greater than 50% of time was spent in counseling and coordination of care.  Follow-Up Instructions: Return in about 3 months (around 05/21/2018) for Ankylosing spondylitis.   Bo Merino, MD  Note - This record has been created using Editor, commissioning.  Chart creation errors have been sought, but may not always  have been located. Such creation errors do not reflect on  the standard of medical care.

## 2018-02-18 ENCOUNTER — Ambulatory Visit (INDEPENDENT_AMBULATORY_CARE_PROVIDER_SITE_OTHER): Payer: BLUE CROSS/BLUE SHIELD | Admitting: Rheumatology

## 2018-02-18 ENCOUNTER — Encounter: Payer: Self-pay | Admitting: Rheumatology

## 2018-02-18 VITALS — BP 114/80 | HR 91 | Resp 13 | Ht 63.5 in | Wt 157.0 lb

## 2018-02-18 DIAGNOSIS — H04123 Dry eye syndrome of bilateral lacrimal glands: Secondary | ICD-10-CM

## 2018-02-18 DIAGNOSIS — M457 Ankylosing spondylitis of lumbosacral region: Secondary | ICD-10-CM | POA: Diagnosis not present

## 2018-02-18 DIAGNOSIS — M461 Sacroiliitis, not elsewhere classified: Secondary | ICD-10-CM | POA: Diagnosis not present

## 2018-02-18 DIAGNOSIS — H538 Other visual disturbances: Secondary | ICD-10-CM

## 2018-02-18 DIAGNOSIS — N912 Amenorrhea, unspecified: Secondary | ICD-10-CM | POA: Diagnosis not present

## 2018-02-18 DIAGNOSIS — Z79899 Other long term (current) drug therapy: Secondary | ICD-10-CM

## 2018-02-18 DIAGNOSIS — Z789 Other specified health status: Secondary | ICD-10-CM

## 2018-02-18 MED ORDER — ETANERCEPT 50 MG/ML ~~LOC~~ SOCT: 50.0000 mg | SUBCUTANEOUS | 0 refills | Status: DC

## 2018-02-18 MED ORDER — ETANERCEPT 50 MG/ML ~~LOC~~ SOCT: 50.0000 mg | Freq: Once | SUBCUTANEOUS | Status: DC

## 2018-02-18 MED ORDER — ETANERCEPT 50 MG/ML ~~LOC~~ SOCT
50.0000 mg | Freq: Once | SUBCUTANEOUS | Status: AC
  Administered 2018-02-18: 50 mg via SUBCUTANEOUS

## 2018-02-18 NOTE — Patient Instructions (Signed)
Next Enbrel Dose September 11th, 2019  Follow Up Appointment Please schedule a follow up appointment in three months.  Standing Labs We placed an order today for your standing lab work.    Please come back and get your standing labs in 1 month then every 3 months.  We have open lab Monday through Friday from 8:30-11:30 AM and 1:30-4:00 PM  at the office of Dr. Bo Merino.   You may experience shorter wait times on Monday and Friday afternoons. The office is located at [address] [place], [place],  [address] No appointment is necessary.   Labs are drawn by [facility].  You may receive a bill from [facility] for your lab work. If you have any questions regarding directions or hours of operation,  please call [phone].    Natural anti-inflammatories  You can purchase these at [employer], [employer] or online.  . Turmeric (capsules)  . Ginger (ginger root or capsules)  . Omega 3 (Fish, flax seeds, chia seeds, walnuts, almonds)  . Tart cherry (dried or extract)   Patient should be under the care of a physician while taking these supplements. This may not be reproduced without the permission of Dr. Bo Merino.

## 2018-02-18 NOTE — Progress Notes (Signed)
Pharmacy Note  Subjective: Patient presents today to the [facility] to see Dr. Estanislado Pandy.   Patient seen by the pharmacist for counseling on Enbrel.    Objective: TB Test: negative 12/19/17 Hepatitis panel: negative 12/19/17 HIV: negative 12/19/17 SPEP: within normal limits 12/19/17 Immunoglobulins: within normal limits 12/19/17  Chest x-ray: No active cardiopulmonary disease 12/19/17  CBC    Component Value Date/Time   WBC 4.5 12/19/2017 0859   RBC 4.46 12/19/2017 0859   HGB 12.3 12/19/2017 0859   HGB 12.0 09/12/2016 1124   HCT 38.5 12/19/2017 0859   HCT 36.5 09/12/2016 1124   PLT 354 12/19/2017 0859   MCV 86.3 12/19/2017 0859   MCV 84 09/12/2016 1124   MCH 27.6 12/19/2017 0859   MCHC 31.9 (L) 12/19/2017 0859   RDW 12.6 12/19/2017 0859   RDW 14.0 09/12/2016 1124   LYMPHSABS 1,494 12/19/2017 0859   LYMPHSABS 1.4 09/12/2016 1124   EOSABS 32 12/19/2017 0859   EOSABS 0.0 09/12/2016 1124   BASOSABS 18 12/19/2017 0859   BASOSABS 0.0 09/12/2016 1124    Does patient have diagnosis of heart failure?  No  Assessment/Plan:  Counseled patient that Enbrel is a TNF blocking agent.  Reviewed Enbrel dose of 50 mg once weekly.  Counseled patient on purpose, proper use, and adverse effects of Enbrel.  Reviewed the most common adverse effects including infections, headache, and injection site reactions. Discussed that there is the possibility of an increased risk of malignancy but it is not well understood if this increased risk is due to the medication or the disease state.  Advised patient to get yearly dermatology exams due to risk of skin cancer.  Reviewed the importance of regular labs while on Enbrel therapy.  Advised patient to get standing labs one month after starting Enbrel then every 2 months.  Provided patient with standing lab orders.  Counseled patient that Enbrel should be held prior to scheduled surgery.  Counseled patient to avoid live vaccines while on Enbrel.   Advised patient to get annual influenza vaccine and the pneumococcal vaccine as needed.  Provided patient with medication education material and answered all questions.  Patient voiced understanding.  Patient consented to Enbrel.  Will upload consent into the media tab.  Reviewed storage instructions for Enbrel.  Advised initial injection must be administered in office.  Patient voiced understanding.    Demonstrated proper injection technique with Enbrel demo pen.  Patient able to demonstrate proper injection technique using the teach back method.  Patient self injected in the lower right abdomen with:  Sample Medication: Enbrel Mini NDC: [address]-69 Lot: [id number] Expiration: 08/2019  Patient tolerated well.  Observed for 30 mins in office for adverse reaction and no allergic or localized reaction noted. Instructed patient to call with any questions/issues.

## 2018-02-19 ENCOUNTER — Encounter: Payer: Self-pay | Admitting: *Deleted

## 2018-03-02 ENCOUNTER — Ambulatory Visit: Payer: BLUE CROSS/BLUE SHIELD | Admitting: Physician Assistant

## 2018-03-05 ENCOUNTER — Telehealth: Payer: Self-pay | Admitting: Pharmacy Technician

## 2018-03-05 NOTE — Telephone Encounter (Signed)
Received voicemail last night from patient. She was having trouble with her Enbrel mini injection. Patient stated the medication did not come out. Will reach out to [place] Nurse Partner to see if she can assist patient, as she speaks Arabic. Will follow up with patient.  Please advise.  8:21 AM Beatriz Chancellor, CPhT

## 2018-03-05 NOTE — Telephone Encounter (Signed)
Left message for [employer] Nurse. Left message for patient to advise. Will follow up.  2:30 PM Beatriz Chancellor, CPhT

## 2018-03-09 ENCOUNTER — Encounter: Payer: Self-pay | Admitting: Obstetrics and Gynecology

## 2018-03-09 ENCOUNTER — Ambulatory Visit (INDEPENDENT_AMBULATORY_CARE_PROVIDER_SITE_OTHER): Payer: BLUE CROSS/BLUE SHIELD | Admitting: Obstetrics and Gynecology

## 2018-03-09 VITALS — BP 105/71 | HR 82 | Ht 63.0 in | Wt 159.0 lb

## 2018-03-09 DIAGNOSIS — N8003 Adenomyosis of the uterus: Secondary | ICD-10-CM

## 2018-03-09 DIAGNOSIS — N800 Endometriosis of uterus: Secondary | ICD-10-CM | POA: Diagnosis not present

## 2018-03-09 DIAGNOSIS — N809 Endometriosis, unspecified: Principal | ICD-10-CM

## 2018-03-09 MED ORDER — IBUPROFEN 600 MG PO TABS: 600.0000 mg | ORAL_TABLET | Freq: Four times a day (QID) | ORAL | 1 refills | Status: DC | PRN

## 2018-03-09 MED ORDER — METFORMIN HCL 500 MG PO TABS: 1000.0000 mg | ORAL_TABLET | Freq: Two times a day (BID) | ORAL | 5 refills | Status: DC

## 2018-03-09 NOTE — Telephone Encounter (Signed)
Spoke to [place] and she gave information for patient to get on her schedule. Patient must call and enroll into the Enbrel Support program, [phone]. Nurse would not be able to see patient by this week for her next injection. Will call and schedule nurse visit at the office.  9:24 AM Beatriz Chancellor, CPhT

## 2018-03-09 NOTE — Progress Notes (Signed)
32 yo P2 returning for the evaluation of dysmenorrhea. Patient reports a history of irregular menses, often skipping months. She states that over the past few months, her periods have occurred monthly and lasting 6-7 days. She describes her periods as being painful. Patient was diagnosed with adenomyosis and PCOS. She desires to conceive pregnancy and was prescribed metformin by her PCP. Patient discontinued metformin due to GI side effects. Patient is requesting treatment for her pain  Past Medical History:  Diagnosis Date  . Rheumatoid arthritis [facility])    Past Surgical History:  Procedure Laterality Date  . CESAREAN SECTION    . CESAREAN SECTION     History reviewed. No pertinent family history. Social History   Tobacco Use  . Smoking status: Never Smoker  . Smokeless tobacco: Never Used  Substance Use Topics  . Alcohol use: No    Frequency: Never  . Drug use: No   ROS See pertinent in HPI Blood pressure 105/71, pulse 82, height 5\' 3"  (1.6 m), weight 159 lb (72.1 kg), last menstrual period 03/04/2018.  GENERAL: Well-developed, well-nourished female in no acute distress.  LUNGS: Clear to auscultation bilaterally.  HEART: Regular rate and rhythm. ABDOMEN: Soft, nontender, nondistended. No organomegaly. PELVIC: Normal external female genitalia. Vagina is pink and rugated.  Normal discharge. Normal appearing cervix. Uterus is normal in size. No adnexal mass or tenderness. EXTREMITIES: No cyanosis, clubbing, or edema, 2+ distal pulses.  2018 pelvic ultrasound and MRI reviewed  A/P 32 yo with adenomyosis and PCOS here for pelvic pain - 30 minutes was spent explaining adenomyosis and PCOS to the patient - Discussed treatment options with contraception. Since patient is trying to conceive, I reviewed optimal timing of intercourse with the patient - Discussed ibuprofen a few days before her menses and during the first 2 days of her menses to ease dysmenorrhea - Reviewed the benefits of  metformin in helping with ovulation and conception - Advised patient to take prenatal vitamins - Normal pap smear in 2018 - RTC prn

## 2018-03-11 ENCOUNTER — Ambulatory Visit (INDEPENDENT_AMBULATORY_CARE_PROVIDER_SITE_OTHER): Payer: BLUE CROSS/BLUE SHIELD | Admitting: Pharmacist

## 2018-03-11 DIAGNOSIS — M457 Ankylosing spondylitis of lumbosacral region: Secondary | ICD-10-CM

## 2018-03-11 NOTE — Progress Notes (Signed)
Pharmacy Counseling Note  Patient presented to [facility] today for Enbrel mini injection technique retraining.  First dose was injected by patient in office without any issues.  Next dose was given by patient at home where she reported that the needle was injected but none of the medication was injected.  She brought back the cartridge that she originally tried to inject and it was still full of medication.  Reviewed injection technique again and patient was able to self inject without issue in office today with patient supplied medication.  Enbrel Mini NDC: [id number] Lot: [id number] Expiration: 08/2019  Unsure if it is a problem with patient injection technique, cartridge, or autoinjector.  Patient has been able to demonstrate proper injection technique twice in office.  Gave patient new autoinjector in case it is a mechanical issue.  All questions were encouraged and answered.  Instructed patient to call office if she has trouble injecting the next cartridge or has any issues obtaining the medication from the pharmacy.  Mariella Saa, PharmD, [facility] Rheumatology Clinical Pharmacist  03/11/2018 11:37 AM

## 2018-03-18 ENCOUNTER — Encounter (INDEPENDENT_AMBULATORY_CARE_PROVIDER_SITE_OTHER): Payer: Self-pay | Admitting: Specialist

## 2018-03-18 ENCOUNTER — Ambulatory Visit (INDEPENDENT_AMBULATORY_CARE_PROVIDER_SITE_OTHER): Payer: BLUE CROSS/BLUE SHIELD | Admitting: Specialist

## 2018-03-18 VITALS — BP 114/80 | HR 91 | Ht 63.75 in | Wt 158.0 lb

## 2018-03-18 DIAGNOSIS — M533 Sacrococcygeal disorders, not elsewhere classified: Secondary | ICD-10-CM | POA: Diagnosis not present

## 2018-03-18 MED ORDER — ARTIFICIAL TEARS OPHTHALMIC OINT: TOPICAL_OINTMENT | OPHTHALMIC | 6 refills | Status: DC | PRN

## 2018-03-18 NOTE — Progress Notes (Signed)
Office Visit Note   Patient: Kathleen Nguyen           Date of Birth: May 24, 1986           MRN: [id number] Visit Date: 03/18/2018              Requested by: Clent Demark, PA-C [place], [place] [address] PCP: Clent Demark, PA-C   Assessment & Plan: Visit Diagnoses:  1. Sacroiliac joint disease    Excellent response to medical treatment. 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 Request medication for dryness of the eyes. Plan: Avoid frequent bending and stooping  No lifting greater than 10 lbs. May use ice or moist heat for pain. Weight loss is of benefit. Best medication for lumbar disc disease is arthritis medications like motrin, celebrex and naprosyn. Exercise is important to improve your indurance and does allow people to function better inspite of back pain. Will follow up with the medical treatment of ankylosing arthritis. Follow-Up Instructions: No follow-ups on file.   Orders:  No orders of the defined types were placed in this encounter.  No orders of the defined types were placed in this encounter.     Procedures: No procedures performed   Clinical Data: No additional findings.   Subjective: Chief Complaint  Patient presents with  . Lower Back - Follow-up    32 year old female with history of severe back and hip pain she is seeing Dr. Estanislado Pandy and has started embril with excellent improvement in her pain.    Review of Systems  Constitutional: Negative.   HENT: Negative.   Eyes: Negative.   Respiratory: Negative.   Cardiovascular: Negative.   Gastrointestinal: Negative.   Endocrine: Negative.   Genitourinary: Negative.   Musculoskeletal: Negative.   Skin: Negative.   Allergic/Immunologic: Negative.   Neurological: Negative.   Hematological: Negative.   Psychiatric/Behavioral: Negative.      Objective: Vital Signs: Ht 5' 3.75" (1.619 m)   Wt 158 lb (71.7 kg)   LMP 03/04/2018   BMI 27.33 kg/m   Physical  Exam  Constitutional: She is oriented to person, place, and time. She appears well-developed and well-nourished.  HENT:  Head: Normocephalic and atraumatic.  Eyes: Pupils are equal, round, and reactive to light. EOM are normal.  Neck: Normal range of motion. Neck supple.  Pulmonary/Chest: Effort normal and breath sounds normal.  Abdominal: Soft. Bowel sounds are normal.  Neurological: She is alert and oriented to person, place, and time.  Skin: Skin is warm and dry.  Psychiatric: She has a normal mood and affect. Her behavior is normal. Judgment and thought content normal.    Back Exam   Tenderness  The patient is experiencing tenderness in the lumbar.  Range of Motion  Extension: normal  Flexion: normal  Lateral bend right: normal  Lateral bend left: normal  Rotation right: normal  Rotation left: normal   Muscle Strength  Right Quadriceps:  5/5  Left Quadriceps:  5/5  Right Hamstrings:  5/5  Left Hamstrings:  5/5   Tests  Straight leg raise right: negative Straight leg raise left: negative  Reflexes  Patellar: normal Achilles: normal Biceps: normal Babinski's sign: normal   Other  Toe walk: normal Heel walk: normal Sensation: normal Gait: normal  Erythema: no back redness Scars: absent  Comments:  Figure of four hips negative for replication of pain.       Specialty Comments:  No specialty comments available.  Imaging: No results found.  PMFS History: Patient Active Problem List   Diagnosis Date Noted  . Uterine adenomyoma 12/11/2016  . Tinea pedis of left foot 12/05/2016  . Pelvic pain 09/23/2016   Past Medical History:  Diagnosis Date  . Rheumatoid arthritis ([place])     History reviewed. No pertinent family history.  Past Surgical History:  Procedure Laterality Date  . CESAREAN SECTION    . CESAREAN SECTION     Social History   Occupational History  . Not on file  Tobacco Use  . Smoking status: Never Smoker  . Smokeless tobacco:  Never Used  Substance and Sexual Activity  . Alcohol use: No    Frequency: Never  . Drug use: No  . Sexual activity: Yes

## 2018-03-18 NOTE — Patient Instructions (Addendum)
Avoid frequent bending and stooping  No lifting greater than 10 lbs. May use ice or moist heat for pain. Weight loss is of benefit. Best medication for lumbar disc disease is arthritis medications like motrin, celebrex and naprosyn. Exercise is important to improve your indurance and does allow people to function better inspite of back pain.  Will follow up with the medical treatment of ankylosing arthritis.

## 2018-03-20 ENCOUNTER — Other Ambulatory Visit: Payer: Self-pay | Admitting: Pharmacist

## 2018-03-20 NOTE — Progress Notes (Unsigned)
Received fax from [place] stating Enbrel requires Prior Authorization.  Submitted request via covermymeds.  Will send document to scan center.  Will update once we receive a response.

## 2018-03-23 ENCOUNTER — Telehealth: Payer: Self-pay | Admitting: Pharmacy Technician

## 2018-03-23 NOTE — Telephone Encounter (Signed)
Patient left voicemail wanting to know where to call to refill her medication. Called patient, left message with [facility] phone number. Advised her to call back if she has any other questions.  Phone- [phone]  8:57 AM Kathleen Nguyen, CPhT

## 2018-03-23 NOTE — Telephone Encounter (Signed)
Patient called back and said she is having trouble activating her copay card.She said she was told she did not provide enough numbers to activate. Provided patient with Enbrel Support number. Patient also stated that she is due to inject tomorrow. Told patient that after she calls pharmacy, and they are unable to overnight ship to her. For her to call office back, to see about a sample.  Enbrel# [id number]  11:11 AM Beatriz Chancellor, CPhT

## 2018-03-24 NOTE — Telephone Encounter (Signed)
Patient came in to office and received sample of Enbrel Mini. I also activated patient's copay card.  3:58 PM Beatriz Chancellor, CPhT

## 2018-03-24 NOTE — Telephone Encounter (Signed)
Spoke to patient, she was not able to get copay card set up and order scheduled. Patient will come into office today for sample and we will assist patient with copay card and ordering from [facility].  11:39 AM Kathleen Nguyen, CPhT

## 2018-03-30 NOTE — Telephone Encounter (Signed)
Patient left voicemail requesting [facility] pharmacy's phone number. Called patient back and provided number. Patient will call this morning to schedule shipment. Will call back if she has any issues.  8:25 AM Beatriz Chancellor, CPhT

## 2018-03-31 ENCOUNTER — Telehealth: Payer: Self-pay | Admitting: Rheumatology

## 2018-03-31 NOTE — Telephone Encounter (Signed)
Called [facility] to see if patient scheduled shipment. Per rep, patient has not called back to complete new patient enrollment call. Patient also needs to give copay card information. Called patient, she said she will call today, and will call me back with any questions. Reminded patient that she can request a Optometrist with pharmacy.  10:14 AM Beatriz Chancellor, CPhT

## 2018-03-31 NOTE — Telephone Encounter (Signed)
Patient called stating she has questions regarding her bloodwork and also states she is having a difficult time calling the specialty pharmacy for her prescription of Enbrel.  Patient requested a return call.

## 2018-03-31 NOTE — Telephone Encounter (Signed)
Ok to schedule nurse visit.

## 2018-04-01 NOTE — Telephone Encounter (Signed)
Left message for patient

## 2018-04-01 NOTE — Telephone Encounter (Signed)
Left message for patient to call back to schedule nurse visit to assist with her new patient phone call with [facility]. Patient will also need to get an Enbrel sample.  9:32 AM Beatriz Chancellor, CPhT

## 2018-04-02 NOTE — Telephone Encounter (Signed)
Spoke to patient and scheduled nurse visit to setup [facility] shipping, and for patient to receive an Enbrel Mini sample.  12:38 PM Kathleen Nguyen, CPhT

## 2018-04-02 NOTE — Telephone Encounter (Signed)
Patient is also due for lab work.

## 2018-04-03 NOTE — Progress Notes (Signed)
Pharmacy Note Patient presents to [facility] with interpreter for assistance with set up to specialty pharmacy.  Apolonio Schneiders, specialty pharmacy patient advocate, assisted patient during phone call to have shipment sent to our office per patient preference.  She will change shipment option once [facility].  Patient has also had difficulty injecting in the past.  We set her up with Enbrel support for future issues after hours.  Labs drawn today.  We will continue to monitor.  All questions encouraged and answered.  Instructed patient to call with any questions or concerns.  Mariella Saa, PharmD, [facility] Rheumatology Clinical Pharmacist  04/06/2018 11:46 AM

## 2018-04-06 ENCOUNTER — Ambulatory Visit (INDEPENDENT_AMBULATORY_CARE_PROVIDER_SITE_OTHER): Payer: BLUE CROSS/BLUE SHIELD | Admitting: Pharmacist

## 2018-04-06 DIAGNOSIS — Z79899 Other long term (current) drug therapy: Secondary | ICD-10-CM

## 2018-04-06 NOTE — Telephone Encounter (Signed)
Patient came in today and and we completed her new patient set up call with [facility], 1st shipment will be delivered to office on 04/08/18. Patient is able to receive shipments to her home, to [facility], or [facility] office.  Patient was also enrolled with Enbrel Support nurse for troubleshooting.  10:59 AM Kathleen Nguyen, CPhT

## 2018-04-08 LAB — CBC WITH DIFFERENTIAL/PLATELET
Basophils Absolute: 21 cells/uL (ref 0–200)
Basophils Relative: 0.5 %
EOS ABS: 8 {cells}/uL — AB (ref 15–500)
Eosinophils Relative: 0.2 %
HCT: 35.3 % (ref 35.0–45.0)
Hemoglobin: 11.6 g/dL — ABNORMAL LOW (ref 11.7–15.5)
Lymphs Abs: 1517 cells/uL (ref 850–3900)
MCH: 27.5 pg (ref 27.0–33.0)
MCHC: 32.9 g/dL (ref 32.0–36.0)
MCV: 83.6 fL (ref 80.0–100.0)
MONOS PCT: 7 %
MPV: 9.9 fL (ref 7.5–12.5)
Neutro Abs: 2267 cells/uL (ref 1500–7800)
Neutrophils Relative %: 55.3 %
PLATELETS: 288 10*3/uL (ref 140–400)
RBC: 4.22 10*6/uL (ref 3.80–5.10)
RDW: 12.8 % (ref 11.0–15.0)
TOTAL LYMPHOCYTE: 37 %
WBC: 4.1 10*3/uL (ref 3.8–10.8)
WBCMIX: 287 {cells}/uL (ref 200–950)

## 2018-04-08 LAB — TEST AUTHORIZATION

## 2018-04-08 LAB — COMPLETE METABOLIC PANEL WITH GFR
AG RATIO: 1.4 (calc) (ref 1.0–2.5)
ALBUMIN MSPROF: 4.4 g/dL (ref 3.6–5.1)
ALKALINE PHOSPHATASE (APISO): 29 U/L — AB (ref 33–115)
ALT: 14 U/L (ref 6–29)
AST: 14 U/L (ref 10–30)
BUN / CREAT RATIO: 19 (calc) (ref 6–22)
BUN: 9 mg/dL (ref 7–25)
CALCIUM: 9 mg/dL (ref 8.6–10.2)
CO2: 25 mmol/L (ref 20–32)
CREATININE: 0.47 mg/dL — AB (ref 0.50–1.10)
Chloride: 105 mmol/L (ref 98–110)
GFR, EST NON AFRICAN AMERICAN: 131 mL/min/{1.73_m2} (ref >=60)
GFR, Est African American: 151 mL/min/{1.73_m2} (ref >=60)
Globulin: 3.1 g/dL (calc) (ref 1.9–3.7)
Glucose, Bld: 91 mg/dL (ref 65–99)
POTASSIUM: 3.9 mmol/L (ref 3.5–5.3)
SODIUM: 137 mmol/L (ref 135–146)
Total Bilirubin: 0.4 mg/dL (ref 0.2–1.2)
Total Protein: 7.5 g/dL (ref 6.1–8.1)

## 2018-04-08 LAB — PHOSPHORUS: Phosphorus: 2.5 mg/dL (ref 2.5–4.5)

## 2018-04-08 NOTE — Telephone Encounter (Signed)
Shipment of Enbrel was received from [facility]. Called patient to advise, she will pick up tomorrow, 04/09/18. Med placed in fridge.  10:10 AM Beatriz Chancellor, CPhT

## 2018-04-09 NOTE — Telephone Encounter (Signed)
  Patient called in to say she will try to pick up Enbrel tomorrow. She also asked to verify directions. Advised patient, she has no other questions at this time.  2:43 PM Beatriz Chancellor, CPhT

## 2018-04-15 ENCOUNTER — Telehealth: Payer: Self-pay | Admitting: Rheumatology

## 2018-04-15 NOTE — Telephone Encounter (Signed)
Genia Plants, clinical educator for the [facility] nurse partner program left a voicemail stating she tried 3 times to reach out to the patient who was sent over to Enbrel support for injection training.  She states she has not been able to get this patient on the phone.  If you have any questions, please call back at [phone]

## 2018-04-20 ENCOUNTER — Telehealth: Payer: Self-pay | Admitting: Pharmacy Technician

## 2018-04-20 NOTE — Telephone Encounter (Signed)
Patient called in to state she had a positive home pregnancy test. She will schedule an appointment with her PCP to confirm. Per Lovena Le, patient was advised to stop Enbrel. She will call office back once her receives confirmation.  3:27 PM Kathleen Nguyen, CPhT

## 2018-04-23 ENCOUNTER — Ambulatory Visit (INDEPENDENT_AMBULATORY_CARE_PROVIDER_SITE_OTHER): Payer: BLUE CROSS/BLUE SHIELD | Admitting: Physician Assistant

## 2018-04-23 ENCOUNTER — Other Ambulatory Visit: Payer: Self-pay

## 2018-04-23 ENCOUNTER — Encounter (INDEPENDENT_AMBULATORY_CARE_PROVIDER_SITE_OTHER): Payer: Self-pay | Admitting: Physician Assistant

## 2018-04-23 ENCOUNTER — Other Ambulatory Visit: Payer: Self-pay | Admitting: Rheumatology

## 2018-04-23 VITALS — BP 95/68 | HR 77 | Temp 98.3°F | Ht 63.0 in | Wt 164.4 lb

## 2018-04-23 DIAGNOSIS — R112 Nausea with vomiting, unspecified: Secondary | ICD-10-CM | POA: Diagnosis not present

## 2018-04-23 DIAGNOSIS — Z3490 Encounter for supervision of normal pregnancy, unspecified, unspecified trimester: Secondary | ICD-10-CM | POA: Diagnosis not present

## 2018-04-23 DIAGNOSIS — R079 Chest pain, unspecified: Secondary | ICD-10-CM

## 2018-04-23 DIAGNOSIS — N912 Amenorrhea, unspecified: Secondary | ICD-10-CM

## 2018-04-23 DIAGNOSIS — M457 Ankylosing spondylitis of lumbosacral region: Secondary | ICD-10-CM

## 2018-04-23 LAB — POCT URINE PREGNANCY: Preg Test, Ur: POSITIVE — AB

## 2018-04-23 MED ORDER — ONDANSETRON HCL 8 MG PO TABS
8.0000 mg | ORAL_TABLET | Freq: Three times a day (TID) | ORAL | 1 refills | Status: DC | PRN
Start: 2018-04-23 — End: 2018-10-26

## 2018-04-23 NOTE — Telephone Encounter (Signed)
Patient has had a positive pregnancy test.

## 2018-04-23 NOTE — Patient Instructions (Signed)

## 2018-04-23 NOTE — Progress Notes (Signed)
Subjective:  Patient ID: Kathleen Nguyen, female    DOB: Aug 23, 1985  Age: 32 y.o. MRN: [id number]  CC: fatigue, nausea, insomnia  HPI Kathleen Nguyen is a 32 y.o. female with a medical history of LBP w/sciatica, spondylosis without myelopathy, ankylosing spondylitis of lumbar region, and PCOS presents with fatigue, nausea, and insomnia. She thought symptoms were attributed to pregnancy and performed an at home pregnancy test which was positive. Has two other children and current symptoms are similar to previous pregnancies. Does not endorse pelvic pain, vaginal bleeding, or fever/chills.      Also complains of chronic waxing and waning left sided chest pain. Onset 4 to 6 years ago. Initially felt chest pain in association with pelvic pain. Feels chest pain is increasing in frequency and felt several times per week. Has noted that activities involving physical exertion bring on her chest pain. Described as a deep stabbing pain. Short lived and does not radiate. Has complained about this pain to several doctors but each thought perhaps her pain was associated with the generalized pains she has felt in her back.      Outpatient Medications Prior to Visit  Medication Sig Dispense Refill  . artificial tears (LACRILUBE) OINT ophthalmic ointment Place into both eyes every 4 (four) hours as needed for dry eyes. 2 Tube 6  . DULoxetine (CYMBALTA) 20 MG capsule Take 1 capsule (20 mg total) by mouth daily. (Patient not taking: Reported on 04/23/2018) 30 capsule 3  . Etanercept (ENBREL MINI) 50 MG/ML SOCT Inject 50 mg into the skin once a week. (Patient not taking: Reported on 04/23/2018) 4 Cartridge 0  . ibuprofen (ADVIL,MOTRIN) 600 MG tablet Take 1 tablet (600 mg total) by mouth every 6 (six) hours as needed. (Patient not taking: Reported on 04/23/2018) 30 tablet 1  . metFORMIN (GLUCOPHAGE) 500 MG tablet Take 2 tablets (1,000 mg total) by mouth 2 (two) times daily with a meal. (Patient not taking: Reported on  04/23/2018) 120 tablet 5   No facility-administered medications prior to visit.      ROS Review of Systems  Constitutional: Positive for malaise/fatigue. Negative for chills and fever.  Eyes: Negative for blurred vision.  Respiratory: Negative for shortness of breath.   Cardiovascular: Positive for chest pain. Negative for palpitations.  Gastrointestinal: Positive for nausea and vomiting. Negative for abdominal pain.  Genitourinary: Negative for dysuria and hematuria.  Musculoskeletal: Negative for joint pain and myalgias.  Skin: Negative for rash.  Neurological: Negative for tingling and headaches.  Psychiatric/Behavioral: Negative for depression. The patient has insomnia. The patient is not nervous/anxious.     Objective:  BP 95/68 (BP Location: Left Arm, Patient Position: Sitting, Cuff Size: Normal)   Pulse 77   Temp 98.3 F (36.8 C) (Oral)   Ht 5\' 3"  (1.6 m)   Wt 164 lb 6.4 oz (74.6 kg)   LMP 03/02/2018   SpO2 97%   BMI 29.12 kg/m   BP/Weight 04/23/2018 03/18/2018 0/62/6948  Systolic BP 95 - 546  Diastolic BP 68 - 71  Wt. (Lbs) 164.4 158 159  BMI 29.12 27.33 28.17      Physical Exam  Constitutional: She is oriented to person, place, and time.  Well developed, well nourished, NAD, polite  HENT:  Head: Normocephalic and atraumatic.  Eyes: No scleral icterus.  Neck: Normal range of motion. Neck supple. No thyromegaly present.  Cardiovascular: Normal rate, regular rhythm, normal heart sounds and intact distal pulses. Exam reveals no gallop and no friction rub.  No  murmur heard. Pulmonary/Chest: Effort normal and breath sounds normal.  Musculoskeletal: She exhibits no edema.  Neurological: She is alert and oriented to person, place, and time.  Skin: Skin is warm and dry. No rash noted. No erythema. No pallor.  Psychiatric: She has a normal mood and affect. Her behavior is normal. Thought content normal.  Vitals reviewed.    Assessment & Plan:   1. Pregnancy,  unspecified gestational age - Beta hCG quant (ref lab) - Ambulatory referral to OB/GYN - PeriNatal Vitamin samples obtained from GYN dept and given to patient.   2. Amenorrhea - POCT urine pregnancy positive in clinic today - Beta hCG quant (ref lab)  3. Nausea and vomiting, intractability of vomiting not specified, unspecified vomiting type - Begin ondansetron (ZOFRAN) 8 MG tablet; Take 1 tablet (8 mg total) by mouth every 8 (eight) hours as needed for nausea or vomiting.  Dispense: 60 tablet; Refill: 1  4. Chest pain, unspecified type - EKG 12-Lead NSR in clinic today. - Ambulatory referral to Cardiology due to chronicity of "4 -6" years.   Meds ordered this encounter  Medications  . ondansetron (ZOFRAN) 8 MG tablet    Sig: Take 1 tablet (8 mg total) by mouth every 8 (eight) hours as needed for nausea or vomiting.    Dispense:  60 tablet    Refill:  1    Order Specific Question:   Supervising Provider    Answer:   Charlott Rakes [[id number]]    Follow-up: 8 weeks for Chest pain  Clent Demark PA

## 2018-04-24 ENCOUNTER — Telehealth (INDEPENDENT_AMBULATORY_CARE_PROVIDER_SITE_OTHER): Payer: Self-pay

## 2018-04-24 LAB — BETA HCG QUANT (REF LAB): HCG QUANT: 31400 m[IU]/mL

## 2018-04-24 NOTE — Telephone Encounter (Signed)
New appeal for Cimzia sent to [facility] with supporting documentation of pregnancy.  Patient has already been instructed to discontinue Enbrel.  Will send document to scan center and update when we receive a response.

## 2018-04-24 NOTE — Telephone Encounter (Signed)
Patient is aware that she is likely 6-[redacted] weeks pregnant. Nat Christen, CMA

## 2018-04-24 NOTE — Telephone Encounter (Signed)
-----   Message from Clent Demark, PA-C sent at 04/24/2018 11:01 AM EDT ----- Pt likely 6-[redacted] weeks pregnant.

## 2018-04-28 NOTE — Telephone Encounter (Signed)
Received a fax from [place] regarding a prior authorization for Cimzia. Authorization has been APPROVED from 03/27/2018 to 04/27/2020.   Will send document to scan center.  Authorization #[id number] Phone # [phone]  Per plan, [facility] must use [facility]  8:17 AM Kathleen Nguyen, CPhT

## 2018-04-29 NOTE — Telephone Encounter (Signed)
Scheduled new start visit for Cimzia on 05/05/18 @ 9am. Will assist patient to call Cimzia to set up copay card during visit.  10:50 AM Beatriz Chancellor, CPhT

## 2018-04-29 NOTE — Telephone Encounter (Signed)
Patient had positive pregnancy test.  Received approval for Cimzia.  All immunosuppressant baseline labs complete.  Last CBC/CMP 10/14 WNL.  Okay to schedule NS visit?

## 2018-04-29 NOTE — Telephone Encounter (Signed)
Patient eligible for Cimzia copay assistance. Will help patient call to enroll at new start visit.  Cimzia# [phone], option 1  8:53 AM Beatriz Chancellor, CPhT

## 2018-04-29 NOTE — Telephone Encounter (Signed)
Called patient to schedule new start visit for Cimzia. Patient was asleep, she will call back later.  10:22 AM Beatriz Chancellor, CPhT

## 2018-04-29 NOTE — Telephone Encounter (Signed)
ok 

## 2018-04-30 NOTE — Progress Notes (Deleted)
Pharmacy Note  Subjective: Patient presents today to the [facility] to see Dr. Estanislado Pandy.   Patient seen by the pharmacist for counseling on Cimzia.  Previous therapy include Enbrel which was discontinued due to pregnancy.  Objective: Quantiferon TB Gold Latest Ref Rng & Units 12/19/2017  Quantiferon TB Gold Plus NEGATIVE NEGATIVE    Hepatitis Latest Ref Rng & Units 12/19/2017  Hep B Surface Ag NON-REACTI NON-REACTIVE  Hep B IgM NON-REACTI NON-REACTIVE  Hep C Ab NON-REACTI NON-REACTIVE  Hep C Ab NON-REACTI NON-REACTIVE    Lab Results  Component Value Date   HIV Non Reactive 12/18/2017    Immunoglobulin Electrophoresis Latest Ref Rng & Units 12/19/2017  IgA  81 - 463 mg/dL 230  IgG 694 - 1,618 mg/dL 1,805(H)  IgM 48 - 271 mg/dL 149    Serum Protein Electrophoresis Latest Ref Rng & Units 04/06/2018  Total Protein 6.1 - 8.1 g/dL 7.5  Albumin 3.8 - 4.8 g/dL -  Alpha-1 0.2 - 0.3 g/dL -  Alpha-2 0.5 - 0.9 g/dL -  Beta Globulin 0.4 - 0.6 g/dL -  Beta 2 0.2 - 0.5 g/dL -  Gamma Globulin 0.8 - 1.7 g/dL -    No results found for: G6PDH  No results found for: TPMT  Chest x-ray: normal 12/19/17  CBC    Component Value Date/Time   WBC 4.1 04/06/2018 1100   RBC 4.22 04/06/2018 1100   HGB 11.6 (L) 04/06/2018 1100   HGB 12.0 09/12/2016 1124   HCT 35.3 04/06/2018 1100   HCT 36.5 09/12/2016 1124   PLT 288 04/06/2018 1100   MCV 83.6 04/06/2018 1100   MCV 84 09/12/2016 1124   MCH 27.5 04/06/2018 1100   MCHC 32.9 04/06/2018 1100   RDW 12.8 04/06/2018 1100   RDW 14.0 09/12/2016 1124   LYMPHSABS 1,517 04/06/2018 1100   LYMPHSABS 1.4 09/12/2016 1124   EOSABS 8 (L) 04/06/2018 1100   EOSABS 0.0 09/12/2016 1124   BASOSABS 21 04/06/2018 1100   BASOSABS 0.0 09/12/2016 1124    Does patient have diagnosis of heart failure?  {yes/no:20286}  Assessment/Plan:  Counseled patient that Cimzia is a TNF blocking agent.  Reviewed Cimzia dose of ***.  Counseled patient on  purpose, proper use, and adverse effects of Cimzia.  Reviewed the most common adverse effects including infections, headache, and injection site reactions. Discussed that there is the possibility of an increased risk of malignancy but it is not well understood if this increased risk is due to the medication or the disease state.  Advised patient to get yearly dermatology exams due to risk of skin cancer.  Reviewed the importance of regular labs while on Cimzia therapy.  Advised patient to get standing labs one month after starting Cimzia.  Provided patient with standing lab orders.  Counseled patient that Cimzia should be held prior to scheduled surgery.  Counseled patient to avoid live vaccines while on Cimzia.  Advised patient to get annual influenza vaccine and the pneumococcal vaccine as indicated.  Provided patient with medication education material and answered all questions.  Patient voiced understanding.  Patient consented to Cimzia.  Will upload consent into the media tab.  Reviewed storage instructions for Cimzia.  Will apply for Cimzia through patient's insurance.  Advised initial injection must be administered in office.    Demonstrated proper injection technique with **** demo pen.  Patient able to demonstrate proper injection technique using the teach back method.  Patient self injected in the **** with:  Sample  Medication: *** NDC: *** Lot: *** Expiration: **  Patient tolerated well.  Observed for 30 mins in office for adverse reaction and ***. Instructed patient to call with any questions/issues.

## 2018-05-01 ENCOUNTER — Telehealth: Payer: Self-pay | Admitting: Pharmacist

## 2018-05-01 ENCOUNTER — Telehealth: Payer: Self-pay | Admitting: Pharmacy Technician

## 2018-05-01 DIAGNOSIS — M457 Ankylosing spondylitis of lumbosacral region: Secondary | ICD-10-CM

## 2018-05-01 MED ORDER — CERTOLIZUMAB PEGOL 2 X 200 MG/ML ~~LOC~~ KIT: 200.0000 mg | PACK | SUBCUTANEOUS | 2 refills | Status: DC

## 2018-05-01 NOTE — Progress Notes (Deleted)
Pharmacy Note  Subjective: Patient presents today to the [facility] to see Dr. Estanislado Pandy.   Patient seen by the pharmacist for counseling on Cimzia for ankylosing spondolyitis. Previously on Enbrel but discontinued due to pregnancy.  Last Enbrel dose was on10/21.  Objective: Quantiferon TB Gold Latest Ref Rng & Units 12/19/2017  Quantiferon TB Gold Plus NEGATIVE NEGATIVE    Hepatitis Latest Ref Rng & Units 12/19/2017  Hep B Surface Ag NON-REACTI NON-REACTIVE  Hep B IgM NON-REACTI NON-REACTIVE  Hep C Ab NON-REACTI NON-REACTIVE  Hep C Ab NON-REACTI NON-REACTIVE    Lab Results  Component Value Date   HIV Non Reactive 12/18/2017    Immunoglobulin Electrophoresis Latest Ref Rng & Units 12/19/2017  IgA  81 - 463 mg/dL 230  IgG 694 - 1,618 mg/dL 1,805(H)  IgM 48 - 271 mg/dL 149    Serum Protein Electrophoresis Latest Ref Rng & Units 04/06/2018  Total Protein 6.1 - 8.1 g/dL 7.5  Albumin 3.8 - 4.8 g/dL -  Alpha-1 0.2 - 0.3 g/dL -  Alpha-2 0.5 - 0.9 g/dL -  Beta Globulin 0.4 - 0.6 g/dL -  Beta 2 0.2 - 0.5 g/dL -  Gamma Globulin 0.8 - 1.7 g/dL -    No results found for: G6PDH  No results found for: TPMT  Chest x-ray: normal on 12/19/17  CBC    Component Value Date/Time   WBC 4.1 04/06/2018 1100   RBC 4.22 04/06/2018 1100   HGB 11.6 (L) 04/06/2018 1100   HGB 12.0 09/12/2016 1124   HCT 35.3 04/06/2018 1100   HCT 36.5 09/12/2016 1124   PLT 288 04/06/2018 1100   MCV 83.6 04/06/2018 1100   MCV 84 09/12/2016 1124   MCH 27.5 04/06/2018 1100   MCHC 32.9 04/06/2018 1100   RDW 12.8 04/06/2018 1100   RDW 14.0 09/12/2016 1124   LYMPHSABS 1,517 04/06/2018 1100   LYMPHSABS 1.4 09/12/2016 1124   EOSABS 8 (L) 04/06/2018 1100   EOSABS 0.0 09/12/2016 1124   BASOSABS 21 04/06/2018 1100   BASOSABS 0.0 09/12/2016 1124    Does patient have diagnosis of heart failure?  {yes/no:20286}  Assessment/Plan:  Counseled patient that Cimzia is a TNF blocking agent.  Reviewed  Cimzia dose of ***.  Counseled patient on purpose, proper use, and adverse effects of Cimzia.  Reviewed the most common adverse effects including infections, headache, and injection site reactions. Discussed that there is the possibility of an increased risk of malignancy but it is not well understood if this increased risk is due to the medication or the disease state.  Advised patient to get yearly dermatology exams due to risk of skin cancer.  Reviewed the importance of regular labs while on Cimzia therapy.  Advised patient to get standing labs one month after starting Cimzia and then every 3 months.  Provided patient with standing lab orders.  Counseled patient that Cimzia should be held prior to scheduled surgery.  Counseled patient to avoid live vaccines while on Cimzia.  Advised patient to get annual influenza vaccine and the pneumococcal vaccine as indicated.  Provided patient with medication education material and answered all questions.  Patient voiced understanding.  Patient consented to Cimzia.  Will upload consent into the media tab.  Reviewed storage instructions for Cimzia.  Will apply for Cimzia through patient's insurance.  Advised initial injection must be administered in office.    Demonstrated proper injection technique with Cimzia demo syringe.  Patient able to demonstrate proper injection technique using the teach  back method.  Patient self injected in the **** with:  Sample Medication: Cimzia 200 mg/ml NDC: ***  Lot: *** Expiration: **  Patient tolerated well.  Observed for 30 mins in office for adverse reaction and ***.   Patient advocate Rachael helped facilitate phone call to set up shipment to patient's home with specialty pharmacy.  Instructed patient to call with any questions/issues.

## 2018-05-01 NOTE — Telephone Encounter (Signed)
Patient called to reschedule [place] Start appointment. Amber Rph rescheduled appointment for 05/04/18 at [facility]. Patient requested a particular interpreter, advised that we have no control of which one they send. It depends on their availability. Patient had no other questions at this time.Marland Kitchen

## 2018-05-04 ENCOUNTER — Ambulatory Visit (INDEPENDENT_AMBULATORY_CARE_PROVIDER_SITE_OTHER): Payer: BLUE CROSS/BLUE SHIELD | Admitting: Pharmacist

## 2018-05-04 DIAGNOSIS — M457 Ankylosing spondylitis of lumbosacral region: Secondary | ICD-10-CM

## 2018-05-04 NOTE — Progress Notes (Signed)
Pharmacy Note  Subjective: Patient presents today to the [facility] to discuss starting Cimzia.  Patient approved for Cimzia through prescription insurance but Dr. Estanislado Pandy requested in office injection for ease of patient and improve adherence since patient had difficulty with Enbrel injection in the past.  Objective:  Quantiferon TB Gold Latest Ref Rng & Units 12/19/2017  Quantiferon TB Gold Plus NEGATIVE NEGATIVE    Hepatitis Latest Ref Rng & Units 12/19/2017  Hep B Surface Ag NON-REACTI NON-REACTIVE  Hep B IgM NON-REACTI NON-REACTIVE  Hep C Ab NON-REACTI NON-REACTIVE  Hep C Ab NON-REACTI NON-REACTIVE    Lab Results  Component Value Date   HIV Non Reactive 12/18/2017    Immunoglobulin Electrophoresis Latest Ref Rng & Units 12/19/2017  IgA  81 - 463 mg/dL 230  IgG 694 - 1,618 mg/dL 1,805(H)  IgM 48 - 271 mg/dL 149    Serum Protein Electrophoresis Latest Ref Rng & Units 04/06/2018  Total Protein 6.1 - 8.1 g/dL 7.5  Albumin 3.8 - 4.8 g/dL -  Alpha-1 0.2 - 0.3 g/dL -  Alpha-2 0.5 - 0.9 g/dL -  Beta Globulin 0.4 - 0.6 g/dL -  Beta 2 0.2 - 0.5 g/dL -  Gamma Globulin 0.8 - 1.7 g/dL -    No results found for: G6PDH  No results found for: TPMT Chest x-ray: normal on 12/19/17  CBC    Component Value Date/Time   WBC 4.1 04/06/2018 1100   RBC 4.22 04/06/2018 1100   HGB 11.6 (L) 04/06/2018 1100   HGB 12.0 09/12/2016 1124   HCT 35.3 04/06/2018 1100   HCT 36.5 09/12/2016 1124   PLT 288 04/06/2018 1100   MCV 83.6 04/06/2018 1100   MCV 84 09/12/2016 1124   MCH 27.5 04/06/2018 1100   MCHC 32.9 04/06/2018 1100   RDW 12.8 04/06/2018 1100   RDW 14.0 09/12/2016 1124   LYMPHSABS 1,517 04/06/2018 1100   LYMPHSABS 1.4 09/12/2016 1124   EOSABS 8 (L) 04/06/2018 1100   EOSABS 0.0 09/12/2016 1124   BASOSABS 21 04/06/2018 1100   BASOSABS 0.0 09/12/2016 1124    Does patient have diagnosis of heart failure?  No  Assessment/Plan:  Discussed in office injection vs  at home injections.  Patient is okay to proceed with application process for in office injections.  Application started and will update patient when we receive a response. Patient states she can only come to the office on Monday and Friday.  If approved in a timely manner will schedule first dose visit on Friday.  Patient was previously counseled on Cimzia and had no additional questions at this time.  Patient was consented to [place] in August but is not reflected in [place].  Will need to obtain new consent form at next visit.  All questions encouraged and answered.  Mariella Saa, PharmD, [facility] Rheumatology Clinical Pharmacist  05/04/2018 9:41 AM

## 2018-05-04 NOTE — Telephone Encounter (Signed)
error 

## 2018-05-04 NOTE — Telephone Encounter (Signed)
Submitted patient info to [employer] for [employer] Investigation. Awaiting response.

## 2018-05-05 ENCOUNTER — Ambulatory Visit: Payer: BLUE CROSS/BLUE SHIELD

## 2018-05-07 ENCOUNTER — Telehealth: Payer: Self-pay | Admitting: Pharmacy Technician

## 2018-05-07 NOTE — Telephone Encounter (Signed)
Spoke to patient and scheduled Cimzia new start visit for Monday 05/11/18 at 8:15am.   Can you please update appointment time to ensure interpreter shows up on time. I am unable to see any appointments before 9am.  Thanks!  12:35 PM Beatriz Chancellor, CPhT

## 2018-05-07 NOTE — Telephone Encounter (Signed)
Left message for patient to call back to schedule Cimzia New Start visit. Called pharmacy to remove medication from hold.  10:35 AM Beatriz Chancellor, CPhT

## 2018-05-11 ENCOUNTER — Ambulatory Visit: Payer: BLUE CROSS/BLUE SHIELD

## 2018-05-11 ENCOUNTER — Ambulatory Visit (INDEPENDENT_AMBULATORY_CARE_PROVIDER_SITE_OTHER): Payer: BLUE CROSS/BLUE SHIELD | Admitting: Pharmacist

## 2018-05-11 VITALS — BP 102/71 | HR 95

## 2018-05-11 DIAGNOSIS — M457 Ankylosing spondylitis of lumbosacral region: Secondary | ICD-10-CM

## 2018-05-11 MED ORDER — CERTOLIZUMAB PEGOL 2 X 200 MG/ML ~~LOC~~ KIT
400.0000 mg | PACK | Freq: Once | SUBCUTANEOUS | Status: AC
  Administered 2018-05-11: 400 mg via SUBCUTANEOUS

## 2018-05-11 NOTE — Progress Notes (Signed)
Office Visit Note  Patient: Kathleen Nguyen             Date of Birth: [birth year]           MRN: [id number]             PCP: Clent Demark, PA-C Referring: Clent Demark, PA-C Visit Date: 05/25/2018 Occupation: @GUAROCC @  Subjective:  Medication management.   History of Present Illness: Kathleen Nguyen is a 32 y.o. female with  history of ankylosing spondylitis.  Patient was a started on Cimzia on May 11, 2018.  She was on Enbrel prior to that and Enbrel was discontinued due to pregnancy.  She states she has generally well on Cimzia.  She denies any joint pain or joint swelling.  She denies any discomfort in the SI joint region.  She is currently at 9 weeks of gestation and has been doing well.  Activities of Daily Living:  Patient reports morning stiffness for 0 minutes.   Patient Denies nocturnal pain.  Difficulty dressing/grooming: Denies Difficulty climbing stairs: Denies Difficulty getting out of chair: Denies Difficulty using hands for taps, buttons, cutlery, and/or writing: Denies  Review of Systems  Constitutional: Negative for fatigue.  HENT: Positive for mouth dryness. Negative for mouth sores, nosebleeds, trouble swallowing and trouble swallowing.   Eyes: Negative for pain, redness, itching and dryness.  Respiratory: Negative for shortness of breath, wheezing and difficulty breathing.   Cardiovascular: Negative for chest pain, palpitations and swelling in legs/feet.  Gastrointestinal: Negative for abdominal pain, blood in stool, constipation and diarrhea.  Endocrine: Positive for increased urination.  Genitourinary: Negative for painful urination, nocturia and pelvic pain.  Musculoskeletal: Negative for arthralgias, joint pain, joint swelling and morning stiffness.  Skin: Negative for rash and hair loss.  Allergic/Immunologic: Negative for susceptible to infections.  Neurological: Negative for dizziness, light-headedness, headaches, memory loss and  weakness.  Hematological: Negative for bruising/bleeding tendency.  Psychiatric/Behavioral: Negative for confusion.    PMFS History:  Patient Active Problem List   Diagnosis Date Noted  . Supervision of other normal pregnancy, antepartum 05/18/2018  . Uterine adenomyoma 12/11/2016  . Tinea pedis of left foot 12/05/2016  . Pelvic pain 09/23/2016    Past Medical History:  Diagnosis Date  . Rheumatoid arthritis ([place])     History reviewed. No pertinent family history. Past Surgical History:  Procedure Laterality Date  . CESAREAN SECTION    . CESAREAN SECTION     Social History   Social History Narrative  . Not on file    Objective: Vital Signs: BP 100/68 (BP Location: Left Arm, Patient Position: Sitting, Cuff Size: Normal)   Pulse 90   Resp 13   Ht 5\' 3"  (1.6 m)   Wt 164 lb 6.4 oz (74.6 kg)   LMP 03/02/2018   BMI 29.12 kg/m    Physical Exam  Constitutional: She is oriented to person, place, and time. She appears well-developed and well-nourished.  HENT:  Head: Normocephalic and atraumatic.  Eyes: Conjunctivae and EOM are normal.  Neck: Normal range of motion.  Cardiovascular: Normal rate, regular rhythm, normal heart sounds and intact distal pulses.  Pulmonary/Chest: Effort normal and breath sounds normal.  Abdominal: Soft. Bowel sounds are normal.  Lymphadenopathy:    She has no cervical adenopathy.  Neurological: She is alert and oriented to person, place, and time.  Skin: Skin is warm and dry. Capillary refill takes less than 2 seconds.  Psychiatric: She has a normal mood and affect. Her behavior is  normal.  Nursing note and vitals reviewed.    Musculoskeletal Exam: C-spine thoracic lumbar spine good range of motion.  Shoulder joints elbow joints wrist joint MCPs PIPs DIPs been good range of motion with no synovitis.  She had no SI joint tenderness.  Hip joints knee joints ankles MTPs PIPs been good range of motion with no synovitis.  CDAI Exam: CDAI Score:  Not documented Patient Global Assessment: Not documented; Provider Global Assessment: Not documented Swollen: Not documented; Tender: Not documented Joint Exam   Not documented   There is currently no information documented on the homunculus. Go to the Rheumatology activity and complete the homunculus joint exam.  Investigation: No additional findings.  Imaging: US Ob Less Than 14 Weeks With Ob Transvaginal  Result Date: 05/18/2018 CLINICAL DATA:  32 year old pregnant female with reported size-date discrepancy. EDC by LMP: 12/07/2018, projecting to an expected gestational age of [redacted] weeks 0 days. EXAM: OBSTETRIC <14 WK [place] AND TRANSVAGINAL OB US TECHNIQUE: Both transabdominal and transvaginal ultrasound examinations were performed for complete evaluation of the gestation as well as the maternal uterus, adnexal regions, and pelvic cul-de-sac. Transvaginal technique was performed to assess early pregnancy. COMPARISON:  10/08/2016 pelvic sonogram. No prior scans from this gestation. FINDINGS: Intrauterine gestational sac: Single intrauterine gestational sac appears slightly irregular in contour and normal in position and size. Yolk sac:  Visualized. Embryo:  Visualized. Embryonic Cardiac Activity: Not Visualized. CRL:  3.4 mm   5 w   6 d                  [place] EDC: 01/12/2019 Subchorionic hemorrhage:  None visualized. Maternal uterus/adnexae: Anteverted uterus. Cesarean scar is seen in the anterior lower uterine segment. Myometrial thickening and heterogeneity in the right upper uterine body, previously characterized as focal segmental adenomyosis on 12/10/2016 MRI pelvis. Right ovary measures 2.5 x 1.7 x 2.7 cm. Left ovary not visualized. No abnormal adnexal masses. No abnormal free fluid the pelvis. IMPRESSION: 1. Single intrauterine gestation at 5 weeks 6 days by crown-rump length. Slightly irregular gestational sac contour. No embryonic cardiac activity detected. Findings are suspicious but not yet  definitive for failed pregnancy. Recommend follow-up US in 11-14 days for definitive diagnosis. This recommendation follows SRU consensus guidelines: Diagnostic Criteria for Nonviable Pregnancy Early in the First Trimester. [employer] Med 2013; 833:8250-53. 2. No abnormal adnexal masses. 3. Chronic myometrial thickening and heterogeneity in the right upper uterine body, favored to represent focal segmental adenomyosis on 12/10/2016 MRI pelvis study. These results will be called to the ordering clinician or representative by the Radiology Department at the imaging location. Electronically Signed   By: Ilona Sorrel M.D.   On: 05/18/2018 15:32    Recent Labs: Lab Results  Component Value Date   WBC 4.1 04/06/2018   HGB 11.6 (L) 04/06/2018   PLT 288 04/06/2018   NA 137 04/06/2018   K 3.9 04/06/2018   CL 105 04/06/2018   CO2 25 04/06/2018   GLUCOSE 91 04/06/2018   BUN 9 04/06/2018   CREATININE 0.47 (L) 04/06/2018   BILITOT 0.4 04/06/2018   ALKPHOS 37 (L) 09/12/2016   AST 14 04/06/2018   ALT 14 04/06/2018   PROT 7.5 04/06/2018   ALBUMIN 4.8 09/12/2016   CALCIUM 9.0 04/06/2018   GFRAA 151 04/06/2018   QFTBGOLDPLUS NEGATIVE 12/19/2017    Speciality Comments: No specialty comments available.  Procedures:  No procedures performed Allergies: Patient has no known allergies.   Assessment / Plan:  Visit Diagnoses: Ankylosing spondylitis of lumbosacral region (HCC)-patient is clinically doing much better on Cimzia.  She has no joint pain or joint swelling.  Her sacroiliitis has resolved.  High risk medication use - Current regimen includes Cimzia 200 mg subq every 14 days started on 05/11/18.  Prior therapy includes Enbrel but discontinued due to pregnancy. Last TB gold negative on 12/19/17.  Most recent CBC/CMP showed borderline low hemoglobin on 04/06/18.  Next CBC/CMP due in December and then every 3 months. Standing orders in place.  Recommend flu and Pneumovax 23.  Recommend yearly skin  exams.  Sacroiliitis (HCC)-currently not active.  Dry eyes-her symptoms have improved.  [redacted] weeks gestation of pregnancy-followed up by GYN.  Language barrier   Orders: No orders of the defined types were placed in this encounter.  No orders of the defined types were placed in this encounter.    Follow-Up Instructions: Return in about 3 months (around 08/24/2018) for Ankylosing spondylitis.   Bo Merino, MD  Note - This record has been created using Editor, commissioning.  Chart creation errors have been sought, but may not always  have been located. Such creation errors do not reflect on  the standard of medical care.

## 2018-05-11 NOTE — Telephone Encounter (Signed)
At [facility] new start visit today, Called Cimzia support with patient to set up copay card. Card will be generated and sent to patient in 3 business days. Patient will call office once she receives and will stop by to set up Pharmacy shipment and provide copay card info to pharmacy.  9:36 AM Beatriz Chancellor, CPhT

## 2018-05-11 NOTE — Progress Notes (Signed)
Pharmacy Note  Subjective: Patient presents today to the [facility] to see Dr. Estanislado Pandy.   Patient seen by the pharmacist for counseling on Cimzia for ankylosing spondylitis. Prior therapy included Enbrel but was discontinued due to pregnancy. Last dose of Enbrel on 10/30.   Objective: Quantiferon TB Gold Latest Ref Rng & Units 12/19/2017  Quantiferon TB Gold Plus NEGATIVE NEGATIVE    Hepatitis Latest Ref Rng & Units 12/19/2017  Hep B Surface Ag NON-REACTI NON-REACTIVE  Hep B IgM NON-REACTI NON-REACTIVE  Hep C Ab NON-REACTI NON-REACTIVE  Hep C Ab NON-REACTI NON-REACTIVE    Lab Results  Component Value Date   HIV Non Reactive 12/18/2017    Immunoglobulin Electrophoresis Latest Ref Rng & Units 12/19/2017  IgA  81 - 463 mg/dL 230  IgG 694 - 1,618 mg/dL 1,805(H)  IgM 48 - 271 mg/dL 149    Serum Protein Electrophoresis Latest Ref Rng & Units 04/06/2018  Total Protein 6.1 - 8.1 g/dL 7.5  Albumin 3.8 - 4.8 g/dL -  Alpha-1 0.2 - 0.3 g/dL -  Alpha-2 0.5 - 0.9 g/dL -  Beta Globulin 0.4 - 0.6 g/dL -  Beta 2 0.2 - 0.5 g/dL -  Gamma Globulin 0.8 - 1.7 g/dL -    No results found for: G6PDH  No results found for: TPMT  Chest x-ray: normal on 12/19/17  CBC    Component Value Date/Time   WBC 4.1 04/06/2018 1100   RBC 4.22 04/06/2018 1100   HGB 11.6 (L) 04/06/2018 1100   HGB 12.0 09/12/2016 1124   HCT 35.3 04/06/2018 1100   HCT 36.5 09/12/2016 1124   PLT 288 04/06/2018 1100   MCV 83.6 04/06/2018 1100   MCV 84 09/12/2016 1124   MCH 27.5 04/06/2018 1100   MCHC 32.9 04/06/2018 1100   RDW 12.8 04/06/2018 1100   RDW 14.0 09/12/2016 1124   LYMPHSABS 1,517 04/06/2018 1100   LYMPHSABS 1.4 09/12/2016 1124   EOSABS 8 (L) 04/06/2018 1100   EOSABS 0.0 09/12/2016 1124   BASOSABS 21 04/06/2018 1100   BASOSABS 0.0 09/12/2016 1124    Does patient have diagnosis of heart failure?  Yes  Assessment/Plan:  Counseled patient that Cimzia is a TNF blocking agent.  Reviewed  Cimzia dose of 200 mg/ml every 14 days.  Counseled patient on purpose, proper use, and adverse effects of Cimzia.  Reviewed the most common adverse effects including infections, headache, and injection site reactions. Discussed that there is the possibility of an increased risk of malignancy but it is not well understood if this increased risk is due to the medication or the disease state.  Advised patient to get yearly dermatology exams due to risk of skin cancer.  Reviewed the importance of regular labs while on Cimzia therapy.  Advised patient to get standing labs one month after starting Cimzia.  Provided patient with standing lab orders.  Counseled patient that Cimzia should be held prior to scheduled surgery.  Counseled patient to avoid live vaccines while on Cimzia.  Advised patient to get annual influenza vaccine and the pneumococcal vaccine as indicated.  Provided patient with medication education material and answered all questions.  Patient voiced understanding.  Patient consented to Cimzia.  Will upload consent into the media tab.  Reviewed storage instructions for Cimzia.  Advised initial injection must be administered in office.   Patient dose will be 200 mg/ml every 14 days.  Loading dose not administered due to prior therapy with Enbrel.  Demonstrated proper injection technique with  Cimzia demo syringe.  Patient able to demonstrate proper injection technique using the teach back method. Patient self injected in the left upper thigh with 1syringe:  Sample Medication: Cimzia 200mg /ml x 2 syringes NDC: [id number] Lot: [id number] Expiration: 11/31/19  Second syringe wasted in sharps container as it would expire prior to next dose.  Patient advocate, Rachael, assisted with setting up co-pay card and working with  [facility].  Pharmacy can not set up shipment until she receives co-pay card information.  She was instructed to call Rachael for further assistance.   Instructed patient  to return for labs in 1 month and then every 3 months.  Encouraged patient to obtain flu shot especially since she is now pregnant.  Patient tolerated well.  Observed for 30 mins in office for adverse reaction and none noted.  All questions encouraged and answered. Instructed patient to call with any questions/issues.    Mariella Saa, PharmD, [facility] Rheumatology Clinical Pharmacist  05/11/2018 10:17 AM

## 2018-05-11 NOTE — Patient Instructions (Addendum)
Standing Labs We placed an order today for your standing lab work.    Please come back and get your standing labs in 1 month and then every 3 months.  We have open lab Monday through Friday from 8:30-11:30 AM and 1:30-4:00 PM  at the office of Dr. Bo Merino.    You may experience shorter wait times on Monday and Friday afternoons. The office is located at [address], [place], [place], [place] [address] No appointment is necessary.   Labs are drawn by [facility].  You may receive a bill from [facility] for your lab work. If you have any questions regarding directions or hours of operation,  please call [phone].   Just as a reminder please drink plenty of water prior to coming for your lab work. Thanks!  Helpful Tips for Injecting To help alleviate pain and injection site reactions consider the following tips:  . Placing something cold (like and ice gel pack or cold water bottle) on the injection site just before cleansing with alcohol may help reduce pain . If you have a localized reaction (redness, mild swelling, warmth, and itching) you can use topical corticosteroids (hydrocortisone cream) or antihistamine (Claritin) to help minimize reaction the day of, the day before, and the day after injecting . Always inject this medication at room temperature (remove from refrigerator 15-20 minutes before injecting; this may help eliminate stinging). . Always rotate your injection sites using inside/outside thigh of both legs, abdomen divided into 4 quadrants (stay away from waist line, and 2" away from navel). . Do not inject into areas where skin is tender, bruised, red, or hard or where there are scars or stretch marks. . Always let alcohol dry on the skin before injecting. . Place a cold, damp towel or small ice pack on the injection site for 10 or 15 minutes every 1 to 2 hours if it hurts or is swollen.

## 2018-05-12 ENCOUNTER — Encounter (INDEPENDENT_AMBULATORY_CARE_PROVIDER_SITE_OTHER): Payer: Self-pay | Admitting: Specialist

## 2018-05-13 NOTE — Telephone Encounter (Signed)
Left message for patient, to call back. She left message that she has received her Cimzia copay card. Now, we need to call pharmacy and give them that info and set up 1st shipment.

## 2018-05-14 NOTE — Telephone Encounter (Signed)
Patient's plan does not support Cimzia buy and bill, will proceed with pharmacy benefit.

## 2018-05-14 NOTE — Telephone Encounter (Signed)
Made conference call with [facility] to [facility], scheduled 1st shipment for Cimzia. Delivery will come to patient's home on 05/19/2018.  Patient has a zero copay at this time. Patient will bring copay card to next visit and I will call to give [facility] to [facility].  10:52 AM Beatriz Chancellor, CPhT

## 2018-05-18 ENCOUNTER — Other Ambulatory Visit: Payer: Self-pay | Admitting: Advanced Practice Midwife

## 2018-05-18 ENCOUNTER — Ambulatory Visit (INDEPENDENT_AMBULATORY_CARE_PROVIDER_SITE_OTHER): Payer: BLUE CROSS/BLUE SHIELD | Admitting: Advanced Practice Midwife

## 2018-05-18 ENCOUNTER — Encounter: Payer: Self-pay | Admitting: Advanced Practice Midwife

## 2018-05-18 ENCOUNTER — Ambulatory Visit (HOSPITAL_COMMUNITY)
Admission: RE | Admit: 2018-05-18 | Discharge: 2018-05-18 | Disposition: A | Discharge status: Home / Self Care | Payer: BLUE CROSS/BLUE SHIELD | Source: Ambulatory Visit | Attending: Advanced Practice Midwife | Admitting: Advanced Practice Midwife

## 2018-05-18 VITALS — BP 99/66 | HR 88 | Wt 163.2 lb

## 2018-05-18 DIAGNOSIS — O219 Vomiting of pregnancy, unspecified: Secondary | ICD-10-CM | POA: Diagnosis not present

## 2018-05-18 DIAGNOSIS — O26841 Uterine size-date discrepancy, first trimester: Secondary | ICD-10-CM | POA: Insufficient documentation

## 2018-05-18 DIAGNOSIS — O283 Abnormal ultrasonic finding on antenatal screening of mother: Secondary | ICD-10-CM

## 2018-05-18 DIAGNOSIS — Z3481 Encounter for supervision of other normal pregnancy, first trimester: Secondary | ICD-10-CM

## 2018-05-18 DIAGNOSIS — Z3A01 Less than 8 weeks gestation of pregnancy: Secondary | ICD-10-CM | POA: Insufficient documentation

## 2018-05-18 DIAGNOSIS — Z3480 Encounter for supervision of other normal pregnancy, unspecified trimester: Secondary | ICD-10-CM | POA: Insufficient documentation

## 2018-05-18 NOTE — Progress Notes (Signed)
   PRENATAL VISIT NOTE  Subjective:  Kathleen Nguyen is a 32 y.o. G3P2002 at [redacted]w[redacted]d by sure LMP being seen today for initial prenatal visit.  She is currently monitored for the following issues for this low-risk pregnancy and has Pelvic pain; Tinea pedis of left foot; Uterine adenomyoma; and Supervision of other normal pregnancy, antepartum on their problem list.  Patient reports nausea.  Contractions: Not present. Vag. Bleeding: None.   . Denies leaking of fluid.   The following portions of the patient's history were reviewed and updated as appropriate: allergies, current medications, past family history, past medical history, past social history, past surgical history and problem list. Problem list updated.  Objective:   Vitals:   05/18/18 0949  BP: 99/66  Pulse: 88  Weight: 74 kg    Fetal Status:           General:  Alert, oriented and cooperative. Patient is in no acute distress.  Skin: Skin is warm and dry. No rash noted.   Cardiovascular: Normal heart rate noted  Respiratory: Normal respiratory effort, no problems with respiration noted  Abdomen: Soft, gravid, appropriate for gestational age.  Pain/Pressure: Absent     Pelvic: Cervical exam deferred        Extremities: Normal range of motion.  Edema: None  Mental Status: Normal mood and affect. Normal behavior. Normal judgment and thought content.   Assessment and Plan:  Pregnancy: G3P2002 at [redacted]w[redacted]d  1. Supervision of other normal pregnancy, antepartum --Labwork/Pap not done today, FHT not heard with doppler and bedside US from abdomen with size significantly less than dates.  Gestational sac vs tiny fetal pole visualized with no visible heart rate.  Difficult to measure for dates. --F/U with formal US at [facility], will reschedule new OB if normal with earlier dates.  2. Abnormal pregnancy [place]  - US OB LESS THAN 14 WEEKS WITH OB TRANSVAGINAL; Future  3. Uterine size date discrepancy pregnancy, first trimester - US OB LESS  THAN 14 WEEKS WITH OB TRANSVAGINAL; Future  4. N/v of pregnancy --Managing well, occasionally takes meds prescribed by primary care.  Preterm labor symptoms and general obstetric precautions including but not limited to vaginal bleeding, contractions, leaking of fluid and fetal movement were reviewed in detail with the patient. Please refer to After Visit Summary for other counseling recommendations.  No follow-ups on file.  Future Appointments  Date Time Provider [place]  05/25/2018  9:40 AM Ofilia Neas, PA-C [facility] None    Fatima Blank, CNM

## 2018-05-18 NOTE — Progress Notes (Signed)
Outpatient [place] ordered in 11-14 days. [facility] staff to schedule follow up ultrasound in 2 weeks with follow up in the office for results and call pt to let her know.

## 2018-05-18 NOTE — Addendum Note (Signed)
Addended by: Tristan Schroeder D on: 05/18/2018 03:37 PM   Modules accepted: Orders

## 2018-05-25 ENCOUNTER — Encounter: Payer: Self-pay | Admitting: Physician Assistant

## 2018-05-25 ENCOUNTER — Ambulatory Visit (INDEPENDENT_AMBULATORY_CARE_PROVIDER_SITE_OTHER): Payer: BLUE CROSS/BLUE SHIELD | Admitting: Rheumatology

## 2018-05-25 VITALS — BP 100/68 | HR 90 | Resp 13 | Ht 63.0 in | Wt 164.4 lb

## 2018-05-25 DIAGNOSIS — Z789 Other specified health status: Secondary | ICD-10-CM

## 2018-05-25 DIAGNOSIS — M457 Ankylosing spondylitis of lumbosacral region: Secondary | ICD-10-CM

## 2018-05-25 DIAGNOSIS — Z79899 Other long term (current) drug therapy: Secondary | ICD-10-CM

## 2018-05-25 DIAGNOSIS — H04123 Dry eye syndrome of bilateral lacrimal glands: Secondary | ICD-10-CM

## 2018-05-25 DIAGNOSIS — M461 Sacroiliitis, not elsewhere classified: Secondary | ICD-10-CM

## 2018-05-25 DIAGNOSIS — Z3A09 9 weeks gestation of pregnancy: Secondary | ICD-10-CM

## 2018-05-25 NOTE — Telephone Encounter (Signed)
Patient came in for follow up visit and stated she did not receive shipment. Patient was to sit in the lobby to follow up with me. Went to the lobby, and patient was gone.   [facility], they stated insurance now shows a copay for patient, so they were waiting for copay card. Rep stated that sometimes their test claim system does not give accurate copay information. Patient now has $125 copay.   Called patient, left message. Patient received a sample for her injection today. Will need patient to bring copay card to office, so we can schedule shipment.  10:02 AM Beatriz Chancellor, CPhT

## 2018-05-25 NOTE — Patient Instructions (Signed)
Standing Labs We placed an order today for your standing lab work.    Please come back and get your standing labs in with next injection and then every 3 months  We have open lab Monday through Friday from 8:30-11:30 AM and 1:30-4:00 PM  at the office of Dr. Bo Merino.   You may experience shorter wait times on Monday and Friday afternoons. The office is located at [address] [place], [place], [place] [address] No appointment is necessary.   Labs are drawn by [facility].  You may receive a bill from [facility] for your lab work. If you have any questions regarding directions or hours of operation,  please call [phone].   Just as a reminder please drink plenty of water prior to coming for your lab work. Thanks!

## 2018-05-26 NOTE — Telephone Encounter (Signed)
Spoke to patient, she will plan to come by office to bring the copay card information and to reschedule the shipment

## 2018-05-28 ENCOUNTER — Encounter (HOSPITAL_COMMUNITY): Payer: Self-pay

## 2018-05-28 ENCOUNTER — Inpatient Hospital Stay (HOSPITAL_COMMUNITY): Payer: BLUE CROSS/BLUE SHIELD

## 2018-05-28 ENCOUNTER — Inpatient Hospital Stay (HOSPITAL_COMMUNITY)
Admission: AD | Admit: 2018-05-28 | Discharge: 2018-05-29 | Disposition: A | Discharge status: Home / Self Care | Payer: BLUE CROSS/BLUE SHIELD | Attending: Obstetrics & Gynecology | Admitting: Obstetrics & Gynecology

## 2018-05-28 ENCOUNTER — Other Ambulatory Visit: Payer: Self-pay

## 2018-05-28 DIAGNOSIS — O209 Hemorrhage in early pregnancy, unspecified: Secondary | ICD-10-CM | POA: Diagnosis present

## 2018-05-28 DIAGNOSIS — O021 Missed abortion: Secondary | ICD-10-CM | POA: Diagnosis not present

## 2018-05-28 LAB — URINALYSIS, ROUTINE W REFLEX MICROSCOPIC
Bilirubin Urine: NEGATIVE
GLUCOSE, UA: NEGATIVE mg/dL
Ketones, ur: NEGATIVE mg/dL
Leukocytes, UA: NEGATIVE
Nitrite: NEGATIVE
PH: 8 (ref 5.0–8.0)
Protein, ur: NEGATIVE mg/dL
SPECIFIC GRAVITY, URINE: 1.017 (ref 1.005–1.030)

## 2018-05-28 NOTE — MAU Note (Signed)
Pt sates that she is having a little bit of bleeding when she wipes. She states it is spotting.   Pt states white discharge

## 2018-05-28 NOTE — MAU Provider Note (Signed)
Chief Complaint: No chief complaint on file.   First Provider Initiated Contact with Patient 05/28/18 2126        SUBJECTIVE HPI: Kathleen Nguyen is a 32 y.o. G3P2002 at 34w3dby LMP who presents to maternity admissions reporting small amount of vaginal bleeding when she wipes.  Not requiring pad between times.  Has some cramping from time to time.  Very desired pregnancy.. She denies vaginal itching/burning, urinary symptoms, h/a, dizziness, n/v, or fever/chills.    Vaginal Bleeding  The patient's primary symptoms include pelvic pain, vaginal bleeding and vaginal discharge. The patient's pertinent negatives include no genital itching, genital lesions or genital odor. This is a new problem. The current episode started yesterday. The problem occurs intermittently. The problem has been unchanged. The pain is mild. The problem affects both sides. She is pregnant. Associated symptoms include abdominal pain (very mild cramps). Pertinent negatives include no constipation, diarrhea, dysuria, fever, nausea or vomiting. The vaginal discharge was bloody. The vaginal bleeding is spotting. She has not been passing clots. She has not been passing tissue. Nothing aggravates the symptoms. She has tried nothing for the symptoms.   RN Note: Pt sates that she is having a little bit of bleeding when she wipes. She states it is spotting.  Pt states white discharge   Past Medical History:  Diagnosis Date  . Rheumatoid arthritis ([facility]    Past Surgical History:  Procedure Laterality Date  . CESAREAN SECTION    . CESAREAN SECTION     Social History   Socioeconomic History  . Marital status: Married    Spouse name: Not on file  . Number of children: Not on file  . Years of education: Not on file  . Highest education level: Not on file  Occupational History  . Not on file  Social Needs  . Financial resource strain: Not on file  . Food insecurity:    Worry: Not on file    Inability: Not on file  .  Transportation needs:    Medical: Not on file    Non-medical: Not on file  Tobacco Use  . Smoking status: Never Smoker  . Smokeless tobacco: Never Used  Substance and Sexual Activity  . Alcohol use: No    Frequency: Never  . Drug use: No  . Sexual activity: Yes  Lifestyle  . Physical activity:    Days per week: Not on file    Minutes per session: Not on file  . Stress: Not on file  Relationships  . Social connections:    Talks on phone: Not on file    Gets together: Not on file    Attends religious service: Not on file    Active member of club or organization: Not on file    Attends meetings of clubs or organizations: Not on file    Relationship status: Not on file  . Intimate partner violence:    Fear of current or ex partner: Not on file    Emotionally abused: Not on file    Physically abused: Not on file    Forced sexual activity: Not on file  Other Topics Concern  . Not on file  Social History Narrative  . Not on file   No current facility-administered medications on file prior to encounter.    Current Outpatient Medications on File Prior to Encounter  Medication Sig Dispense Refill  . artificial tears (LACRILUBE) OINT ophthalmic ointment Place into both eyes every 4 (four) hours as needed for dry eyes. (  Patient not taking: Reported on 05/18/2018) 2 Tube 6  . Certolizumab Pegol 2 X 200 MG/ML KIT Inject 200 mg into the skin every 14 (fourteen) days. 1 kit 2  . ondansetron (ZOFRAN) 8 MG tablet Take 1 tablet (8 mg total) by mouth every 8 (eight) hours as needed for nausea or vomiting. (Patient not taking: Reported on 05/18/2018) 60 tablet 1   No Known Allergies  I have reviewed patient's Past Medical Hx, Surgical Hx, Family Hx, Social Hx, medications and allergies.   ROS:  Review of Systems  Constitutional: Negative for fever.  Gastrointestinal: Positive for abdominal pain (very mild cramps). Negative for constipation, diarrhea, nausea and vomiting.  Genitourinary:  Positive for pelvic pain, vaginal bleeding and vaginal discharge. Negative for dysuria.   Review of Systems  Other systems negative   Physical Exam  Physical Exam Patient Vitals for the past 24 hrs:  Weight  05/28/18 2123 75 kg   Constitutional: Well-developed, well-nourished female in no acute distress.  Cardiovascular: normal rate Respiratory: normal effort GI: Abd soft, non-tender. Pos BS x 4 MS: Extremities nontender, no edema, normal ROM Neurologic: Alert and oriented x 4.  GU: Neg CVAT.  PELVIC EXAM: Cervix pink, visually closed, without lesion, scant pink discharge, vaginal walls and external genitalia normal   LAB RESULTS Results for orders placed or performed during the hospital encounter of 05/28/18 (from the past 24 hour(s))  Urinalysis, Routine w reflex microscopic     Status: Abnormal   Collection Time: 05/28/18  9:28 PM  Result Value Ref Range   Color, Urine YELLOW YELLOW   APPearance HAZY (A) CLEAR   Specific Gravity, Urine 1.017 1.005 - 1.030   pH 8.0 5.0 - 8.0   Glucose, UA NEGATIVE NEGATIVE mg/dL   Hgb urine dipstick LARGE (A) NEGATIVE   Bilirubin Urine NEGATIVE NEGATIVE   Ketones, ur NEGATIVE NEGATIVE mg/dL   Protein, ur NEGATIVE NEGATIVE mg/dL   Nitrite NEGATIVE NEGATIVE   Leukocytes, UA NEGATIVE NEGATIVE   RBC / HPF 6-10 0 - 5 RBC/hpf   WBC, UA 0-5 0 - 5 WBC/hpf   Bacteria, UA RARE (A) NONE SEEN   Squamous Epithelial / LPF 6-10 0 - 5   Mucus PRESENT   ABO/Rh     Status: None   Collection Time: 05/28/18 11:30 PM  Result Value Ref Range   ABO/RH(D)      O POS Performed at [facility], [address] [place], [place] [address]        IMAGING US Ob Transvaginal  Result Date: 05/28/2018 CLINICAL DATA:  Bleeding.  Follow-up from 05/18/2018 EXAM: TRANSVAGINAL OB ULTRASOUND TECHNIQUE: Transvaginal ultrasound was performed for complete evaluation of the gestation as well as the maternal uterus, adnexal regions, and pelvic cul-de-sac.  COMPARISON:  05/18/2018 FINDINGS: Intrauterine gestational sac: Single Yolk sac:  Not well visualized Embryo:  Visualized Cardiac Activity: Not visualized Heart Rate:  bpm MSD:   mm    w     d CRL:   5.1 mm   6 w 1 d                  [place] EDC: 01/23/2019 Subchorionic hemorrhage:  None visualized. Maternal uterus/adnexae: No adnexal mass or free fluid. IMPRESSION: Single intrauterine gestation, 6 weeks 1 day by crown-rump length. No cardiac activity detected, and little change since prior ultrasound performed 10 days ago. Findings meet definitive criteria for failed pregnancy. This follows SRU consensus guidelines: Diagnostic Criteria for Nonviable Pregnancy Early in the  First Trimester. Alison Stalling J Med [phone]. Electronically Signed   By: Rolm Baptise M.D.   On: 05/28/2018 23:00   US Ob Less Than 14 Weeks With Ob Transvaginal  Result Date: 05/18/2018 CLINICAL DATA:  32 year old pregnant female with reported size-date discrepancy. EDC by LMP: 12/07/2018, projecting to an expected gestational age of [redacted] weeks 0 days. EXAM: OBSTETRIC <14 WK [place] AND TRANSVAGINAL OB US TECHNIQUE: Both transabdominal and transvaginal ultrasound examinations were performed for complete evaluation of the gestation as well as the maternal uterus, adnexal regions, and pelvic cul-de-sac. Transvaginal technique was performed to assess early pregnancy. COMPARISON:  10/08/2016 pelvic sonogram. No prior scans from this gestation. FINDINGS: Intrauterine gestational sac: Single intrauterine gestational sac appears slightly irregular in contour and normal in position and size. Yolk sac:  Visualized. Embryo:  Visualized. Embryonic Cardiac Activity: Not Visualized. CRL:  3.4 mm   5 w   6 d                  [place] EDC: 01/12/2019 Subchorionic hemorrhage:  None visualized. Maternal uterus/adnexae: Anteverted uterus. Cesarean scar is seen in the anterior lower uterine segment. Myometrial thickening and heterogeneity in the right upper uterine body,  previously characterized as focal segmental adenomyosis on 12/10/2016 MRI pelvis. Right ovary measures 2.5 x 1.7 x 2.7 cm. Left ovary not visualized. No abnormal adnexal masses. No abnormal free fluid the pelvis. IMPRESSION: 1. Single intrauterine gestation at 5 weeks 6 days by crown-rump length. Slightly irregular gestational sac contour. No embryonic cardiac activity detected. Findings are suspicious but not yet definitive for failed pregnancy. Recommend follow-up US in 11-14 days for definitive diagnosis. This recommendation follows SRU consensus guidelines: Diagnostic Criteria for Nonviable Pregnancy Early in the First Trimester. [employer] Med 2013; [id number]. 2. No abnormal adnexal masses. 3. Chronic myometrial thickening and heterogeneity in the right upper uterine body, favored to represent focal segmental adenomyosis on 12/10/2016 MRI pelvis study. These results will be called to the ordering clinician or representative by the Radiology Department at the imaging location. Electronically Signed   By: Ilona Sorrel M.D.   On: 05/18/2018 15:32     MAU Management/MDM: Ordered Ultrasound to rule out SAB or other cause of bleeding Ordered ABO/Rh since there is not one on file  >  O+ so no need for Rhophylac Ultrasound findings reviewed with patient who is grieving appropriately Discussed options at this point which include expectant management, cytotec induction or D&C.  Explained surgery is least recommended at this early stage of pregnancy unless she really wants it. She has discussed it with her husband via phone and decided on Cytotec management Early Intrauterine Pregnancy Failure Protocol  X Documented intrauterine pregnancy failure less than or equal to [redacted] weeks gestation  X No serious current illness  X Baseline Hgb greater than or equal to 10g/dl  X Patient has easily accessible transportation to the hospital  X Clear preference  X Practitioner/physician deems patient reliable  X  Counseling by practitioner or physician  X Patient education by RN  Rho-Gam given by RN if indicated, N/A  X  Cytotec 800 mcg Buccally by patient at home  X  Rx for Ibuprofen 600 mg 1 tablet by mouth every 6 hours as needed #30 - prescribed  X  Rx Percocet #15tabs for prn use for pain  X   Rx Phenergan by mouth every 6 hours as needed  _x_ Phenergan 25 mg by mouth every 4 hours as needed for nausea -  prescribed   Discussed bleeding precautions and may return to MAU as needed.   Message sent to clinic for followup in 2 weeks  ASSESSMENT Single intrauterine pregnancy at 53w2dwith fetal demise, missed abortion  PLAN Discharge home Cytotec protocol Bleeding precautions Return to clinic in 2 weeks or as needed for followup Comfort given, miscarriage pack given  Pt stable at time of discharge. Encouraged to return here or to other Urgent Care/ED if she develops worsening of symptoms, increase in pain, fever, or other concerning symptoms.    MHansel FeinsteinCNM, MSN Certified Nurse-Midwife 05/28/2018  9:26 PM

## 2018-05-29 ENCOUNTER — Encounter: Payer: Self-pay | Admitting: Advanced Practice Midwife

## 2018-05-29 DIAGNOSIS — O021 Missed abortion: Secondary | ICD-10-CM

## 2018-05-29 DIAGNOSIS — O209 Hemorrhage in early pregnancy, unspecified: Secondary | ICD-10-CM

## 2018-05-29 LAB — ABO/RH: ABO/RH(D): O POS

## 2018-05-29 MED ORDER — OXYCODONE-ACETAMINOPHEN 5-325 MG PO TABS: 1.0000 | ORAL_TABLET | Freq: Four times a day (QID) | ORAL | 0 refills | Status: DC | PRN

## 2018-05-29 MED ORDER — IBUPROFEN 600 MG PO TABS: 600.0000 mg | ORAL_TABLET | Freq: Four times a day (QID) | ORAL | 1 refills | Status: DC | PRN

## 2018-05-29 MED ORDER — PROMETHAZINE HCL 25 MG PO TABS: 25.0000 mg | ORAL_TABLET | Freq: Four times a day (QID) | ORAL | 2 refills | Status: DC | PRN

## 2018-05-29 MED ORDER — MISOPROSTOL 200 MCG PO TABS: ORAL_TABLET | ORAL | 1 refills | Status: DC

## 2018-05-29 NOTE — Discharge Instructions (Signed)
Pelvic Rest Pelvic rest may be recommended if:  Your placenta is partially or completely covering the opening of your cervix (placenta previa).  There is bleeding between the wall of the uterus and the amniotic sac in the first trimester of pregnancy (subchorionic hemorrhage).  You went into labor too early (preterm labor).  Based on your overall health and the health of your baby, your health care provider will decide if pelvic rest is right for you. How do I rest my pelvis? For as long as told by your health care provider:  Do not have sex, sexual stimulation, or an orgasm.  Do not use tampons. Do not douche. Do not put anything in your vagina.  Do not lift anything that is heavier than 10 lb (4.5 kg).  Avoid activities that take a lot of effort (are strenuous).  Avoid any activity in which your pelvic muscles could become strained.  When should I seek medical care? Seek medical care if you have:  Cramping pain in your lower abdomen.  Vaginal discharge.  A low, dull backache.  Regular contractions.  Uterine tightening.  When should I seek immediate medical care? Seek immediate medical care if:  You have vaginal bleeding and you are pregnant.  This information is not intended to replace advice given to you by your health care provider. Make sure you discuss any questions you have with your health care provider. Document Released: 10/05/2010 Document Revised: 11/16/2015 Document Reviewed: 12/12/2014 Elsevier Interactive Patient Education  2018 [employer]. Incomplete Miscarriage A miscarriage is the sudden loss of an unborn baby (fetus) before the 20th week of pregnancy. In an incomplete miscarriage, parts of the fetus or placenta (afterbirth) remain in the body. Having a miscarriage can be an emotional experience. Talk with your health care provider about any questions you may have about miscarrying, the grieving process, and your future pregnancy plans. What are  the causes?  Problems with the fetal chromosomes that make it impossible for the baby to develop normally. Problems with the baby's genes or chromosomes are most often the result of errors that occur by chance as the embryo divides and grows. The problems are not inherited from the parents.  Infection of the cervix or uterus.  Hormone problems.  Problems with the cervix, such as having an incompetent cervix. This is when the tissue in the cervix is not strong enough to hold the pregnancy.  Problems with the uterus, such as an abnormally shaped uterus, uterine fibroids, or congenital abnormalities.  Certain medical conditions.  Smoking, drinking alcohol, or taking illegal drugs.  Trauma. What are the signs or symptoms?  Vaginal bleeding or spotting, with or without cramps or pain.  Pain or cramping in the abdomen or lower back.  Passing fluid, tissue, or blood clots from the vagina. How is this diagnosed? Your health care provider will perform a physical exam. You may also have an ultrasound to confirm the miscarriage. Blood or urine tests may also be ordered. How is this treated?  Usually, a dilation and curettage (D&C) procedure is performed. During a D&C procedure, the cervix is widened (dilated) and any remaining fetal or placental tissue is gently removed from the uterus.  Antibiotic medicines are prescribed if there is an infection. Other medicines may be given to reduce the size of the uterus (contract) if there is a lot of bleeding.  If you have Rh negative blood and your baby was Rh positive, you will need a Rho (D) immune globulin shot.  This shot will protect any future baby from having Rh blood problems in future pregnancies.  You may be confined to bed rest. This means you should stay in bed and only get up to use the bathroom. Follow these instructions at home:  Rest as directed by your health care provider.  Restrict activity as directed by your health care  provider. You may be allowed to continue light activity if curettage was not done but you require further treatment.  Keep track of the number of pads you use each day. Keep track of how soaked (saturated) they are. Record this information.  Do not  use tampons.  Do not douche or have sexual intercourse until approved by your health care provider.  Keep all follow-up appointments for reevaluation and continuing management.  Only take over-the-counter or prescription medicines for pain, fever, or discomfort as directed by your health care provider.  Take antibiotic medicine as directed by your health care provider. Make sure you finish it even if you start to feel better. Get help right away if:  You experience severe cramps in your stomach, back, or abdomen.  You have an unexplained temperature (make sure to record these temperatures).  You pass large clots or tissue (save these for your health care provider to inspect).  Your bleeding increases.  You become light-headed, weak, or have fainting episodes. This information is not intended to replace advice given to you by your health care provider. Make sure you discuss any questions you have with your health care provider. Document Released: 06/10/2005 Document Revised: 11/16/2015 Document Reviewed: 01/07/2013 Elsevier Interactive Patient Education  2017 [employer].

## 2018-06-01 ENCOUNTER — Telehealth: Payer: Self-pay | Admitting: Advanced Practice Midwife

## 2018-06-01 ENCOUNTER — Encounter: Payer: BLUE CROSS/BLUE SHIELD | Admitting: Advanced Practice Midwife

## 2018-06-01 ENCOUNTER — Ambulatory Visit (HOSPITAL_COMMUNITY): Payer: BLUE CROSS/BLUE SHIELD

## 2018-06-01 NOTE — Telephone Encounter (Signed)
Pt called today to speak with me with questions about her pregnancy/miscarriage.  She was seen in the office for New OB visit initially and FHT were not found by doppler or seen on my bedside US.  Formal US outpatient showed [redacted]w[redacted]d IUP without FHR and follow up US on 12/5 showed no FHR and no growth so definitive for missed ab.  Cytotec was prescribed on 12/5 with plan for follow up at [facility] in 2 weeks.    Called pt today using language line for arabic interpreter.  Message left with interpreter for pt to return call to [facility] or to go to MAU with any heavy bleeding or pain.

## 2018-06-02 ENCOUNTER — Emergency Department (HOSPITAL_COMMUNITY)
Admission: EM | Admit: 2018-06-02 | Discharge: 2018-06-03 | Disposition: A | Discharge status: Home / Self Care | Payer: BLUE CROSS/BLUE SHIELD | Attending: Emergency Medicine | Admitting: Emergency Medicine

## 2018-06-02 ENCOUNTER — Encounter (HOSPITAL_COMMUNITY): Payer: Self-pay | Admitting: Emergency Medicine

## 2018-06-02 ENCOUNTER — Emergency Department (HOSPITAL_COMMUNITY): Payer: BLUE CROSS/BLUE SHIELD

## 2018-06-02 ENCOUNTER — Other Ambulatory Visit: Payer: Self-pay

## 2018-06-02 DIAGNOSIS — O039 Complete or unspecified spontaneous abortion without complication: Secondary | ICD-10-CM | POA: Diagnosis not present

## 2018-06-02 DIAGNOSIS — O26891 Other specified pregnancy related conditions, first trimester: Secondary | ICD-10-CM | POA: Diagnosis not present

## 2018-06-02 DIAGNOSIS — O034 Incomplete spontaneous abortion without complication: Secondary | ICD-10-CM | POA: Diagnosis not present

## 2018-06-02 DIAGNOSIS — R109 Unspecified abdominal pain: Secondary | ICD-10-CM | POA: Diagnosis not present

## 2018-06-02 DIAGNOSIS — O021 Missed abortion: Secondary | ICD-10-CM | POA: Diagnosis not present

## 2018-06-02 DIAGNOSIS — R1030 Lower abdominal pain, unspecified: Secondary | ICD-10-CM | POA: Diagnosis not present

## 2018-06-02 DIAGNOSIS — O9989 Other specified diseases and conditions complicating pregnancy, childbirth and the puerperium: Secondary | ICD-10-CM | POA: Diagnosis present

## 2018-06-02 LAB — CBC
HCT: 31.2 % — ABNORMAL LOW (ref 36.0–46.0)
Hemoglobin: 10.2 g/dL — ABNORMAL LOW (ref 12.0–15.0)
MCH: 28.2 pg (ref 26.0–34.0)
MCHC: 32.7 g/dL (ref 30.0–36.0)
MCV: 86.2 fL (ref 80.0–100.0)
PLATELETS: 252 10*3/uL (ref 150–400)
RBC: 3.62 MIL/uL — AB (ref 3.87–5.11)
RDW: 12.7 % (ref 11.5–15.5)
WBC: 5.9 10*3/uL (ref 4.0–10.5)
nRBC: 0 % (ref 0.0–0.2)

## 2018-06-02 LAB — BASIC METABOLIC PANEL
Anion gap: 10 (ref 5–15)
BUN: 7 mg/dL (ref 6–20)
CO2: 23 mmol/L (ref 22–32)
Calcium: 9.3 mg/dL (ref 8.9–10.3)
Chloride: 104 mmol/L (ref 98–111)
Creatinine, Ser: 0.48 mg/dL (ref 0.44–1.00)
GFR calc Af Amer: >60 mL/min (ref >60)
Glucose, Bld: 98 mg/dL (ref 70–99)
Potassium: 3.2 mmol/L — ABNORMAL LOW (ref 3.5–5.1)
SODIUM: 137 mmol/L (ref 135–145)

## 2018-06-02 MED ORDER — KETOROLAC TROMETHAMINE 15 MG/ML IJ SOLN
15.0000 mg | Freq: Once | INTRAMUSCULAR | Status: AC
  Administered 2018-06-02: 15 mg via INTRAVENOUS
  Filled 2018-06-02: qty 1

## 2018-06-02 MED ORDER — SODIUM CHLORIDE 0.9 % IV BOLUS
1000.0000 mL | Freq: Once | INTRAVENOUS | Status: AC
  Administered 2018-06-02: 1000 mL via INTRAVENOUS

## 2018-06-02 MED ORDER — ONDANSETRON HCL 4 MG/2ML IJ SOLN
4.0000 mg | Freq: Once | INTRAMUSCULAR | Status: AC
  Administered 2018-06-02: 4 mg via INTRAVENOUS
  Filled 2018-06-02: qty 2

## 2018-06-02 MED ORDER — HYDROMORPHONE HCL 1 MG/ML IJ SOLN
0.5000 mg | Freq: Once | INTRAMUSCULAR | Status: AC
  Administered 2018-06-02: 0.5 mg via INTRAVENOUS
  Filled 2018-06-02: qty 1

## 2018-06-02 MED ORDER — OXYCODONE-ACETAMINOPHEN 5-325 MG PO TABS
1.0000 | ORAL_TABLET | ORAL | Status: AC | PRN
  Administered 2018-06-02 (×2): 1 via ORAL
  Filled 2018-06-02 (×2): qty 1

## 2018-06-02 NOTE — ED Notes (Signed)
Pt reports worsening abd pain from a threatened miscarriage. Pt reports this all started 5 days ago and she has been taking all the prescribed medications that [facility] hospital had given her.

## 2018-06-02 NOTE — ED Provider Notes (Signed)
[facility] EMERGENCY DEPARTMENT Provider Note   CSN: [id number] Arrival date & time: 06/02/18  1946     History   Chief Complaint Chief Complaint  Patient presents with  . Threatened Miscarriage    HPI Kathleen Nguyen is a 32 y.o. female.  Patient with history of rheumatoid arthritis, known fibroids -- presents the emergency department with ongoing miscarriage.  History obtained through the medical record and by the patient.   Patient had ultrasound on 05/18/2018 which was concerning for failed pregnancy.  This was confirmed on 05/28/2018 in the MAU.  Patient was prescribed Cytotec, Phenergan, Percocet.  She has been taking these medications.  The day after starting this medication she had a lot of bleeding and was passing clots the size of her hand.  Bleeding has slowed and is now minimal and intermittent.  She presents today with unbearable lower abdominal pain.  Pain does not radiate.  She has not had any improvement today despite her medications, including Percocet.  She denies any syncope but states that she has been lightheaded with standing.  No chest pain or shortness of breath.  No urinary symptoms.  Onset of symptoms acute.  Course is worsening.  Nothing makes symptoms better.  Past Medical History:  Diagnosis Date  . Rheumatoid arthritis [facility])     Patient Active Problem List   Diagnosis Date Noted  . Supervision of other normal pregnancy, antepartum 05/18/2018  . Uterine adenomyoma 12/11/2016  . Tinea pedis of left foot 12/05/2016  . Pelvic pain 09/23/2016    Past Surgical History:  Procedure Laterality Date  . CESAREAN SECTION    . CESAREAN SECTION       OB History    Gravida  3   Para  2   Term  2   Preterm      AB      Living  2     SAB      TAB      Ectopic      Multiple      Live Births  2            Home Medications    Prior to Admission medications   Medication Sig Start Date End Date Taking? Authorizing  Provider  artificial tears (LACRILUBE) OINT ophthalmic ointment Place into both eyes every 4 (four) hours as needed for dry eyes. Patient not taking: Reported on 05/18/2018 03/18/18   Jessy Oto, MD  Certolizumab Pegol 2 X 200 MG/ML KIT Inject 200 mg into the skin every 14 (fourteen) days. 05/01/18   Bo Merino, MD  ibuprofen (ADVIL,MOTRIN) 600 MG tablet Take 1 tablet (600 mg total) by mouth every 6 (six) hours as needed. 05/29/18   Seabron Spates, CNM  misoprostol (CYTOTEC) 200 MCG tablet Place four tablets in between your gums and cheeks (two tablets on each side) as instructed 05/29/18   Seabron Spates, CNM  ondansetron (ZOFRAN) 8 MG tablet Take 1 tablet (8 mg total) by mouth every 8 (eight) hours as needed for nausea or vomiting. Patient not taking: Reported on 05/18/2018 04/23/18   Clent Demark, PA-C  oxyCODONE-acetaminophen (PERCOCET/ROXICET) 5-325 MG tablet Take 1-2 tablets by mouth every 6 (six) hours as needed for moderate pain. 05/29/18   Seabron Spates, CNM  promethazine (PHENERGAN) 25 MG tablet Take 1 tablet (25 mg total) by mouth every 6 (six) hours as needed for nausea or vomiting. 05/29/18   Seabron Spates, CNM  Family History No family history on file.  Social History Social History   Tobacco Use  . Smoking status: Never Smoker  . Smokeless tobacco: Never Used  Substance Use Topics  . Alcohol use: No    Frequency: Never  . Drug use: No     Allergies   Patient has no known allergies.   Review of Systems Review of Systems  Constitutional: Negative for fever.  HENT: Negative for rhinorrhea and sore throat.   Eyes: Negative for redness.  Respiratory: Negative for cough and shortness of breath.   Cardiovascular: Negative for chest pain.  Gastrointestinal: Positive for nausea. Negative for abdominal pain, diarrhea and vomiting.  Genitourinary: Positive for pelvic pain and vaginal bleeding. Negative for dysuria.  Musculoskeletal: Negative for  myalgias.  Skin: Negative for rash.  Neurological: Negative for headaches.     Physical Exam Updated Vital Signs BP (!) 135/98   Pulse 90   Temp 97.7 F (36.5 C) (Oral)   Resp (!) 24   Ht 5' 4"  (1.626 m)   Wt 74.8 kg   LMP 03/02/2018 Comment: recent miscarriage 05/28/2018  SpO2 100%   Breastfeeding? Unknown   BMI 28.32 kg/m   Physical Exam  Constitutional: She appears well-developed and well-nourished. She appears distressed (Uncomfortable appearing).  HENT:  Head: Normocephalic and atraumatic.  Eyes: Conjunctivae are normal. Right eye exhibits no discharge. Left eye exhibits no discharge.  Neck: Normal range of motion. Neck supple.  Cardiovascular: Normal rate, regular rhythm and normal heart sounds.  No murmur heard. Pulmonary/Chest: Effort normal and breath sounds normal. No respiratory distress. She has no wheezes.  Abdominal: Soft. There is tenderness in the suprapubic area. There is no rebound, no guarding and no CVA tenderness.  Neurological: She is alert.  Skin: Skin is warm and dry.  Psychiatric: She has a normal mood and affect.  Nursing note and vitals reviewed.    ED Treatments / Results  Labs (all labs ordered are listed, but only abnormal results are displayed) Labs Reviewed  CBC - Abnormal; Notable for the following components:      Result Value   RBC 3.62 (*)    Hemoglobin 10.2 (*)    HCT 31.2 (*)    All other components within normal limits  BASIC METABOLIC PANEL - Abnormal; Notable for the following components:   Potassium 3.2 (*)    All other components within normal limits    EKG None  Radiology No results found.  Procedures Procedures (including critical care time)  Medications Ordered in ED Medications  oxyCODONE-acetaminophen (PERCOCET/ROXICET) 5-325 MG per tablet 1 tablet (1 tablet Oral Given 06/02/18 2111)  sodium chloride 0.9 % bolus 1,000 mL (1,000 mLs Intravenous New Bag/Given 06/02/18 2118)  HYDROmorphone (DILAUDID)  injection 0.5 mg (0.5 mg Intravenous Given 06/02/18 2118)  ondansetron (ZOFRAN) injection 4 mg (4 mg Intravenous Given 06/02/18 2118)  ketorolac (TORADOL) 15 MG/ML injection 15 mg (15 mg Intravenous Given 06/02/18 2118)     Initial Impression / Assessment and Plan / ED Course  I have reviewed the triage vital signs and the nursing notes.  Pertinent labs & imaging results that were available during my care of the patient were reviewed by me and considered in my medical decision making (see chart for details).     Patient seen and examined.  Discussed case with Dr. Sedonia Small. Will give fluids, check blood counts, treat pain. Her recent clinical course is consistent with ongoing miscarriage.  Vital signs are within normal limits.  Patient  does not appear pale.  Symptom control indicated at this time.  Vital signs reviewed and are as follows: BP (!) 135/98   Pulse 90   Temp 97.7 F (36.5 C) (Oral)   Resp (!) 24   Ht 5' 4"  (1.626 m)   Wt 74.8 kg   LMP 03/02/2018 Comment: recent miscarriage 05/28/2018  SpO2 100%   Breastfeeding? Unknown   BMI 28.32 kg/m    11:05 PM I went and spoke with patient using interpreter.  Her pain is better controlled now.  She is concerned about her symptoms tonight and would like reassurance regarding completion of her miscarriage.  I feel this is appropriate given that her bleeding is improved however her pain was worse today.  Transvaginal ultrasound ordered and will ensure no signs of retained products.  11:32 PM Handoff to McDonald PA-C at shift change pending ultrasound.   Final Clinical Impressions(s) / ED Diagnoses   Final diagnoses:  Miscarriage   Suspect completed miscarriage, pain controlled in ED. Pending [place] to ensure no retained products.   ED Discharge Orders    None       Carlisle Cater, Hershal Coria 06/02/18 2333    Maudie Flakes, MD 06/03/18 [phone]

## 2018-06-02 NOTE — ED Provider Notes (Signed)
"  Patient with history of rheumatoid arthritis, known fibroids -- presents the emergency department with ongoing miscarriage.  History obtained through the medical record and by the patient.   Patient had ultrasound on 05/18/2018 which was concerning for failed pregnancy.  This was confirmed on 05/28/2018 in the MAU.  Patient was prescribed Cytotec, Phenergan, Percocet.  She has been taking these medications.  The day after starting this medication she had a lot of bleeding and was passing clots the size of her hand.  Bleeding has slowed and is now minimal and intermittent.  She presents today with unbearable lower abdominal pain.  Pain does not radiate.  She has not had any improvement today despite her medications, including Percocet.  She denies any syncope but states that she has been lightheaded with standing.  No chest pain or shortness of breath.  No urinary symptoms.  Onset of symptoms acute.  Course is worsening.  Nothing makes symptoms better."  Physical Exam  BP 108/73   Pulse 82   Temp 97.7 F (36.5 C) (Oral)   Resp (!) 24   Ht 5\' 4"  (1.626 m)   Wt 74.8 kg   LMP 03/02/2018 Comment: recent miscarriage 05/28/2018  SpO2 100%   Breastfeeding? Unknown   BMI 28.32 kg/m   Physical Exam  NAD. Resting comfortably in bed.   ED Course/Procedures     Procedures  MDM  32 year old female received a signout from [facility] pending pelvic ultrasound.  Pelvic ultrasound demonstrating gestational sac located in the endocervical region and probable small amount of blood product surrounding the gestational sac.  The patient was discussed with Dr. Kathrynn Humble, attending physician.  Consulted OB/GYN and spoke with Dr. Mancel Bale who recommended discharging the patient to home with 800 mcg of misoprostol and follow-up in the OB/GYN clinic.  Discussed these findings with the patient and her husband using an Arabic interpreter.  They are agreeable with the plan at this time.  We will also discharge her home  with antiemetic and pain control. A 13-month prescription history query was performed using the [place] CSRS prior to discharge.   Discussed with the patient that if her symptoms worsen after taking Cytotec that she should follow-up at [facility].  Return precautions given.  She is hemodynamically stable and in no acute distress.  She is safe for discharge home with outpatient follow-up at this time.        Joline Maxcy A, PA-C 06/03/18 1275    Varney Biles, MD 06/04/18 0400

## 2018-06-02 NOTE — Discharge Instructions (Addendum)
?????   4 ????? ?? ????????????? ?? ???? ???? ????? ?????.  ??? ?? ?????? ???? ???? ?? ??? ?? ???? ??? ?????? ?????? ?? ?????? 5.  ????? ?? ???? 1 ??? ?? ??????? ?? 6 ????? ??? ?????? ?????? ?? ???.  ????? ????? ??? ?????? ? ????? ?? ???? 600 ??? ?? ?????????? ?? ?????? ?? 650 ??? ?? ???????? ?? 6  1 ??? ?? ??????? ?? 6 ????? ??? ??????.  ?? ??? ???????? ?? ????? ????? ????? ??? ?????? ???? ???? ?? ????? ??????.  ???? ??????? ????? ?????? ???????? ?????? ???? ???????? ?? ??? ???? ?? ??? ???????.  ?????? ??? ?????? ?????? ??? ???? ??????? ?????? ?? ?????? ??? ?? ??? ??? ??? ??? ??? ? ?????? ??? ????? ? ????? ?? ????? ?? ???? ??????? ????? ? ?????? ??????? ??? ????? ?? ??? ???????? ? ?? ??????? ?????? ? ???? ????? ????????.  Take 4 tablets of misoprostol by mouth for 1 dose.  This is the medication that you were given after you went to [facility] on December 5.  You can take 1 tablet of Phenergan every 6 hours as needed for nausea or vomiting.  For mild to moderate pain, you can take 600 mg of ibuprofen with food or 650 mg of Tylenol every 6 hours.  For severe pain, take 1 tablet of Percocet every 6 hours as needed.  Do not drive or work while taking this medication because it can make you drowsy.  Please call your OB/GYN's office and schedule follow-up appointment for later this week.  Go to [facility] if your symptoms worsen including if you pass out, develop a high fever, have uncontrollable abdominal pain, persistent vomiting despite taking Phenergan, or other new, concerning symptoms.  Please read and follow all provided instructions.  Your diagnoses today include:  1. Miscarriage     Tests performed today include: Blood counts and electrolytes Ultrasound Vital signs. See below for your results today.   Home care instructions:  Continue using   Follow-up instructions: Please follow-up with your primary care provider in the next 3 days for further evaluation of your symptoms.     Return instructions:  Please return to the Emergency Department if you experience worsening symptoms.  Please return if you have any other emergent concerns.  Additional Information:  Your vital signs today were: BP 108/73    Pulse 82    Temp 97.7 F (36.5 C) (Oral)    Resp (!) 24    Ht 5\' 4"  (1.626 m)    Wt 74.8 kg    LMP 03/02/2018 Comment: recent miscarriage 05/28/2018   SpO2 100%    Breastfeeding? Unknown    BMI 28.32 kg/m  If your blood pressure (BP) was elevated above 135/85 this visit, please have this repeated by your doctor within one month. --------------

## 2018-06-02 NOTE — ED Triage Notes (Signed)
Pt reports confirmed miscarriage 5 days with intense pain that has gotten worse today. Pt reports moderate vaginal bleeding. Pt has been taking the prescribed pain medicine with minimal relief [facility] tearful in triage. Recently took 600mg  ibuprofen at 1800 and percocet at 4pm.

## 2018-06-03 ENCOUNTER — Inpatient Hospital Stay (HOSPITAL_COMMUNITY): Payer: BLUE CROSS/BLUE SHIELD

## 2018-06-03 ENCOUNTER — Emergency Department (HOSPITAL_COMMUNITY): Payer: BLUE CROSS/BLUE SHIELD

## 2018-06-03 ENCOUNTER — Encounter (HOSPITAL_COMMUNITY): Payer: Self-pay

## 2018-06-03 ENCOUNTER — Inpatient Hospital Stay (EMERGENCY_DEPARTMENT_HOSPITAL)
Admission: AD | Admit: 2018-06-03 | Discharge: 2018-06-04 | Disposition: A | Discharge status: Home / Self Care | Payer: BLUE CROSS/BLUE SHIELD | Source: Home / Self Care | Attending: Obstetrics and Gynecology | Admitting: Obstetrics and Gynecology

## 2018-06-03 DIAGNOSIS — O034 Incomplete spontaneous abortion without complication: Secondary | ICD-10-CM | POA: Insufficient documentation

## 2018-06-03 DIAGNOSIS — O021 Missed abortion: Secondary | ICD-10-CM

## 2018-06-03 DIAGNOSIS — R109 Unspecified abdominal pain: Secondary | ICD-10-CM

## 2018-06-03 DIAGNOSIS — Z3A01 Less than 8 weeks gestation of pregnancy: Secondary | ICD-10-CM | POA: Insufficient documentation

## 2018-06-03 DIAGNOSIS — O26891 Other specified pregnancy related conditions, first trimester: Secondary | ICD-10-CM

## 2018-06-03 LAB — CBC
HCT: 30.7 % — ABNORMAL LOW (ref 36.0–46.0)
Hemoglobin: 9.9 g/dL — ABNORMAL LOW (ref 12.0–15.0)
MCH: 28.2 pg (ref 26.0–34.0)
MCHC: 32.2 g/dL (ref 30.0–36.0)
MCV: 87.5 fL (ref 80.0–100.0)
Platelets: 245 10*3/uL (ref 150–400)
RBC: 3.51 MIL/uL — AB (ref 3.87–5.11)
RDW: 13.2 % (ref 11.5–15.5)
WBC: 5.4 10*3/uL (ref 4.0–10.5)
nRBC: 0 % (ref 0.0–0.2)

## 2018-06-03 MED ORDER — OXYCODONE-ACETAMINOPHEN 5-325 MG PO TABS: 1.0000 | ORAL_TABLET | Freq: Four times a day (QID) | ORAL | 0 refills | Status: DC | PRN

## 2018-06-03 MED ORDER — MISOPROSTOL 200 MCG PO TABS: 800.0000 ug | ORAL_TABLET | Freq: Once | ORAL | 0 refills | Status: DC

## 2018-06-03 MED ORDER — MORPHINE SULFATE (PF) 4 MG/ML IV SOLN
4.0000 mg | Freq: Once | INTRAVENOUS | Status: AC
  Administered 2018-06-03: 4 mg via INTRAMUSCULAR

## 2018-06-03 MED ORDER — HYDROMORPHONE HCL 1 MG/ML IJ SOLN: 0.5000 mg | Freq: Once | INTRAMUSCULAR | Status: DC

## 2018-06-03 MED ORDER — HYDROMORPHONE HCL 1 MG/ML IJ SOLN
0.5000 mg | Freq: Once | INTRAMUSCULAR | Status: AC
  Administered 2018-06-03: 0.5 mg via INTRAMUSCULAR
  Filled 2018-06-03: qty 1

## 2018-06-03 MED ORDER — PROMETHAZINE HCL 25 MG PO TABS: 25.0000 mg | ORAL_TABLET | Freq: Four times a day (QID) | ORAL | 0 refills | Status: DC | PRN

## 2018-06-03 MED ORDER — IBUPROFEN 600 MG PO TABS
600.0000 mg | ORAL_TABLET | Freq: Four times a day (QID) | ORAL | Status: DC | PRN
  Administered 2018-06-03: 600 mg via ORAL
  Filled 2018-06-03: qty 1

## 2018-06-03 MED ORDER — MORPHINE SULFATE (PF) 4 MG/ML IV SOLN
INTRAVENOUS | Status: AC
  Administered 2018-06-03: 4 mg via INTRAMUSCULAR
  Filled 2018-06-03: qty 1

## 2018-06-03 NOTE — MAU Note (Signed)
Had an incomplete SAB 5 days ago, was sent home with cytotec and oxycodone . Today pain is worse. She took oxycodone and ibuprofen today with no relief. Pt describes the pain as cramping/colic/burning. Pain is across whole lower abdomen but worse on right lower   Having some bleeding with a few blood clots  No vomiting, having some nausea

## 2018-06-03 NOTE — MAU Provider Note (Addendum)
History     CSN: [id number]  Arrival date and time: 06/03/18 1818   Chief Complaint  Patient presents with  . Abdominal Pain  . Vaginal Bleeding   G3P2002 with known failed pregnancy @7  wks here with worsening pain. She was initially seen 6 days ago and given Cytotec and did not pass the pregnancy. She represented to the ED yesterday for worsening pain, IUGS was seen in the endocervix, and she was given a second dose of Cytotec which she took today at noon. Reports pain and bleeding started around 5pm. Rates pain 10/10. She took Percocet but didn't help. Reports bleeding is heavy with clots. No fevers.  OB History    Gravida  3   Para  2   Term  2   Preterm      AB      Living  2     SAB      TAB      Ectopic      Multiple      Live Births  2           Past Medical History:  Diagnosis Date  . Rheumatoid arthritis [facility])     Past Surgical History:  Procedure Laterality Date  . CESAREAN SECTION    . CESAREAN SECTION      History reviewed. No pertinent family history.  Social History   Tobacco Use  . Smoking status: Never Smoker  . Smokeless tobacco: Never Used  Substance Use Topics  . Alcohol use: No    Frequency: Never  . Drug use: No    Allergies: No Known Allergies  No medications prior to admission.    Review of Systems  Constitutional: Negative for fever.  Gastrointestinal: Positive for abdominal pain.  Genitourinary: Positive for vaginal bleeding.   Physical Exam   Blood pressure 105/66, pulse 78, temperature 98.2 F (36.8 C), temperature source Oral, resp. rate 18, last menstrual period 03/02/2018, SpO2 99 %, unknown if currently breastfeeding.  Physical Exam  Constitutional: She is oriented to person, place, and time. She appears well-developed and well-nourished. No distress.  HENT:  Head: Normocephalic.  Neck: Normal range of motion.  Cardiovascular: Normal rate.  Respiratory: Effort normal. No respiratory distress.   Genitourinary:  Genitourinary Comments: External: no lesions or erythema Vagina: rugated, pink, moist, moderate amt blood and clots, POCs protruding from os, removed with ring forceps Cervix 1cm   Musculoskeletal: Normal range of motion.  Neurological: She is alert and oriented to person, place, and time.  Skin: Skin is warm and dry.  Psychiatric: She has a normal mood and affect.   Results for orders placed or performed during the hospital encounter of 06/03/18 (from the past 24 hour(s))  CBC     Status: Abnormal   Collection Time: 06/03/18  7:26 PM  Result Value Ref Range   WBC 5.4 4.0 - 10.5 K/uL   RBC 3.51 (L) 3.87 - 5.11 MIL/uL   Hemoglobin 9.9 (L) 12.0 - 15.0 g/dL   HCT 30.7 (L) 36.0 - 46.0 %   MCV 87.5 80.0 - 100.0 fL   MCH 28.2 26.0 - 34.0 pg   MCHC 32.2 30.0 - 36.0 g/dL   RDW 13.2 11.5 - 15.5 %   Platelets 245 150 - 400 K/uL   nRBC 0.0 0.0 - 0.2 %  hCG, quantitative, pregnancy     Status: Abnormal   Collection Time: 06/03/18 11:34 PM  Result Value Ref Range   hCG, Beta Chain, Quant, S  319 (H) <5 mIU/mL   US Ob Transvaginal  Result Date: 06/03/2018 CLINICAL DATA:  Failed pregnancy by previous ultrasound. Worsening pain. EXAM: TRANSVAGINAL OB ULTRASOUND TECHNIQUE: Transvaginal ultrasound was performed for complete evaluation of the gestation as well as the maternal uterus, adnexal regions, and pelvic cul-de-sac. COMPARISON:  06/03/2018. FINDINGS: Intrauterine gestational sac: None Yolk sac:  No Embryo:  No Subchorionic hemorrhage:  None visualized. Maternal uterus/adnexae: Right ovary: Normal Left ovary: Normal Other :None Free fluid:  None IMPRESSION: Findings meet definitive criteria for failed pregnancy. This follows SRU consensus guidelines: Diagnostic Criteria for Nonviable Pregnancy Early in the First Trimester. Alison Stalling J Med [phone]. Previously noted gestational sac within the endocervical region is no longer visualized. Electronically Signed   By: Kerby Moors M.D.   On: 06/03/2018 20:09   MAU Course  Procedures Morphine  MDM Labs and [place] ordered. POCs sent to path. Transfer of care given to L. Leftwich-Kirby, CNM Leftwich-Kirby, Kathie Dike, CNM  06/04/2018 7:10 AM   MDM:  Pt with improved pain and bleeding following exam with removal of POCs from cervical os.  [place] results indicate no POCs remain.  Discussed results with pt and her husband with language line video interpreter in [place] language.  Pt is concerned that the pain will return when she goes home.  Offered to give pt IV medication x 1 dose in MAU and if pain resolved, discharge her with pain medications at home. Evaluated pt bleeding prior to discharge and scant bleeding noted on pad x 2+hours in MAU.No pain following Dilaudid 0.5 mg IM.  Bleeding/return precautions reviewed.  Pt to f/u with hcg in 1 week at [facility] and visit with provider in 2 weeks. This appt is already set for 06/15/18.  Assessment and Plan   1. Incomplete miscarriage   2. Missed abortion   3. Abdominal pain during pregnancy in first trimester    D/C home with bleeding/return precautions F/U in office for labwork and visit with provider as scheduled   Fatima Blank, CNM 7:10 AM

## 2018-06-03 NOTE — Discharge Instructions (Signed)
()     .          .           .     " "  "  ".      .                   .                .                    .     .      .  .           .             .                .   .       .  .         .                .        .         .          .         .       .                      .         (  )        .        (D  C).   D  C      ()           .         .        ()      .     Rh      Rh        Rh.                    :               .        .            .           ().   .   .              . -                 .   .     .          .                            .         .      :         .   . -     (   )     .        .  .      .        .  .             .            .  'iijhad al'iijhad  hu alfuqdan almafaji litafl lm yualid baed (aljnin) qabl al'usbue aleishrin min alhuml. tahadath mezm halat al'iijhad fi al'ashhur althlatht al'uwlaa min alhumli. yahduth dhlk fi bed al'ahyan qabl 'an tuearif almar'at 'anaha hamil. yutlq ealaa al'iijhad aydana "al'iijhad altalaqaayiy" 'aw "fqidan alhaml almubkira". ymkn 'an yakun al'iijhad tajribat eatifiatin. tahadath mae muqadim alrieayat alkhasi bik ean 'ayi 'asyilat qad takun ladaykum hawl al'iijhad waeamaliat alhuzn wakhutat alhaml almustaqbaliat. ma hi al'asbaba?  mashakil alsabghiat aljaniniat alty tajeal min almustahil ealaa altifl altatawur bishakl tabiei.  ghalbana ma takun mashakil jayinat altifl 'aw karumusumatiha natijatan li'akhta' tahadath , ean tariq alsudfat , eindama yanqasim aljinin wayanmu. almashakil laysat mawruthatan min alwalidin.  'iisabat eunq alrahim 'aw alruhm. mashakil alhurimun. mashakil fi eunq alrahim , mithl wujud eunq alrahim ghyr kuf'a. hadha hu eindama al'ansajat fi eunq alrahim laysat qawiatan bma yakfi lieaqd alhaml.  mashakil fi alrahim , mithl alrahim ghyr altabieii 'aw al'awram alliyfiat alrahmiat 'aw altashawuhat alkhalqiati. bed alhalat altabiyati. altadakhiyn 'aw shurb alkuhul 'aw taeati almukhdirat.  alsadmat. fi kthyr min al'ahyan , sbb al'iijhad ghyr maeruf. ma hi alealamat 'aw al'aerad? nazif muhbiliun 'aw biqe , mae 'aw bidun taqalusat 'aw 'alm. 'alam 'aw tashnuj fi albatn 'aw 'asfal alzuhra. murur alsawayil 'aw al'ansajat 'aw jultat aldam min almahbil. kayf ytmu tashkhis hdha? sayaqum muqadim alrieayat bi'iijra' aikhtibar jasadi. qad yakun ladayk 'aydaan almawajaat fawq alsawtiat litakid al'iijhadi. yumkin aydana 'iijra' aikhtibarat dam 'aw bul. kayf ytmu eilaj hdha? fi bed al'ahyan , la yakun aleilaj drwryana 'iidha mararat bishakl tabieiin jmye 'ansijat aljiniyn almawjudat fi alruhmi. 'iidha baqi bed aljinin 'aw almushimat fi aljism ('iijihad ghyr Kila) , faqad tusab al'ansijat albaqiat wara'aha wayajib 'iizalatha. eadatan , ytmu tanfidh 'iijra' tamadud wakushut (D w C). '[place]' 'iijra' D w C , ytmu tawsie eunq alrahim [place]) wayutimu 'iizalat 'ayi 'ansajat jininiat 'aw mushimat mutabaqiyat birafq min alruhm.  tusaf 'adwiat almidaddat alhayawiat 'iidha kan hunak [place]tahabu. yumkin 'iieta' 'adwiat 'ukhraa litaqlil hajm alrahim Rosann Auerbach) 'iidha kan hunak alkthyr min alnazif. 'iidha kunt msabana bada' Rh salbiun wakan tiflak msabana b Rh , fasatahtaj 'iilaa jureat min aljlubiulayn almanaei Rh. satahmi hadhih alluqtat 'aya tifl mustaqbiliin min al'iisabat bimashakil fi aldami fi alhamal almuqbil atabie hadhih altaelimat fi  almanzil: qad yatlub muqadim alrieayat lak alrrahat fi alfarash 'aw qad yasmah lak bimuasalat nashat aldaw'. aistinaf alnashat hsb tawjihat muqadim alrieayat alkhasi bika.  'atlub musaeadat shakhs ma fi maswuwaliaat almanzil wal'usrat khilal hdha alwaqt.  tatabie eadad alfawt alsihiyat alty tastakhdimuha ywmyana wamadaa naqeiha (almashbeata). aiktub hadhih almaelumat. la tastakhdim [place] 'aw tumaras alaitisal aljinsiu hataa tatima almuafaqat ealayh min qibl muqadim alraeayat. - tanawul al'adwiat alty tasrif bidun wasfat tibiyat 'aw wasfat tibiyat lil'alam 'aw alainzieaj wfqana litawjihat muqadim alraeayat. la [employer]. ymkn 'an yusabib al'asbarin alnaziaf. hafiz ealaa jmye mawaeid almutabaeat mae muqadim alrieayat [place]. 'iidha kunt 'ant 'aw sharik [place] tueani min mashakil fi alhuzn , [place] 'iilaa muqadim alrieayat alkhasi bik 'aw 'abhath ean mushawrat lilmusaeadat fi altaghalub ealaa fiqdan alhuml. 'atruk wqtana kafyana lilhazn qabl muhawalat alhaml Jerold Coombe 'ukhraa. ahsil ealaa almusaeadat ealaa alfawr 'idha: ladayk tashnajat 'aw 'alam shadid fi alzuhr 'aw albatna. - tamarar julatat damawiat kabira (bhajm aljawz 'aw 'akbr) 'aw min nasij min almahbili. Basilio [place] 'ayi nasij lifahs muqadim alrieayat alkhasi bika. yazid alnazif.  ladayk 'iifrazat muhbaliat kathifat alraayihat. 'ant tusbih [place] 'aw daeifatan 'aw tughmaa ealik. ladayk qashearirat. la tahdif hadhih almaelumat 'iilaa astibdal alnasayih alty yuqadimuha lak muqadim alrieayat alsahiati. ta'akad min munaqashat 'ayi 'asyilat ladayk mae muqadim alrieayat alsihiyat alkhasi bika.

## 2018-06-04 LAB — HCG, QUANTITATIVE, PREGNANCY: hCG, Beta Chain, Quant, S: 319 m[IU]/mL — ABNORMAL HIGH (ref <5)

## 2018-06-04 MED ORDER — PRENATAL VITAMINS 28-0.8 MG PO TABS: 1.0000 | ORAL_TABLET | Freq: Every day | ORAL | 5 refills | Status: DC

## 2018-06-09 ENCOUNTER — Telehealth: Payer: Self-pay | Admitting: Pharmacy Technician

## 2018-06-09 ENCOUNTER — Other Ambulatory Visit: Payer: BLUE CROSS/BLUE SHIELD

## 2018-06-09 DIAGNOSIS — O021 Missed abortion: Secondary | ICD-10-CM

## 2018-06-09 NOTE — Telephone Encounter (Signed)
Left message for patient to see when she can come by the office.

## 2018-06-10 LAB — BETA HCG QUANT (REF LAB): hCG Quant: 32 m[IU]/mL

## 2018-06-11 NOTE — Telephone Encounter (Signed)
Spoke to patient, she will come by office to on Monday to pick up sample and to bring her copay card to set up with pharmacy.   1:53 PM Beatriz Chancellor, CPhT

## 2018-06-15 ENCOUNTER — Encounter: Payer: Self-pay | Admitting: Advanced Practice Midwife

## 2018-06-15 ENCOUNTER — Telehealth: Payer: Self-pay | Admitting: *Deleted

## 2018-06-15 ENCOUNTER — Ambulatory Visit (INDEPENDENT_AMBULATORY_CARE_PROVIDER_SITE_OTHER): Payer: BLUE CROSS/BLUE SHIELD | Admitting: Advanced Practice Midwife

## 2018-06-15 VITALS — BP 108/74 | HR 88 | Wt 162.6 lb

## 2018-06-15 DIAGNOSIS — R102 Pelvic and perineal pain: Secondary | ICD-10-CM | POA: Diagnosis not present

## 2018-06-15 DIAGNOSIS — O034 Incomplete spontaneous abortion without complication: Secondary | ICD-10-CM

## 2018-06-15 MED ORDER — TRAMADOL HCL 50 MG PO TABS: 50.0000 mg | ORAL_TABLET | Freq: Four times a day (QID) | ORAL | 0 refills | Status: DC | PRN

## 2018-06-15 NOTE — Telephone Encounter (Signed)
-----   Message from Elvera Maria, CNM sent at 06/13/2018  8:16 AM EST ----- Pt hcg continues to drop appropriately for miscarriage.  Please call to let her know and schedule her next weekly lab on 12/26, 27 or even on 12/30 if she is doing well.   She speaks some [place] but prefers the Arabic language line for communication. Thank you.

## 2018-06-15 NOTE — Progress Notes (Signed)
Pt is in the office to follow up after SAB on 06/02/18. Pt complains of right sided pelvic pain 7/10, and dark spotting. Pt states that ibuprofen helps with the pain for a little while.

## 2018-06-15 NOTE — Progress Notes (Signed)
  GYNECOLOGY PROGRESS NOTE  History:  32 y.o. G3P2002 presents to [facility] office today for follow up to miscarriage on 06/02/18.   She reports intermittent RLQ cramping pain, not well controlled with ibuprofen. Occurs only 1-2 times per day but is very painful when it happens.  Review of Systems:  Pertinent items are noted in HPI.   Objective:  Physical Exam Blood pressure 108/74, pulse 88, weight 73.8 kg, last menstrual period 03/02/2018, not currently breastfeeding. VS reviewed, nursing note reviewed,  Constitutional: well developed, well nourished, no distress HEENT: normocephalic CV: normal rate Pulm/chest wall: normal effort Breast Exam: deferred Abdomen: soft Neuro: alert and oriented x 3 Skin: warm, dry Psych: affect normal Pelvic exam: Cervix pink, visually closed, without lesion, scant white creamy discharge, vaginal walls and external genitalia normal Bimanual exam: Cervix 0/long/high, firm, anterior, neg CMT, uterus nontender, nonenlarged, adnexa without tenderness, enlargement, or mass  Recent Results (from the past 2160 hour(s))  Result Value Ref Range   ABO/RH(D)      O POS Performed at [facility], [address] [place], [place] [address]    Collection Time: 06/03/18  7:26 PM  hCG, quantitative, pregnancy     Status: Abnormal   Collection Time: 06/03/18 11:34 PM  Result Value Ref Range   hCG, Beta Chain, Quant, S 319 (H) <5 mIU/mL  Beta hCG quant (ref lab)     Status: None   Collection Time: 06/09/18  8:39 AM  Result Value Ref Range   hCG Quant 32 mIU/mL   Assessment & Plan:  1. Incomplete miscarriage --Hcg dropped significantly from 10,000+ on 10/31 to 319 on 12/11 and down to 32 on 12/17. --Pt with scant brown spotting only - B-HCG Quant - CBC --Discussed plans after pregnancy, pt interested in IUD for contraception. Offered to do now or at next visit. Pt wants to think about it.  2. Pelvic pain in female --Pt is still having pain RLQ,  intermittent, not well controlled with ibuprofen.  Has hx fibroids which may contribute. This feels like menstrual pain for her. --Rx for Tramadol 10 tablets for breakthrough pain    Fatima Blank, CNM 2:22 PM

## 2018-06-15 NOTE — Telephone Encounter (Signed)
Called patient to see if she was still coming by office today, she said she will come to office on Friday 06/19/18. Reminded patient to bring her Cimzia Copay card.

## 2018-06-15 NOTE — Telephone Encounter (Signed)
Called pt using interpreter [phone] to inform her that her hcg levels continue to drop appropriatley for a miscarriage.  Informed her that she needed to come in 12/27-12/30 for another hcg level.  Pt reports bleeding has decreased to spotting and she reports pain 10/10 on the right side of her abdomen that is a cramping pain, the same as her previous appointments and goes away when she takes ibuprofen.   Pt has appointment at [facility] today 06/15/18 and will follow up with them on when to get the bhcg drawn.

## 2018-06-16 LAB — CBC
Hematocrit: 34.2 % (ref 34.0–46.6)
Hemoglobin: 10.9 g/dL — ABNORMAL LOW (ref 11.1–15.9)
MCH: 27.2 pg (ref 26.6–33.0)
MCHC: 31.9 g/dL (ref 31.5–35.7)
MCV: 85 fL (ref 79–97)
Platelets: 327 10*3/uL (ref 150–450)
RBC: 4.01 x10E6/uL (ref 3.77–5.28)
RDW: 12.9 % (ref 12.3–15.4)
WBC: 3.9 10*3/uL (ref 3.4–10.8)

## 2018-06-16 LAB — BETA HCG QUANT (REF LAB): hCG Quant: 10 m[IU]/mL

## 2018-06-22 ENCOUNTER — Other Ambulatory Visit: Payer: BLUE CROSS/BLUE SHIELD

## 2018-06-22 ENCOUNTER — Telehealth: Payer: Self-pay | Admitting: Pharmacist

## 2018-06-22 DIAGNOSIS — O034 Incomplete spontaneous abortion without complication: Secondary | ICD-10-CM

## 2018-06-22 NOTE — Telephone Encounter (Signed)
Patient came in and brought copay card. Called [facility] and they will ship Cimzia to patient on 06/30/17. Advised patient to call if she does not receive on scheduled date.  Medication Samples have been provided to the patient.  Drug name: Cimzia       Strength: 200mg /ml        Qty: 1 box (2 syringes)  LOT: [id number] Exp.Date: 04/2019  Dosing instructions: Inject 200mg  into skin every 14 days  The patient has been instructed regarding the correct time, dose, and frequency of taking this medication, including desired effects and most common side effects.   Kathleen Nguyen 10:52 AM 06/22/2018

## 2018-06-22 NOTE — Telephone Encounter (Signed)
Patient seen in office by Rachael patient advocate for sample and to help set up shipment from specialty pharmacy.    Reviewed labs.  Patient is up to date on labs and will be due the second week of January and standing orders are in place. Patient given hand out with standing lab order instructions.  All questions encouraged and answered.  Instructed patient to call with any further questions or concerns.  Mariella Saa, PharmD, [facility] Rheumatology Clinical Pharmacist  06/22/2018 10:51 AM

## 2018-06-23 LAB — BETA HCG QUANT (REF LAB): hCG Quant: 5 m[IU]/mL

## 2018-06-30 ENCOUNTER — Other Ambulatory Visit: Payer: Self-pay | Admitting: *Deleted

## 2018-06-30 DIAGNOSIS — O021 Missed abortion: Secondary | ICD-10-CM

## 2018-06-30 DIAGNOSIS — O034 Incomplete spontaneous abortion without complication: Secondary | ICD-10-CM

## 2018-07-01 ENCOUNTER — Ambulatory Visit (INDEPENDENT_AMBULATORY_CARE_PROVIDER_SITE_OTHER): Payer: BLUE CROSS/BLUE SHIELD | Admitting: Specialist

## 2018-07-02 ENCOUNTER — Telehealth: Payer: Self-pay | Admitting: Advanced Practice Midwife

## 2018-07-02 ENCOUNTER — Other Ambulatory Visit: Payer: BLUE CROSS/BLUE SHIELD

## 2018-07-02 NOTE — Telephone Encounter (Signed)
Called pt using language line with Arabic interpreter # [phone] to cancel her appt today at [facility] for hcg.  She was seen for miscarriage follow up and on 06/22/18 her hcg was 5. She does not need additional labwork because the hcg of 5 indicates her pregnancy has resolved. She may follow up in the office PRN for any gyn concerns.  Interpreter left message for pt today.

## 2018-07-06 NOTE — Telephone Encounter (Signed)
Received a voicemail from [place] in reference to a copay for the patient. Asking if she had a copay card. Called [facility], they had added the Cimzia Copay card as a personal copay card. Rep scheduled shipment to be delivered tomorrow 07/07/2018. Advised patient to call to let me know she has received shipment, if not, we will need to call insurance to request patient to be able to use a different pharmacy.

## 2018-07-23 ENCOUNTER — Other Ambulatory Visit: Payer: Self-pay | Admitting: *Deleted

## 2018-07-23 DIAGNOSIS — Z79899 Other long term (current) drug therapy: Secondary | ICD-10-CM

## 2018-07-24 LAB — COMPLETE METABOLIC PANEL WITH GFR
AG Ratio: 1.6 (calc) (ref 1.0–2.5)
ALT: 13 U/L (ref 6–29)
AST: 16 U/L (ref 10–30)
Albumin: 4.6 g/dL (ref 3.6–5.1)
Alkaline phosphatase (APISO): 28 U/L — ABNORMAL LOW (ref 33–115)
BUN: 10 mg/dL (ref 7–25)
CO2: 27 mmol/L (ref 20–32)
Calcium: 9.5 mg/dL (ref 8.6–10.2)
Chloride: 102 mmol/L (ref 98–110)
Creat: 0.6 mg/dL (ref 0.50–1.10)
GFR, Est African American: 140 mL/min/{1.73_m2} (ref >=60)
GFR, Est Non African American: 121 mL/min/{1.73_m2} (ref >=60)
Globulin: 2.8 g/dL (calc) (ref 1.9–3.7)
Glucose, Bld: 97 mg/dL (ref 65–99)
Potassium: 4.1 mmol/L (ref 3.5–5.3)
SODIUM: 138 mmol/L (ref 135–146)
Total Bilirubin: 0.4 mg/dL (ref 0.2–1.2)
Total Protein: 7.4 g/dL (ref 6.1–8.1)

## 2018-07-24 LAB — CBC WITH DIFFERENTIAL/PLATELET
ABSOLUTE MONOCYTES: 426 {cells}/uL (ref 200–950)
Basophils Absolute: 19 cells/uL (ref 0–200)
Basophils Relative: 0.5 %
Eosinophils Absolute: 42 cells/uL (ref 15–500)
Eosinophils Relative: 1.1 %
HCT: 35.6 % (ref 35.0–45.0)
Hemoglobin: 11.8 g/dL (ref 11.7–15.5)
Lymphs Abs: 1858 cells/uL (ref 850–3900)
MCH: 29 pg (ref 27.0–33.0)
MCHC: 33.1 g/dL (ref 32.0–36.0)
MCV: 87.5 fL (ref 80.0–100.0)
MPV: 10 fL (ref 7.5–12.5)
Monocytes Relative: 11.2 %
Neutro Abs: 1455 cells/uL — ABNORMAL LOW (ref 1500–7800)
Neutrophils Relative %: 38.3 %
Platelets: 338 10*3/uL (ref 140–400)
RBC: 4.07 10*6/uL (ref 3.80–5.10)
RDW: 12.5 % (ref 11.0–15.0)
TOTAL LYMPHOCYTE: 48.9 %
WBC: 3.8 10*3/uL (ref 3.8–10.8)

## 2018-07-24 NOTE — Progress Notes (Signed)
stable °

## 2018-08-03 ENCOUNTER — Other Ambulatory Visit: Payer: Self-pay | Admitting: Pharmacist

## 2018-08-03 ENCOUNTER — Telehealth: Payer: Self-pay | Admitting: Pharmacy Technician

## 2018-08-03 DIAGNOSIS — M457 Ankylosing spondylitis of lumbosacral region: Secondary | ICD-10-CM

## 2018-08-03 MED ORDER — CERTOLIZUMAB PEGOL 2 X 200 MG/ML ~~LOC~~ KIT: 200.0000 mg | PACK | SUBCUTANEOUS | 2 refills | Status: DC

## 2018-08-03 NOTE — Telephone Encounter (Signed)
Spoke to patient, she will call me on Wednesday to [facility] to schedule her next shipment. She has 1 syringe on hand for this week's injection.  Patient also needs to schedule March F/U Appointment.

## 2018-08-03 NOTE — Progress Notes (Signed)
Patient left voicemail requesting refills.  Last Visit: 05/25/18 Next Visit: N/A (she is to return in March) Labs: 07/23/2018 stable TB Gold: 12/19/17 negative  Prescription sent to [facility].

## 2018-08-03 NOTE — Telephone Encounter (Signed)
Patient called in requesting refills for Cimzia, sent message to Amber Rph to send in refill as patient had labs drawn on 1/30. Left message for patient to advise and to call back if she has any other questions.  Patient also needs to schedule her follow up appointment for March.

## 2018-08-06 NOTE — Telephone Encounter (Signed)
Scheduled shipment of Cimzia with patient from [facility] for 2/18 delivery

## 2018-08-17 NOTE — Progress Notes (Signed)
Office Visit Note  Patient: Kathleen Nguyen             Date of Birth: Feb 24, 1986           MRN: [id number]             PCP: Clent Demark, PA-C Referring: Clent Demark, PA-C Visit Date: 08/31/2018 Occupation: @GUAROCC @  Interpreter: Doralee Albino   Subjective:  Medication management.    History of Present Illness: Kathleen Nguyen is a 33 y.o. female with history of ankylosing spondylitis. States she had a miscarriage in 05/2018 for which she took two doses of an oral medication for complete evacuation. She does not remember the name of the medication.She currently is taking an iron supplement due to anemia from her miscarriage. She denies any joint pain, SI joint discomfort or joint swelling. Reports she is doing well. She states she has noticed increased hair loss after taking the Cimzia.   Activities of Daily Living:  Patient reports morning stiffness for 0 minute.   Patient Denies nocturnal pain.  Difficulty dressing/grooming: Denies Difficulty climbing stairs: Denies Difficulty getting out of chair: Denies Difficulty using hands for taps, buttons, cutlery, and/or writing: Denies  Review of Systems  Constitutional: Positive for fatigue.  HENT: Positive for mouth dryness. Negative for mouth sores and nose dryness.   Eyes: Positive for dryness. Negative for pain.  Respiratory: Negative for shortness of breath and difficulty breathing.   Cardiovascular: Positive for palpitations. Negative for chest pain.  Gastrointestinal: Positive for constipation. Negative for blood in stool and diarrhea.  Endocrine: Negative for increased urination.  Genitourinary: Negative for difficulty urinating and painful urination.  Musculoskeletal: Negative for arthralgias, joint pain, joint swelling, myalgias, muscle weakness, morning stiffness and myalgias.  Skin: Positive for hair loss. Negative for color change, rash and sensitivity to sunlight.  Allergic/Immunologic: Negative for  susceptible to infections.  Neurological: Negative for dizziness, fainting and headaches.  Hematological: Positive for anemia.  Psychiatric/Behavioral: Negative for depressed mood and sleep disturbance. The patient is not nervous/anxious.     PMFS History:  Patient Active Problem List   Diagnosis Date Noted  . Supervision of other normal pregnancy, antepartum 05/18/2018  . Uterine adenomyoma 12/11/2016  . Tinea pedis of left foot 12/05/2016  . Pelvic pain 09/23/2016    Past Medical History:  Diagnosis Date  . Rheumatoid arthritis ([place])     History reviewed. No pertinent family history. Past Surgical History:  Procedure Laterality Date  . CESAREAN SECTION    . CESAREAN SECTION     Social History   Social History Narrative  . Not on file   Immunization History  Administered Date(s) Administered  . Tdap 12/18/2017     Objective: Vital Signs: BP 101/65 (BP Location: Left Arm, Patient Position: Sitting, Cuff Size: Normal)   Pulse 92   Resp 13   Ht 5\' 5"  (1.651 m)   Wt 169 lb 12.8 oz (77 kg)   LMP 03/02/2018   BMI 28.26 kg/m    Physical Exam Vitals signs and nursing note reviewed.  Constitutional:      Appearance: She is well-developed.  HENT:     Head: Normocephalic and atraumatic.  Eyes:     Conjunctiva/sclera: Conjunctivae normal.  Neck:     Musculoskeletal: Normal range of motion.  Cardiovascular:     Rate and Rhythm: Normal rate and regular rhythm.     Heart sounds: Normal heart sounds.  Pulmonary:     Effort: Pulmonary effort is normal.  Breath sounds: Normal breath sounds.  Abdominal:     General: Bowel sounds are normal.     Palpations: Abdomen is soft.  Lymphadenopathy:     Cervical: No cervical adenopathy.  Skin:    General: Skin is warm and dry.     Capillary Refill: Capillary refill takes less than 2 seconds.  Neurological:     Mental Status: She is alert and oriented to person, place, and time.  Psychiatric:        Behavior: Behavior  normal.      Musculoskeletal Exam: C-spine, thoracic and lumbar spine good range of motion. Shoulder joints, wrist joints, MCPs, PIPs, DIPs have good range of motion with no synovitis. No SI joint tenderness. Hip joints, knee joints, ankles, MTPs, PIPs good range of motion with no synovitis.   CDAI Exam: CDAI Score: Not documented Patient Global Assessment: Not documented; Provider Global Assessment: Not documented Swollen: Not documented; Tender: Not documented Joint Exam   Not documented   There is currently no information documented on the homunculus. Go to the Rheumatology activity and complete the homunculus joint exam.  Investigation: No additional findings.  Imaging: No results found.  Recent Labs: Lab Results  Component Value Date   WBC 3.8 07/23/2018   HGB 11.8 07/23/2018   PLT 338 07/23/2018   NA 138 07/23/2018   K 4.1 07/23/2018   CL 102 07/23/2018   CO2 27 07/23/2018   GLUCOSE 97 07/23/2018   BUN 10 07/23/2018   CREATININE 0.60 07/23/2018   BILITOT 0.4 07/23/2018   ALKPHOS 37 (L) 09/12/2016   AST 16 07/23/2018   ALT 13 07/23/2018   PROT 7.4 07/23/2018   ALBUMIN 4.8 09/12/2016   CALCIUM 9.5 07/23/2018   GFRAA 140 07/23/2018   QFTBGOLDPLUS NEGATIVE 12/19/2017    Speciality Comments: No specialty comments available.  Procedures:  No procedures performed Allergies: Patient has no known allergies.   Assessment / Plan:     Visit Diagnoses: Ankylosing spondylitis of lumbosacral region (HCC)-patient is clinically doing well on Cimzia.  She had no synovitis on examination.  No SI joint tenderness.  She denies any stiffness and had good range of motion in all her joints.  High risk medication use - Cimzia 200 mg every 14 days.  Last TB gold negative on 12/19/2017 and will monitor yearly.  Most recent CBC/CMP stable on 07/23/2018 and will monitor every 3 months.  Standing orders are in place.She was previously on enbrel (discontinued due to  pregnancy)  Sacroiliitis (HCC)-she had no SI joint tenderness on examination.  And had good range of motion.  Hair loss-patient complains of increased hair loss.  Increase protein intake was discussed.  Dry eyes-over-the-counter products were discussed.  Miscarriage-patient miscarried in December 2019.  Language barrier -we had a interpreter in the room.  Orders: Orders Placed This Encounter  Procedures  . QuantiFERON-TB Gold Plus   No orders of the defined types were placed in this encounter.   Face-to-face time spent with patient was 35 minutes. Greater than 50% of time was spent in counseling and coordination of care.  Follow-Up Instructions: Return in about 5 months (around 01/31/2019) for Ankylosing spondylitis.   Bo Merino, MD  Note - This record has been created using Editor, commissioning.  Chart creation errors have been sought, but may not always  have been located. Such creation errors do not reflect on  the standard of medical care.

## 2018-08-31 ENCOUNTER — Encounter: Payer: Self-pay | Admitting: Rheumatology

## 2018-08-31 ENCOUNTER — Ambulatory Visit (INDEPENDENT_AMBULATORY_CARE_PROVIDER_SITE_OTHER): Payer: BLUE CROSS/BLUE SHIELD | Admitting: Rheumatology

## 2018-08-31 VITALS — BP 101/65 | HR 92 | Resp 13 | Ht 65.0 in | Wt 169.8 lb

## 2018-08-31 DIAGNOSIS — Z79899 Other long term (current) drug therapy: Secondary | ICD-10-CM | POA: Diagnosis not present

## 2018-08-31 DIAGNOSIS — O039 Complete or unspecified spontaneous abortion without complication: Secondary | ICD-10-CM

## 2018-08-31 DIAGNOSIS — H04123 Dry eye syndrome of bilateral lacrimal glands: Secondary | ICD-10-CM | POA: Diagnosis not present

## 2018-08-31 DIAGNOSIS — L659 Nonscarring hair loss, unspecified: Secondary | ICD-10-CM

## 2018-08-31 DIAGNOSIS — M457 Ankylosing spondylitis of lumbosacral region: Secondary | ICD-10-CM

## 2018-08-31 DIAGNOSIS — M461 Sacroiliitis, not elsewhere classified: Secondary | ICD-10-CM

## 2018-08-31 DIAGNOSIS — Z789 Other specified health status: Secondary | ICD-10-CM

## 2018-08-31 NOTE — Patient Instructions (Signed)
Standing Labs We placed an order today for your standing lab work.    Please come back and get your standing labs in April and every 3 months TB Gold with next lab  We have open lab Monday through Friday from 8:30-11:30 AM and 1:30-4:00 PM  at the office of Dr. Bo Merino.   You may experience shorter wait times on Monday and Friday afternoons. The office is located at [address] [place], [place], [place] [address] No appointment is necessary.   Labs are drawn by [facility].  You may receive a bill from [facility] for your lab work.  If you wish to have your labs drawn at another location, please call the office 24 hours in advance to send orders.  If you have any questions regarding directions or hours of operation,  please call [phone].   Just as a reminder please drink plenty of water prior to coming for your lab work. Thanks!

## 2018-10-19 ENCOUNTER — Telehealth: Payer: Self-pay

## 2018-10-19 NOTE — Telephone Encounter (Signed)
Ms Kathleen Nguyen called me requesting an appointment to see OBGYN. She reports being pregnant. I have scheduled an appointment with center for women's health care at [facility] for Monday 4th at 10am. She understands instructions including full bladder. Honor Loh RN BSN PCCN [phone]

## 2018-10-22 IMAGING — US US PELVIS COMPLETE
1 series · 15 of 25 positions shown · non-contrast
Comparison: None

CLINICAL DATA: Right-sided pelvic pain.



[Series 1: us pelvis complete · 74 acquisitions, 15 frames shown]
[im 1/74]
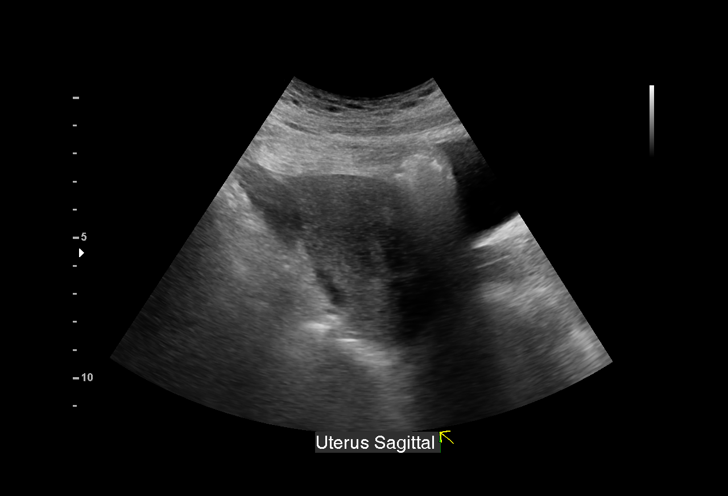
[im 7/74]
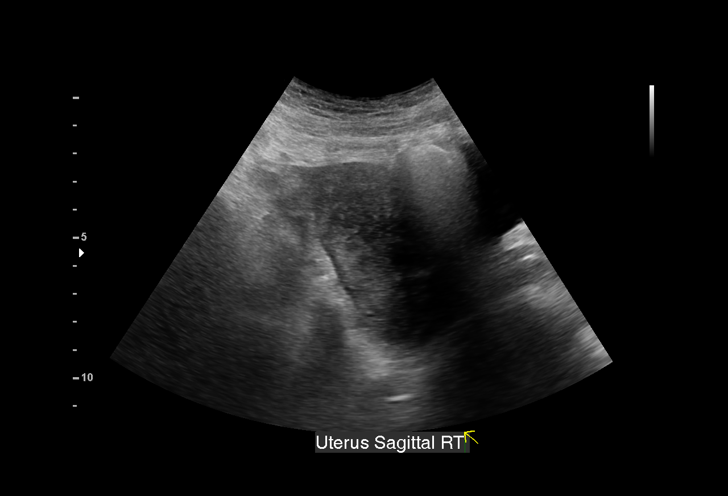
[im 13/74]
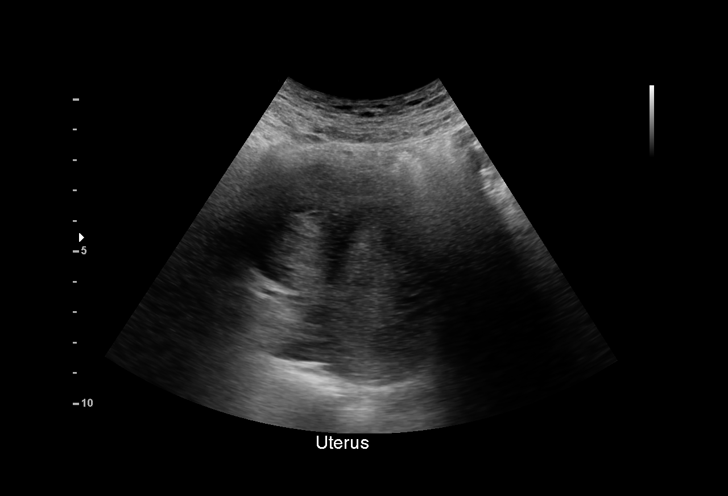
[im 16/74]
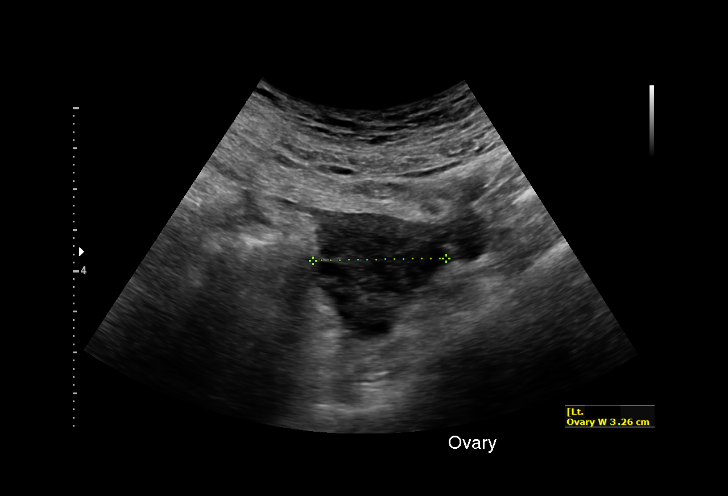
[im 22/74]
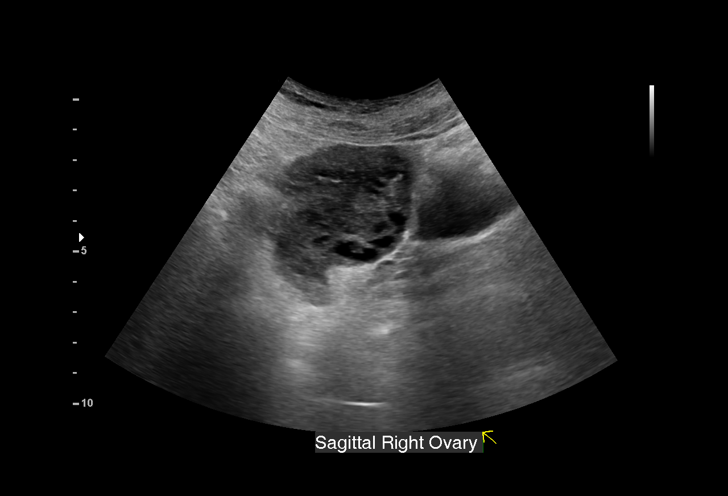
[im 28/74]
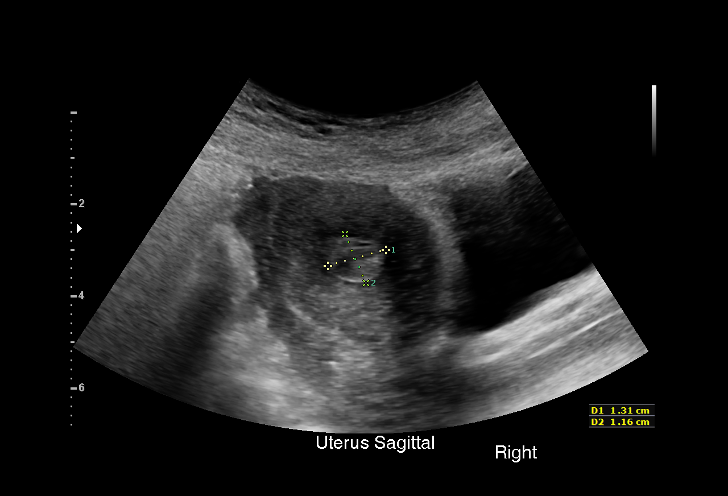
[im 31/74]
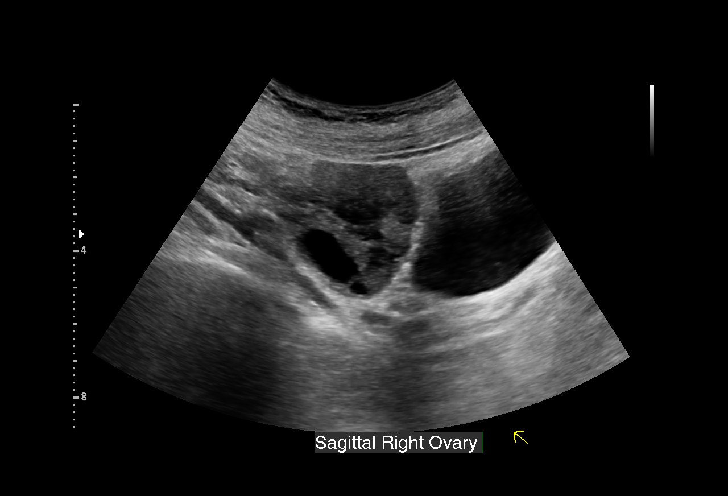
[im 37/74]
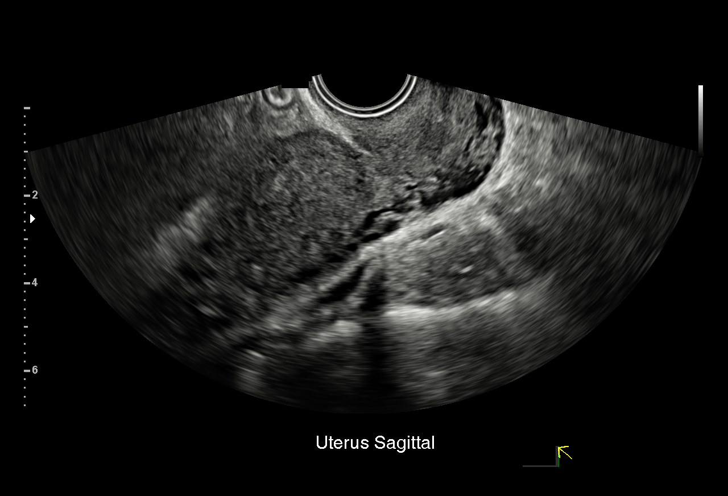
[im 43/74]
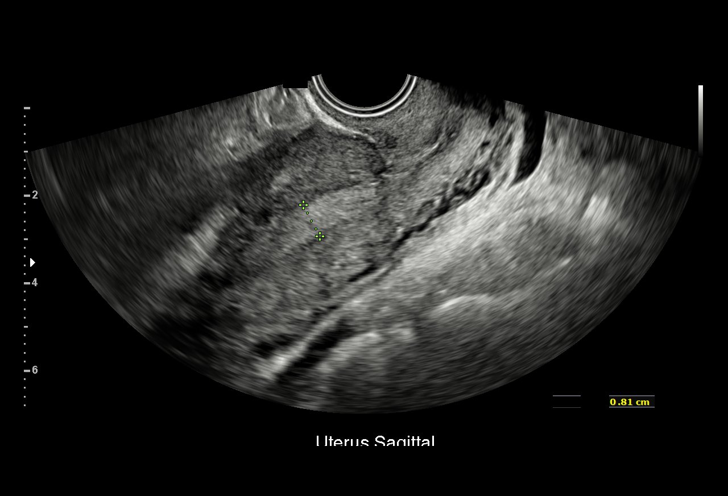
[im 46/74]
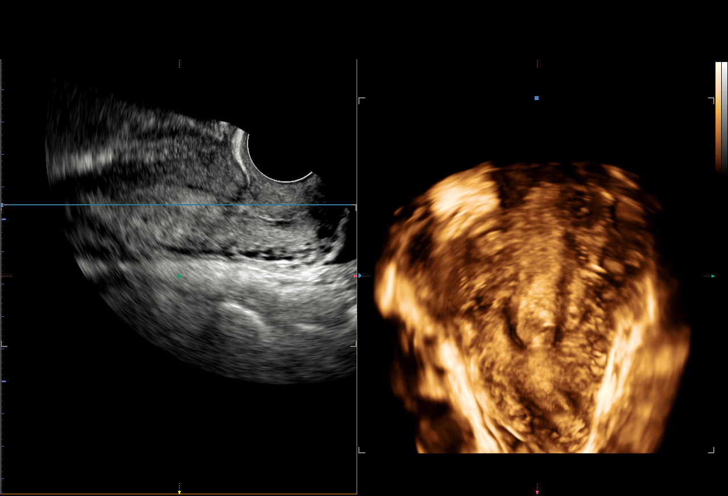
[im 52/74]
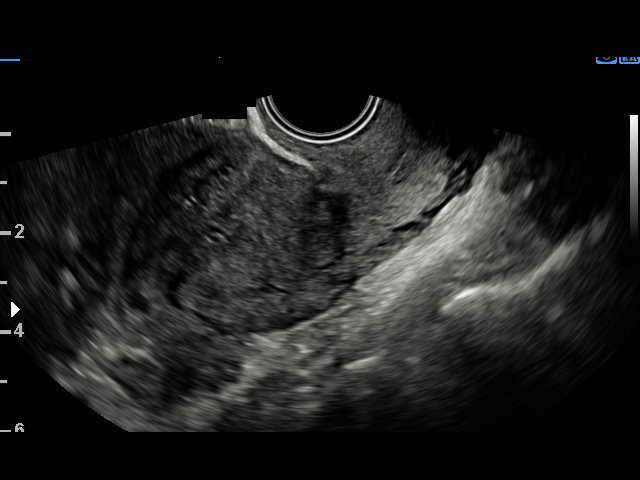
[im 58/74]
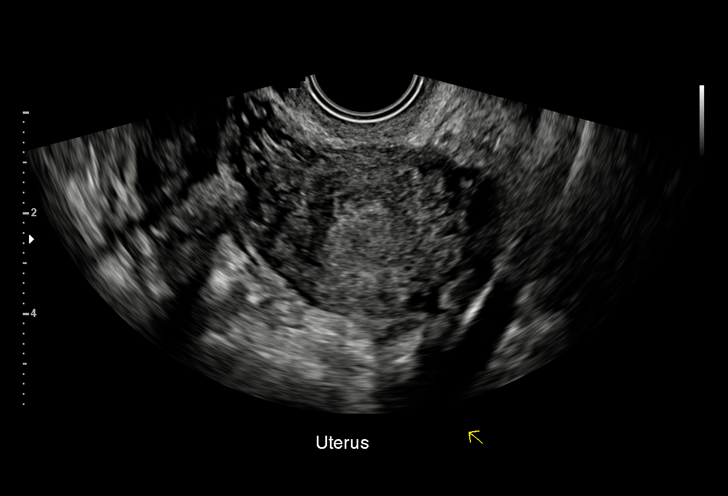
[im 61/74]
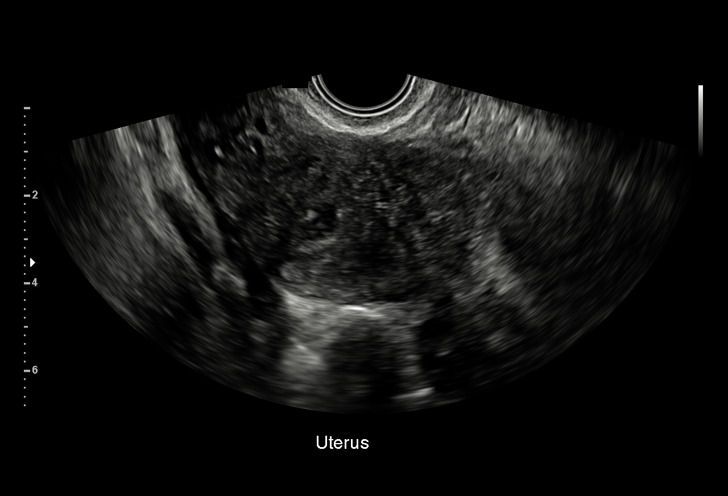
[im 67/74]
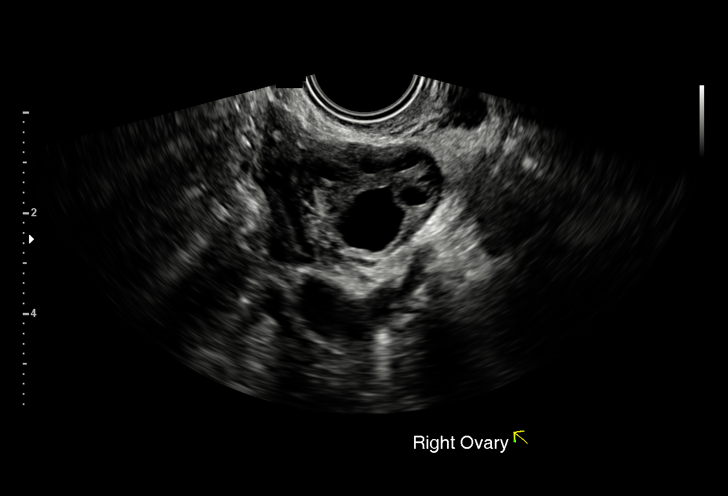
[im 74/74]
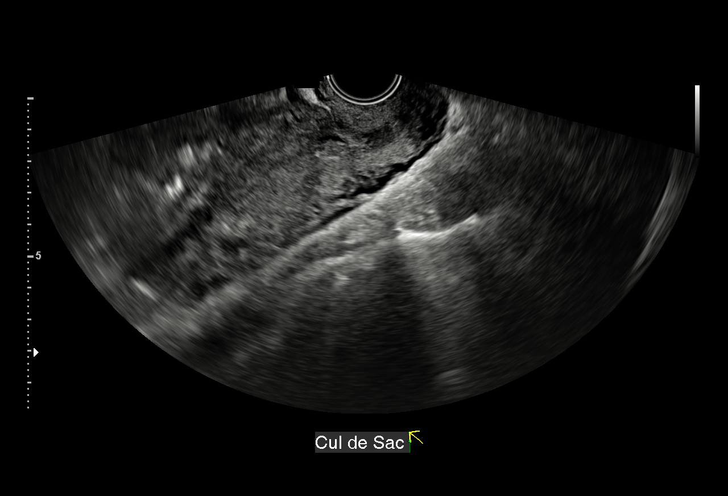

[15 of 25 positions shown; findings below may reference images not displayed]

FINDINGS: Uterus

Measurements: 7.9 x 3.8 by 6.1 cm. A 1.5 cm well-circumscribed round
hyperechoic lesion with small internal cystic foci and increased
through transmission is seen in the right fundal myometrium. This
may represent an adenomyoma or lipo-leiomyoma. No other myometrial
masses identified. C-section scar noted in anterior lower ureter
segment.

Endometrium

Thickness: 8 mm.  No focal endometrial lesion visualized.

Right ovary

Measurements: 5.0 x 2.3 by 3.1 cm. Normal appearance/no adnexal
mass.

Left ovary

Measurements: 4.8 x 2.3 x 3.2 cm. Normal appearance/no adnexal mass.

Other findings

No abnormal free fluid.
IMPRESSION: 1.5 cm hyperechoic lesion in right fundal myometrium, which could
represent an adenomyoma or lipo-leiomyoma. Consider pelvic MRI
without and with contrast for further characterization.

Normal appearance of both ovaries.  No adnexal mass identified.

## 2018-10-26 ENCOUNTER — Ambulatory Visit (INDEPENDENT_AMBULATORY_CARE_PROVIDER_SITE_OTHER): Payer: BLUE CROSS/BLUE SHIELD | Admitting: *Deleted

## 2018-10-26 ENCOUNTER — Encounter: Payer: Self-pay | Admitting: General Practice

## 2018-10-26 ENCOUNTER — Other Ambulatory Visit: Payer: Self-pay

## 2018-10-26 VITALS — BP 115/76 | HR 105 | Temp 98.5°F | Ht 60.5 in | Wt 163.0 lb

## 2018-10-26 DIAGNOSIS — Z3201 Encounter for pregnancy test, result positive: Secondary | ICD-10-CM

## 2018-10-26 DIAGNOSIS — Z3200 Encounter for pregnancy test, result unknown: Secondary | ICD-10-CM

## 2018-10-26 DIAGNOSIS — Z3480 Encounter for supervision of other normal pregnancy, unspecified trimester: Secondary | ICD-10-CM

## 2018-10-26 LAB — POCT URINE PREGNANCY: Preg Test, Ur: POSITIVE — AB

## 2018-10-26 MED ORDER — VITAFOL GUMMIES 3.33-0.333-34.8 MG PO CHEW: 3.0000 | CHEWABLE_TABLET | Freq: Every day | ORAL | 12 refills | Status: DC

## 2018-10-26 NOTE — Progress Notes (Signed)
   Kathleen Nguyen presents today for UPT. She has no unusual complaints. LMP:08/03/2018    OBJECTIVE: Appears well, in no apparent distress.  OB History    Gravida  4   Para  2   Term  2   Preterm      AB  1   Living  2     SAB  1   TAB      Ectopic      Multiple  0   Live Births  2          Home UPT Result: In-Office UPT result: Positive I have reviewed the patient's medical, obstetrical, social, and family histories, and medications.   ASSESSMENT: Positive pregnancy test  PLAN Prenatal care to be completed at: [facility] Prenatal gummies sent to pharmacy.  Derl Barrow, RN

## 2018-11-02 ENCOUNTER — Telehealth: Payer: Self-pay | Admitting: General Practice

## 2018-11-02 NOTE — Telephone Encounter (Signed)
Left message on VM due to appt time change.  Asked pt to give our office a call back with any questions or concerns.

## 2018-11-04 ENCOUNTER — Telehealth: Payer: Self-pay | Admitting: Obstetrics and Gynecology

## 2018-11-04 NOTE — Telephone Encounter (Signed)
Attempted to call patient about her upcoming appointment for an intake visit via virtual webex. Used the Interpreter [id number].

## 2018-11-05 ENCOUNTER — Ambulatory Visit (INDEPENDENT_AMBULATORY_CARE_PROVIDER_SITE_OTHER): Payer: BLUE CROSS/BLUE SHIELD | Admitting: *Deleted

## 2018-11-05 ENCOUNTER — Telehealth: Payer: Self-pay | Admitting: Family Medicine

## 2018-11-05 ENCOUNTER — Other Ambulatory Visit: Payer: Self-pay

## 2018-11-05 DIAGNOSIS — Z3480 Encounter for supervision of other normal pregnancy, unspecified trimester: Secondary | ICD-10-CM

## 2018-11-05 NOTE — Progress Notes (Signed)
I connected with  Kathleen Nguyen on 11/05/18 at  8:15 AM EDT by telephone and verified that I am speaking with the correct person using two identifiers.   I discussed the limitations, risks, security and privacy concerns of performing an evaluation and management service by telephone and the availability of in person appointments. I also discussed with the patient that there may be a patient responsible charge related to this service. The patient expressed understanding and agreed to proceed. Asked patient to do virtual visit and download webex.  She declined to do webex visit and wanted to do only telephonic visit.After explaining what we need to do today and confirming her EDD she ended phone call. We called back twice and she did not answer; we left a message we were not finished and will need to reschedule telephone visit and may have to move her new ob visit.   Linda,RN  11/05/2018  8:29 AM

## 2018-11-05 NOTE — Telephone Encounter (Signed)
Attempted to contact patient with an Arabic interpret ID # [phone] to inform patient of her rescheduled New OB intake appointment (5/18 @ 2:15). No answer, left detailed message with the appointment information and that the appointment would be a virtual visit. App information and instruction on how to get the app were left with office number if she had any questions or needed to reschedule.

## 2018-11-06 ENCOUNTER — Telehealth: Payer: Self-pay | Admitting: Obstetrics and Gynecology

## 2018-11-06 NOTE — Telephone Encounter (Signed)
Called the patient to inform of upcoming appointment with the arabic interrupter (ID [id number]). Also educated of the virtual visit via cisco webex. The patient stated she has the app downloaded.

## 2018-11-09 ENCOUNTER — Encounter: Payer: BLUE CROSS/BLUE SHIELD | Admitting: Advanced Practice Midwife

## 2018-11-09 ENCOUNTER — Ambulatory Visit (INDEPENDENT_AMBULATORY_CARE_PROVIDER_SITE_OTHER): Payer: BLUE CROSS/BLUE SHIELD | Admitting: *Deleted

## 2018-11-09 ENCOUNTER — Encounter: Payer: BLUE CROSS/BLUE SHIELD | Admitting: Obstetrics and Gynecology

## 2018-11-09 ENCOUNTER — Other Ambulatory Visit: Payer: Self-pay

## 2018-11-09 DIAGNOSIS — Z3480 Encounter for supervision of other normal pregnancy, unspecified trimester: Secondary | ICD-10-CM

## 2018-11-09 MED ORDER — AMBULATORY NON FORMULARY MEDICATION: 1.0000 | 0 refills | Status: DC

## 2018-11-09 NOTE — Progress Notes (Signed)
I connected with  [name] on 11/09/18 at  2:15 PM EDT by telephoneand [employer] Webex with in person interpreter and verified that I am speaking with the correct person using two identifiers.   I discussed the limitations, risks, security and privacy concerns of performing an evaluation and management service by telephone and the availability of in person appointments. I also discussed with the patient that there may be a patient responsible charge related to this service. The patient expressed understanding and agreed to proceed.  Dolores Hoose, RN 11/09/2018  2:16 PM   1415:Attempted to connect via Webex but pt unable to.   1422:When contacted to see if pt was having difficulties, pt did not pick up the phone.   1423: contacted pt again and gave instruction for webex and were able to connect.     Pt states she is having some burning with urination but denies any other s/s of infection.  She also requests to know if it is ok for her to continue with the Cimza injections that she is taking for arthritis.  Discussed with Dr. Rip Harbour.  Pt has visit with provider on 11/25/18 and per Dr. Rip Harbour, pt should not receive the injections until she has a chance to discuss with the provider during that appointment and, as long as she does not develop any further UTI symptoms, will have her urine tested at that visit for infection.  Pt verbalized understanding.   BP cuff ordered placed and message sent to front office staff.  Pt instructed to bring the cuff to her visit with the provider on 6/3 to be instructed in it's use.  Pt verbalized understanding.  Anatomy ultrasound scheduled for June 22 @ 1245.

## 2018-11-11 ENCOUNTER — Encounter: Payer: Self-pay | Admitting: Nurse Practitioner

## 2018-11-11 DIAGNOSIS — M459 Ankylosing spondylitis of unspecified sites in spine: Secondary | ICD-10-CM | POA: Insufficient documentation

## 2018-11-19 ENCOUNTER — Encounter: Payer: BLUE CROSS/BLUE SHIELD | Admitting: Obstetrics and Gynecology

## 2018-11-24 ENCOUNTER — Telehealth: Payer: Self-pay

## 2018-11-24 NOTE — Telephone Encounter (Signed)
Changed the patient's appointment to face to face provider. Called the patient and left a detailed voicemail of the new date and time.

## 2018-11-25 ENCOUNTER — Encounter: Payer: BLUE CROSS/BLUE SHIELD | Admitting: Nurse Practitioner

## 2018-12-04 ENCOUNTER — Other Ambulatory Visit: Payer: Self-pay

## 2018-12-04 ENCOUNTER — Ambulatory Visit (INDEPENDENT_AMBULATORY_CARE_PROVIDER_SITE_OTHER): Payer: BC Managed Care – PPO | Admitting: Obstetrics & Gynecology

## 2018-12-04 VITALS — BP 97/59 | HR 96 | Temp 98.7°F | Wt 168.6 lb

## 2018-12-04 DIAGNOSIS — Z3A17 17 weeks gestation of pregnancy: Secondary | ICD-10-CM

## 2018-12-04 DIAGNOSIS — Z789 Other specified health status: Secondary | ICD-10-CM | POA: Insufficient documentation

## 2018-12-04 DIAGNOSIS — O99210 Obesity complicating pregnancy, unspecified trimester: Secondary | ICD-10-CM

## 2018-12-04 DIAGNOSIS — O99112 Other diseases of the blood and blood-forming organs and certain disorders involving the immune mechanism complicating pregnancy, second trimester: Secondary | ICD-10-CM

## 2018-12-04 DIAGNOSIS — Z3480 Encounter for supervision of other normal pregnancy, unspecified trimester: Secondary | ICD-10-CM

## 2018-12-04 DIAGNOSIS — Z98891 History of uterine scar from previous surgery: Secondary | ICD-10-CM

## 2018-12-04 NOTE — Progress Notes (Signed)
  Subjective:    Kathleen Nguyen is a married P2 from [place] being seen today for her first obstetrical visit.  This is a planned pregnancy. She is at [redacted]w[redacted]d gestation. Her obstetrical history is significant for obesity and previous c/s x 2.  Relationship with FOB: spouse, living together. Patient does intend to breast feed. Pregnancy history fully reviewed.  Patient reports "I have an issue with my heart beat". She says that it is "pretty high".  Review of Systems:   Review of Systems  Objective:     BP (!) 97/59   Pulse 96   Temp 98.7 F (37.1 C)   Wt 168 lb 9.6 oz (76.5 kg)   LMP 08/03/2018   BMI 32.39 kg/m  Physical Exam  Exam [place] interpretor used for the visit Breathing, conversing, and ambulating normally Well nourished, well hydrated Black female, no apparent distress Heart- rrr Lungs- CTAB Abd- benign, gravid  FH- at umbilicus Assessment:    Pregnancy: [id number] Patient Active Problem List   Diagnosis Date Noted  . Language barrier 12/04/2018  . Ankylosing spondylitis ([place]) 11/11/2018  . Supervision of other normal pregnancy, antepartum 05/18/2018  . Uterine adenomyoma 12/11/2016  . Tinea pedis of left foot 12/05/2016  . Pelvic pain 09/23/2016       Plan:     Initial labs drawn. Prenatal vitamins. Problem list reviewed and updated. Anatomy u/s ordered Follow up in 4 weeks, virtual  She has a BP cuff at home I have encouraged her to buy a scale for home use. Baby scripts I have encouraged her to gain less than 20 pounds in this pregnancy. Check TSH, HBA1C, pr/cr/comp meta NIPS today    Emily Filbert 12/04/2018

## 2018-12-05 LAB — PROTEIN / CREATININE RATIO, URINE
Creatinine, Urine: 106.6 mg/dL
Protein, Ur: 8.4 mg/dL
Protein/Creat Ratio: 79 mg/g creat (ref 0–200)

## 2018-12-06 LAB — URINE CULTURE, OB REFLEX: Organism ID, Bacteria: NO GROWTH

## 2018-12-06 LAB — CULTURE, OB URINE

## 2018-12-07 ENCOUNTER — Encounter: Payer: Self-pay | Admitting: Obstetrics & Gynecology

## 2018-12-07 DIAGNOSIS — O99019 Anemia complicating pregnancy, unspecified trimester: Secondary | ICD-10-CM | POA: Insufficient documentation

## 2018-12-07 LAB — OBSTETRIC PANEL, INCLUDING HIV
Antibody Screen: NEGATIVE
Basophils Absolute: 0 10*3/uL (ref 0.0–0.2)
Basos: 0 %
EOS (ABSOLUTE): 0.1 10*3/uL (ref 0.0–0.4)
Eos: 1 %
HIV Screen 4th Generation wRfx: NONREACTIVE
Hematocrit: 32.8 % — ABNORMAL LOW (ref 34.0–46.6)
Hemoglobin: 10.5 g/dL — ABNORMAL LOW (ref 11.1–15.9)
Hepatitis B Surface Ag: NEGATIVE
Immature Grans (Abs): 0.1 10*3/uL (ref 0.0–0.1)
Immature Granulocytes: 1 %
Lymphocytes Absolute: 1.7 10*3/uL (ref 0.7–3.1)
Lymphs: 27 %
MCH: 28.1 pg (ref 26.6–33.0)
MCHC: 32 g/dL (ref 31.5–35.7)
MCV: 88 fL (ref 79–97)
Monocytes Absolute: 0.4 10*3/uL (ref 0.1–0.9)
Monocytes: 7 %
Neutrophils Absolute: 3.8 10*3/uL (ref 1.4–7.0)
Neutrophils: 64 %
Platelets: 308 10*3/uL (ref 150–450)
RBC: 3.74 x10E6/uL — ABNORMAL LOW (ref 3.77–5.28)
RDW: 13.4 % (ref 11.7–15.4)
RPR Ser Ql: NONREACTIVE
Rh Factor: POSITIVE
Rubella Antibodies, IGG: 2.52 index (ref >0.99)
WBC: 6 10*3/uL (ref 3.4–10.8)

## 2018-12-07 LAB — HEMOGLOBIN A1C
Est. average glucose Bld gHb Est-mCnc: 103 mg/dL
Hgb A1c MFr Bld: 5.2 % (ref 4.8–5.6)

## 2018-12-07 LAB — COMPREHENSIVE METABOLIC PANEL
ALT: 12 IU/L (ref 0–32)
AST: 12 IU/L (ref 0–40)
Albumin/Globulin Ratio: 1.5 (ref 1.2–2.2)
Albumin: 4 g/dL (ref 3.8–4.8)
Alkaline Phosphatase: 31 IU/L — ABNORMAL LOW (ref 39–117)
BUN/Creatinine Ratio: 11 (ref 9–23)
BUN: 5 mg/dL — ABNORMAL LOW (ref 6–20)
Bilirubin Total: <0.2 mg/dL (ref 0.0–1.2)
CO2: 19 mmol/L — ABNORMAL LOW (ref 20–29)
Calcium: 9.3 mg/dL (ref 8.7–10.2)
Chloride: 101 mmol/L (ref 96–106)
Creatinine, Ser: 0.45 mg/dL — ABNORMAL LOW (ref 0.57–1.00)
GFR calc Af Amer: 153 mL/min/{1.73_m2} (ref >59)
GFR calc non Af Amer: 133 mL/min/{1.73_m2} (ref >59)
Globulin, Total: 2.7 g/dL (ref 1.5–4.5)
Glucose: 78 mg/dL (ref 65–99)
Potassium: 4.1 mmol/L (ref 3.5–5.2)
Sodium: 136 mmol/L (ref 134–144)
Total Protein: 6.7 g/dL (ref 6.0–8.5)

## 2018-12-07 LAB — TSH: TSH: 0.935 u[IU]/mL (ref 0.450–4.500)

## 2018-12-09 ENCOUNTER — Telehealth: Payer: Self-pay | Admitting: *Deleted

## 2018-12-09 NOTE — Telephone Encounter (Signed)
-----   Message from Emily Filbert, MD sent at 12/07/2018 11:44 AM EDT ----- Please let her know that she needs to take an over the counter iron pill 1-2 times per day due to mild anemia. She does not have Mychart. Thanks

## 2018-12-09 NOTE — Telephone Encounter (Signed)
Patient called back and asked for [place] to call her back. I called her with [place] interpreter [phone] and explained Dr.Dove wanted her to know she is a little anemic and she reccommends she take otc iron 1- 2 times per day. She asked me if her other lab tests were ok. I reviewed her negative /normal tests including urine culture, rpr, hiv, tsh. I explained we will check her hemoglobin again around 28 weeks I reveiwed her next ob and [place] appointments. She voices understanding.  Linda,RN

## 2018-12-09 NOTE — Telephone Encounter (Signed)
I called Ainsley with [place] [phone] and left a message I am calling with information for you- please call our office back.   Will send letter.  Linda,RN

## 2018-12-11 ENCOUNTER — Telehealth: Payer: Self-pay | Admitting: *Deleted

## 2018-12-11 NOTE — Telephone Encounter (Signed)
Patient contacted the office stating she is currently 5 months pregnant. Patient states she is having sharp pain in her back. Patient states she is on Cimzia and stopped it for 4 months. Patient restarted her Cimzia yesterday 12/09/18. Patient advised per Dr. Estanislado Pandy okay to continue the Cimzia and to contact the obgyn regarding the back pain and what she may take for the back pain during pregnancy. Patient verbalized understanding.

## 2018-12-14 ENCOUNTER — Ambulatory Visit (HOSPITAL_COMMUNITY): Payer: BC Managed Care – PPO

## 2018-12-17 NOTE — Telephone Encounter (Signed)
Opened in error

## 2018-12-21 ENCOUNTER — Other Ambulatory Visit (HOSPITAL_COMMUNITY): Payer: Self-pay | Admitting: *Deleted

## 2018-12-21 ENCOUNTER — Encounter (HOSPITAL_COMMUNITY): Payer: Self-pay | Admitting: *Deleted

## 2018-12-21 ENCOUNTER — Ambulatory Visit (HOSPITAL_COMMUNITY): Payer: BC Managed Care – PPO | Admitting: *Deleted

## 2018-12-21 ENCOUNTER — Other Ambulatory Visit: Payer: Self-pay

## 2018-12-21 ENCOUNTER — Ambulatory Visit (HOSPITAL_COMMUNITY)
Admission: RE | Admit: 2018-12-21 | Discharge: 2018-12-21 | Disposition: A | Discharge status: Home / Self Care | Payer: BC Managed Care – PPO | Source: Ambulatory Visit | Attending: Obstetrics & Gynecology | Admitting: Obstetrics & Gynecology

## 2018-12-21 DIAGNOSIS — Z3480 Encounter for supervision of other normal pregnancy, unspecified trimester: Secondary | ICD-10-CM | POA: Insufficient documentation

## 2018-12-21 DIAGNOSIS — O99019 Anemia complicating pregnancy, unspecified trimester: Secondary | ICD-10-CM | POA: Insufficient documentation

## 2018-12-21 DIAGNOSIS — Z98891 History of uterine scar from previous surgery: Secondary | ICD-10-CM | POA: Insufficient documentation

## 2018-12-21 DIAGNOSIS — Z789 Other specified health status: Secondary | ICD-10-CM

## 2018-12-21 DIAGNOSIS — Z362 Encounter for other antenatal screening follow-up: Secondary | ICD-10-CM

## 2018-12-23 ENCOUNTER — Telehealth: Payer: Self-pay | Admitting: Pharmacist

## 2018-12-23 DIAGNOSIS — M457 Ankylosing spondylitis of lumbosacral region: Secondary | ICD-10-CM

## 2018-12-23 NOTE — Telephone Encounter (Signed)
Patient called requesting refill of Cimzia.  She decided to go off of Cimzia herself due to pregnancy and safety concerns.  She restarted Cimzia at home and has 1 injection.    Reiterated that Cimzia is safe to take during pregnancy and breast-feeding.  She is due for a CBC and TB Gold.  Informed patient that she needs labs prior to a refill.  Patient verbalized understanding and will come for labs at her convenience.    Patient asked if her co-pay card is still active.  Informed patient it should still be active.  Patient advocate will help set up new co-pay card if needed.  Patient verbalized under standing.  All questions encouraged and answered.  Instructed patient to call with any further questions or concerns.  Mariella Saa, PharmD, [facility] Rheumatology Clinical Pharmacist  12/23/2018 11:35 AM

## 2018-12-28 ENCOUNTER — Other Ambulatory Visit: Payer: Self-pay

## 2018-12-28 DIAGNOSIS — Z79899 Other long term (current) drug therapy: Secondary | ICD-10-CM

## 2018-12-29 ENCOUNTER — Encounter: Payer: Self-pay | Admitting: *Deleted

## 2018-12-29 NOTE — Progress Notes (Signed)
Anemia noted.  Please forward labs to her PCP and OB/GYN.

## 2018-12-30 LAB — COMPLETE METABOLIC PANEL WITH GFR
AG Ratio: 1.3 (calc) (ref 1.0–2.5)
ALT: 10 U/L (ref 6–29)
AST: 11 U/L (ref 10–30)
Albumin: 3.7 g/dL (ref 3.6–5.1)
Alkaline phosphatase (APISO): 26 U/L — ABNORMAL LOW (ref 31–125)
BUN/Creatinine Ratio: 18 (calc) (ref 6–22)
BUN: 7 mg/dL (ref 7–25)
CO2: 23 mmol/L (ref 20–32)
Calcium: 8.9 mg/dL (ref 8.6–10.2)
Chloride: 104 mmol/L (ref 98–110)
Creat: 0.39 mg/dL — ABNORMAL LOW (ref 0.50–1.10)
GFR, Est African American: 160 mL/min/{1.73_m2} (ref >=60)
GFR, Est Non African American: 138 mL/min/{1.73_m2} (ref >=60)
Globulin: 2.9 g/dL (calc) (ref 1.9–3.7)
Glucose, Bld: 86 mg/dL (ref 65–99)
Potassium: 3.8 mmol/L (ref 3.5–5.3)
Sodium: 136 mmol/L (ref 135–146)
Total Bilirubin: 0.3 mg/dL (ref 0.2–1.2)
Total Protein: 6.6 g/dL (ref 6.1–8.1)

## 2018-12-30 LAB — QUANTIFERON-TB GOLD PLUS
Mitogen-NIL: >10 IU/mL
NIL: 0.02 IU/mL
QuantiFERON-TB Gold Plus: NEGATIVE
TB1-NIL: 0 IU/mL
TB2-NIL: 0 IU/mL

## 2018-12-30 LAB — CBC WITH DIFFERENTIAL/PLATELET
Absolute Monocytes: 605 cells/uL (ref 200–950)
Basophils Absolute: 22 cells/uL (ref 0–200)
Basophils Relative: 0.3 %
Eosinophils Absolute: 50 cells/uL (ref 15–500)
Eosinophils Relative: 0.7 %
HCT: 30.7 % — ABNORMAL LOW (ref 35.0–45.0)
Hemoglobin: 9.9 g/dL — ABNORMAL LOW (ref 11.7–15.5)
Lymphs Abs: 1850 cells/uL (ref 850–3900)
MCH: 28.5 pg (ref 27.0–33.0)
MCHC: 32.2 g/dL (ref 32.0–36.0)
MCV: 88.5 fL (ref 80.0–100.0)
MPV: 9.9 fL (ref 7.5–12.5)
Monocytes Relative: 8.4 %
Neutro Abs: 4673 cells/uL (ref 1500–7800)
Neutrophils Relative %: 64.9 %
Platelets: 304 10*3/uL (ref 140–400)
RBC: 3.47 10*6/uL — ABNORMAL LOW (ref 3.80–5.10)
RDW: 12.9 % (ref 11.0–15.0)
Total Lymphocyte: 25.7 %
WBC: 7.2 10*3/uL (ref 3.8–10.8)

## 2018-12-30 NOTE — Telephone Encounter (Signed)
Called patient to assist with getting Cimzia refill scheduled with [facility], patient requested call back at 3pm. Will follow up.  10:22 AM Beatriz Chancellor, CPhT

## 2018-12-30 NOTE — Telephone Encounter (Signed)
Called [facility] with patient and scheduled next shipment of Cimzia to her home to deliver tomorrow 12/31/2018. Advised patient to call office to advise if she does not receive her shipment, so we can follow up.  After this fill, she is out of refills. Will need to send in a new prescription.  Phone# [phone]  3:18 PM Kathleen Nguyen, CPhT

## 2018-12-31 ENCOUNTER — Telehealth: Payer: Self-pay | Admitting: Women's Health

## 2018-12-31 ENCOUNTER — Telehealth: Payer: Self-pay | Admitting: *Deleted

## 2018-12-31 MED ORDER — CERTOLIZUMAB PEGOL 2 X 200 MG/ML ~~LOC~~ KIT: 200.0000 mg | PACK | SUBCUTANEOUS | 2 refills | Status: DC

## 2018-12-31 NOTE — Telephone Encounter (Signed)
Last Visit: 08/31/2018 Next Visit: N/A Labs: WNL except for anemia; faxed to OBGYN 12/28/2018 TB Gold: negative 12/28/2018  Okay to refill Cimzia.

## 2018-12-31 NOTE — Addendum Note (Signed)
Addended by: Mariella Saa C on: 12/31/2018 09:09 AM   Modules accepted: Orders

## 2018-12-31 NOTE — Progress Notes (Signed)
Office Visit Note  Patient: Kathleen Nguyen             Date of Birth: 15-Jul-1985           MRN: [id number]             PCP: Clent Demark, PA-C Referring: Clent Demark, PA-C Visit Date: 01/04/2019 Occupation: @GUAROCC @  Subjective:  Medication monitoring     History of Present Illness: Kathleen Nguyen is a 33 y.o. female with history of ankylosing spondylitis. She restarted Cimzia in June after holding for several months due to pregnancy.  She is currently [redacted] weeks pregnant.  She is on Cimzia 200 mg subcutaneous injections every 14 days.  She has not had any recent flares.  She is having some midline spinal tenderness in the lumbar region. She has no SI joint pain.  She has morning stiffness for about 5-10 minutes. She denies any other joint pain or joint swelling. She has not had any eye pain or inflammation.  She has not had any recent rashes. Her recent lab work revealed anemia and she is taking ferrous sulfate and prenatal vitamins.      Activities of Daily Living:  Patient reports morning stiffness for 5-10 minutes.   Patient Denies nocturnal pain.  Difficulty dressing/grooming: Denies Difficulty climbing stairs: Denies Difficulty getting out of chair: Denies Difficulty using hands for taps, buttons, cutlery, and/or writing: Denies  Review of Systems  Constitutional: Positive for fatigue.  HENT: Positive for mouth dryness. Negative for mouth sores and nose dryness.   Eyes: Positive for dryness. Negative for pain and itching.  Respiratory: Negative for shortness of breath, wheezing and difficulty breathing.   Cardiovascular: Negative for palpitations and swelling in legs/feet.  Gastrointestinal: Negative for abdominal pain, constipation and diarrhea.  Endocrine: Positive for increased urination.  Genitourinary: Negative for painful urination and pelvic pain.  Musculoskeletal: Positive for arthralgias, joint pain and morning stiffness. Negative for joint swelling.   Skin: Negative for rash and redness.  Allergic/Immunologic: Negative for susceptible to infections.  Neurological: Positive for headaches. Negative for dizziness, light-headedness, memory loss and weakness.  Hematological: Negative for swollen glands.  Psychiatric/Behavioral: Positive for sleep disturbance. Negative for confusion.    PMFS History:  Patient Active Problem List   Diagnosis Date Noted  . 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 Anemia in pregnancy 12/07/2018  . Language barrier 12/04/2018  . History of cesarean delivery 12/04/2018  . Ankylosing spondylitis ([place]) 11/11/2018  . Supervision of other normal pregnancy, antepartum 05/18/2018  . Uterine adenomyoma 12/11/2016  . Tinea pedis of left foot 12/05/2016  . Pelvic pain 09/23/2016    Past Medical History:  Diagnosis Date  . Ankylosing spondylitis ([place])   . Rheumatoid arthritis ([place])     Family History  Problem Relation Age of Onset  . Arthritis/Rheumatoid Mother    Past Surgical History:  Procedure Laterality Date  . CESAREAN SECTION    . CESAREAN SECTION     Social History   Social History Narrative  . Not on file   Immunization History  Administered Date(s) Administered  . Tdap 12/18/2017     Objective: Vital Signs: BP 93/65 (BP Location: Left Arm, Patient Position: Sitting, Cuff Size: Normal)   Pulse 98   Resp 12   Ht 5\' 5"  (1.651 m)   Wt 178 lb (80.7 kg)   LMP 08/03/2018   BMI 29.62 kg/m    Physical Exam Vitals signs and nursing note reviewed.  Constitutional:  Appearance: She is well-developed.  HENT:     Head: Normocephalic and atraumatic.  Eyes:     Conjunctiva/sclera: Conjunctivae normal.  Neck:     Musculoskeletal: Normal range of motion.  Cardiovascular:     Rate and Rhythm: Normal rate and regular rhythm.     Heart sounds: Normal heart sounds.  Pulmonary:     Effort: Pulmonary effort is normal.     Breath sounds: Normal breath sounds.  Abdominal:     General: Bowel  sounds are normal.     Palpations: Abdomen is soft.  Lymphadenopathy:     Cervical: No cervical adenopathy.  Skin:    General: Skin is warm and dry.     Capillary Refill: Capillary refill takes less than 2 seconds.  Neurological:     Mental Status: She is alert and oriented to person, place, and time.  Psychiatric:        Behavior: Behavior normal.      Musculoskeletal Exam: C-spine, thoracic spine, lumbar spine good range of motion.  She has midline spinal tenderness in lumbar region.  No SI joint tenderness.  Shoulder joints, elbow joints, wrist joints, MCPs, PIPs, DIPs good range of motion no synovitis.  She has complete fist formation bilaterally.  Hip joints, knee joints, ankle joints, MCPs, PIPs and DIPs good range of motion no synovitis.  No warmth or effusion of bilateral knee joints.  No tenderness or swelling of ankle joints.  No Achilles tendinitis or plantar fasciitis.  CDAI Exam: CDAI Score: - Patient Global: -; Provider Global: - Swollen: -; Tender: - Joint Exam   No joint exam has been documented for this visit   There is currently no information documented on the homunculus. Go to the Rheumatology activity and complete the homunculus joint exam.  Investigation: No additional findings.  Imaging: US Ob Detail + 14 Wk  Result Date: 12/22/2018 ----------------------------------------------------------------------  OBSTETRICS REPORT                        (Signed Final 12/22/2018 07:38 am) ---------------------------------------------------------------------- Patient Info  ID #:       [id number]                          D.O.B.:  [birth year] (33 yrs)  Name:       [facility]                   Visit Date: 12/21/2018 01:32 pm ---------------------------------------------------------------------- Performed By  Performed By:     Jeanene Erb BS,      Ref. Address:      [address]  [place], [place]  Attending:        Sander Nephew      [facility]                    MD                                        [facility]  Referred By:      Emily Filbert MD ---------------------------------------------------------------------- Orders   #  Description                          Code         Ordered By   1  US OB DETAIL + 14 WK                 76811.0      MYRA DOVE  ----------------------------------------------------------------------   #  Order #                    Accession #                 Episode #   1  [id number]                  [id number]                  [id number]  ---------------------------------------------------------------------- Indications   [redacted] weeks gestation of pregnancy                Z3A.20   Encounter for antenatal screening for          Z36.3   malformations   Uterine fibroids                               O34.10   Anemia during pregnancy in second trimester    O99.012   Previous cesarean delivery, antepartum         I43.329   Obesity complicating pregnancy, second         O99.212   trimester (BMI 32)  ---------------------------------------------------------------------- Vital Signs  Weight (lb): 168                               Height:        5'0"  BMI:         32.81 ---------------------------------------------------------------------- Fetal Evaluation  Num Of Fetuses:          1  Fetal Heart Rate(bpm):   158  Cardiac Activity:        Observed  Presentation:            Cephalic  Placenta:                Anterior  P. Cord Insertion:       Visualized  Amniotic Fluid  AFI  FV:      Within normal limits                              Largest Pocket(cm)                              3.4 ---------------------------------------------------------------------- Biometry  BPD:      45.5  mm     G. Age:  19w 5d         39  %    CI:        68.51    %    70 - 86                                                          FL/HC:       17.7  %    16.8 - 19.8  HC:      175.7  mm     G. Age:  20w 1d         46  %    HC/AC:       1.18       1.09 - 1.39  AC:      148.8  mm     G. Age:  20w 1d         49  %    FL/BPD:      68.4  %  FL:       31.1  mm     G. Age:  19w 5d         30  %    FL/AC:       20.9  %    20 - 24  HUM:      31.4  mm     G. Age:  20w 3d         66  %  Est. FW:     321   gm   0 lb 11 oz      41  % ---------------------------------------------------------------------- OB History  Gravidity:    4         Term:   2        Prem:   0        SAB:   1  TOP:          0       Ectopic:  0        Living: 2 ---------------------------------------------------------------------- Gestational Age  LMP:           20w 0d        Date:  08/03/18                 EDD:   05/10/19  U/S Today:     20w 0d                                        EDD:   05/10/19  Best:          Hyacinth Meeker 0d     Det. By:  LMP  (08/03/18)          EDD:  05/10/19 ---------------------------------------------------------------------- Anatomy  Cranium:               Appears normal         Aortic Arch:            Not well visualized  Cavum:                 Appears normal         Ductal Arch:            Appears normal  Ventricles:            Appears normal         Diaphragm:              Appears normal  Choroid Plexus:        Appears normal         Stomach:                Appears normal, left                                                                        sided  Cerebellum:            Appears normal         Abdomen:                Appears normal  Posterior Fossa:       Appears normal         Abdominal Wall:         Appears nml (cord                                                                        insert, abd wall)  Nuchal Fold:           Appears normal         Cord Vessels:           Appears normal (3                                                                        vessel cord)  Face:                   Appears normal         Kidneys:                Appear normal                         (orbits and profile)  Lips:                  Appears normal  Bladder:                Appears normal  Thoracic:              Appears normal         Spine:                  Not well visualized  Heart:                 Appears normal         Upper Extremities:      Appears normal                         (4CH, axis, and                         situs)  RVOT:                  Appears normal         Lower Extremities:      Appears normal  LVOT:                  Appears normal  Other:  Female gender Technically difficult due to fetal position. ---------------------------------------------------------------------- Cervix Uterus Adnexa  Cervix  Length:            3.2  cm.  Normal appearance by transabdominal scan.  Uterus  Right Rudimentary horn of the Uterus ---------------------------------------------------------------------- Impression  Normal interval growth.  No ultrasonic evidence of structural  fetal anomalies.  Suboptimal views of the fetal anatomy obtatined  NIPS pending  Suspected right rudimentary horn ---------------------------------------------------------------------- Recommendations  Follow up growth in 4 weeks  Assess uterine anatomy in the post natal period. ----------------------------------------------------------------------               Sander Nephew, MD Electronically Signed Final Report   12/22/2018 07:38 am ----------------------------------------------------------------------   Recent Labs: Lab Results  Component Value Date   WBC 7.2 12/28/2018   HGB 9.9 (L) 12/28/2018   PLT 304 12/28/2018   NA 136 12/28/2018   K 3.8 12/28/2018   CL 104 12/28/2018   CO2 23 12/28/2018   GLUCOSE 86 12/28/2018   BUN 7 12/28/2018   CREATININE 0.39 (L) 12/28/2018   BILITOT 0.3 12/28/2018   ALKPHOS 31 (L) 12/04/2018   AST 11 12/28/2018   ALT 10 12/28/2018   PROT 6.6 12/28/2018   ALBUMIN 4.0  12/04/2018   CALCIUM 8.9 12/28/2018   GFRAA 160 12/28/2018   QFTBGOLDPLUS NEGATIVE 12/28/2018    Speciality Comments: No specialty comments available.  Procedures:  No procedures performed Allergies: Patient has no known allergies.   Assessment / Plan:     Visit Diagnoses: Ankylosing spondylitis of lumbosacral region [facility]) - She has not had any recent flares.  She is clinically doing well on Cimzia 200 mg subcutaneous injections every 14 days.  She has some midline spinal tenderness in lumbar region but she has good range of motion.  She has no SI joint tenderness at this time.  She has no synovitis on exam.  She has no other joint pain or joint swelling at this time.  She will continue injecting Cimzia 200 mg every 14 days.  She does not need any refills at this time.  She is advised to notify us if she develops increased joint pain or joint swelling.  She will follow-up in the office in 3  months.  High risk medication use - Cimzia 200 mg every 14 days.  Last TB gold negative on 12/28/2018 and will monitor yearly.  Most recent CBC/CMP within normal limits except for anemia on 12/28/2018.  She is taking ferrous sulfate and prenatal vitamins.  Due for CBC/CMP in October and will monitor every 3 months.  Standing orders are in place.  She was advised to hold Cimzia if she develops signs or symptoms of an infection and to resume once the infection is cleared.    Sacroiliitis ([place]) - She has no SI joint tenderness on exam.   Dry eyes - Chronic   Language barrier -Interpreter was present.   Hair loss  Orders: No orders of the defined types were placed in this encounter.  No orders of the defined types were placed in this encounter.   Follow-Up Instructions: Return in about 3 months (around 04/06/2019) for Ankylosing Spondylitis.   Ofilia Neas, PA-C  Note - This record has been created using Dragon software.  Chart creation errors have been sought, but may not always  have been located.  Such creation errors do not reflect on  the standard of medical care.

## 2018-12-31 NOTE — Telephone Encounter (Signed)
-----   Message from Ofilia Neas, PA-C sent at 12/30/2018  4:52 PM EDT ----- TB gold negative.

## 2018-12-31 NOTE — Telephone Encounter (Signed)
Spoke to patient w/ Arabic interpreter ID # [id number] about her appointment on 7/10 @ 8:15. Patient instructed that the appointment is a virtual visit through [employer]. Patient instructed to download the app. Patient verbalized that she is having issues downloading the app and will keep trying until she can get it downloaded even if she has to change the format on the phone. Patient instructed to give the office a call in the morning if she can get the app downloaded.

## 2019-01-01 ENCOUNTER — Ambulatory Visit (INDEPENDENT_AMBULATORY_CARE_PROVIDER_SITE_OTHER): Payer: BC Managed Care – PPO | Admitting: Women's Health

## 2019-01-01 ENCOUNTER — Other Ambulatory Visit: Payer: Self-pay

## 2019-01-01 ENCOUNTER — Encounter: Payer: Self-pay | Admitting: Women's Health

## 2019-01-01 VITALS — BP 103/63 | HR 95 | Wt 185.0 lb

## 2019-01-01 DIAGNOSIS — Z3A21 21 weeks gestation of pregnancy: Secondary | ICD-10-CM

## 2019-01-01 DIAGNOSIS — Z98891 History of uterine scar from previous surgery: Secondary | ICD-10-CM

## 2019-01-01 DIAGNOSIS — O26892 Other specified pregnancy related conditions, second trimester: Secondary | ICD-10-CM

## 2019-01-01 DIAGNOSIS — O99012 Anemia complicating pregnancy, second trimester: Secondary | ICD-10-CM

## 2019-01-01 DIAGNOSIS — Q51818 Other congenital malformations of uterus: Secondary | ICD-10-CM | POA: Insufficient documentation

## 2019-01-01 DIAGNOSIS — Z3480 Encounter for supervision of other normal pregnancy, unspecified trimester: Secondary | ICD-10-CM

## 2019-01-01 DIAGNOSIS — Q519 Congenital malformation of uterus and cervix, unspecified: Secondary | ICD-10-CM

## 2019-01-01 NOTE — Progress Notes (Signed)
TELEHEALTH VIRTUAL OBSTETRICS VISIT ENCOUNTER NOTE  I connected with Kathleen Nguyen on 01/01/19 at  8:15 AM EDT by telephone at home and verified that I am speaking with the correct person using two identifiers.   I discussed the limitations, risks, security and privacy concerns of performing an evaluation and management service by telephone and the availability of in person appointments. I also discussed with the patient that there may be a patient responsible charge related to this service. The patient expressed understanding and agreed to proceed.  Subjective:  Kathleen Nguyen is a 33 y.o. [id number] at [redacted]w[redacted]d being followed for ongoing prenatal care.  She is currently monitored for the following issues for this high-risk pregnancy and has Pelvic pain; Tinea pedis of left foot; Uterine adenomyoma; Supervision of other normal pregnancy, antepartum; Ankylosing spondylitis ([place]); Language barrier; History of cesarean delivery; Anemia in pregnancy; and Rudimentary uterine horn on their problem list.  Patient reports no complaints. Reports fetal movement. Denies any contractions, bleeding or leaking of fluid.   The following portions of the patient's history were reviewed and updated as appropriate: allergies, current medications, past family history, past medical history, past social history, past surgical history and problem list.   Pt is currently taking 400mg  Fe BID. Pt requests to discuss [place] results and bloodwork for 12/28/2018. Pt requesting to discuss delivery plan and whether or not she will be able to deliver vaginally. Pt has hx of C/S x2. Pt also wants to know the name of her doctor and how to reach them if she has a problem. Pt states she often tries to reach someone at the clinic and is either on hold for a long time, or does not receive a phone call in return.  Arabic translation services used for the entire visit.  Objective:   General:  Alert, oriented and cooperative.   Mental  Status: Normal mood and affect perceived. Normal judgment and thought content.  Rest of physical exam deferred due to type of encounter  Assessment and Plan:  Pregnancy: [id number] at [redacted]w[redacted]d There are no diagnoses linked to this encounter.  1. Supervision of other normal pregnancy, antepartum -reviewed anatomy US results, confirmed gender, and reviewed bloodwork results from 12/28/2018 -pt reminded of f/u US on 01/18/2019, pt aware -pt given clinic phone number and advised to come to [facility]U with any OB-related emergencies -discussed [facility] practice policies and advised pt that no pt sees just one provider -pt unsure of pediatrician, will give printed list at next in-person visit  2. Rudimentary uterine horn -discussed with patient  3. History of cesarean delivery -pt advised will need repeat C/S for delivery  4. Anemia during pregnancy in second trimester -discussed anemia in pregnancy and oral vs. IV Fe treatment -discussed taking iron with VitC and foods to avoid taking within 2 hours of iron adminitration and other ways to improve iron absorption -anemia panel to be performed with 28wk labs  Preterm labor symptoms and general obstetric precautions including but not limited to vaginal bleeding, contractions, leaking of fluid and fetal movement were reviewed in detail with the patient.  I discussed the assessment and treatment plan with the patient. The patient was provided an opportunity to ask questions and all were answered. The patient agreed with the plan and demonstrated an understanding of the instructions. The patient was advised to call back or seek an in-person office evaluation/go to MAU at [facility] for any urgent or concerning symptoms. Please refer to After Visit Summary for  other counseling recommendations.   I provided 31 minutes of non-face-to-face time during this encounter.  Return in about 4 weeks (around 01/29/2019) for virtual visit - discuss IUDs.   Future Appointments  Date Time Provider [place]  01/04/2019 10:20 AM Ofilia Neas, PA-C [facility] None  01/18/2019 10:30 AM [facility] NURSE [place] [facility]  01/18/2019 10:30 AM [place] 1 [facility]    Clarisa Fling, NP [facility] [employer], [place]

## 2019-01-01 NOTE — Patient Instructions (Addendum)
Iron-Rich Diet  Iron is a mineral that helps your body to produce hemoglobin. Hemoglobin is a protein in red blood cells that carries oxygen to your body's tissues. Eating too little iron may cause you to feel weak and tired, and it can increase your risk of infection. Iron is naturally found in many foods, and many foods have iron added to them (iron-fortified foods). You may need to follow an iron-rich diet if you do not have enough iron in your body due to certain medical conditions. The amount of iron that you need each day depends on your age, your sex, and any medical conditions you have. Follow instructions from your health care provider or a diet and nutrition specialist (dietitian) about how much iron you should eat each day. What are tips for following this plan? Reading food labels  Check food labels to see how many milligrams (mg) of iron are in each serving. Cooking  Cook foods in pots and pans that are made from iron.  Take these steps to make it easier for your body to absorb iron from certain foods: ? Soak beans overnight before cooking. ? Soak whole grains overnight and drain them before using. ? Ferment flours before baking, such as by using yeast in bread dough. Meal planning  When you eat foods that contain iron, you should eat them with foods that are high in vitamin C. These include oranges, peppers, tomatoes, potatoes, and mango. Vitamin C helps your body to absorb iron. General information  Take iron supplements only as told by your health care provider. An overdose of iron can be life-threatening. If you were prescribed iron supplements, take them with orange juice or a vitamin C supplement.  When you eat iron-fortified foods or take an iron supplement, you should also eat foods that naturally contain iron, such as meat, poultry, and fish. Eating naturally iron-rich foods helps your body to absorb the iron that is added to other foods or contained in a supplement.   Certain foods and drinks prevent your body from absorbing iron properly. Avoid eating these foods in the same meal as iron-rich foods or with iron supplements. These foods include: ? Coffee, black tea, and red wine. ? Milk, dairy products, and foods that are high in calcium. ? Beans and soybeans. ? Whole grains. What foods should I eat? Fruits Prunes. Raisins. Eat fruits high in vitamin C, such as oranges, grapefruits, and strawberries, alongside iron-rich foods. Vegetables Spinach (cooked). Green peas. Broccoli. Fermented vegetables. Eat vegetables high in vitamin C, such as leafy greens, potatoes, bell peppers, and tomatoes, alongside iron-rich foods. Grains Iron-fortified breakfast cereal. Iron-fortified whole-wheat bread. Enriched rice. Sprouted grains. Meats and other proteins Beef liver. Oysters. Beef. Shrimp. [place]. Chicken. [place]. Sardines. Chickpeas. Nuts. Tofu. Pumpkin seeds. Beverages Tomato juice. Fresh orange juice. Prune juice. Hibiscus tea. Fortified instant breakfast shakes. Sweets and desserts Blackstrap molasses. Seasonings and condiments Tahini. Fermented soy sauce. Other foods Wheat germ. The items listed above may not be a complete list of recommended foods and beverages. Contact a dietitian for more information. What foods should I avoid? Grains Whole grains. Bran cereal. Bran flour. Oats. Meats and other proteins Soybeans. Products made from soy protein. Black beans. Lentils. Mung beans. Split peas. Dairy Milk. Cream. Cheese. Yogurt. Cottage cheese. Beverages Coffee. Black tea. Red wine. Sweets and desserts Cocoa. Chocolate. Ice cream. Other foods Basil. Oregano. Large amounts of parsley. The items listed above may not be a complete list of foods and beverages to avoid.  Contact a dietitian for more information. Summary  Iron is a mineral that helps your body to produce hemoglobin. Hemoglobin is a protein in red blood cells that carries oxygen to your  body's tissues.  Iron is naturally found in many foods, and many foods have iron added to them (iron-fortified foods).  When you eat foods that contain iron, you should eat them with foods that are high in vitamin C. Vitamin C helps your body to absorb iron.  Certain foods and drinks prevent your body from absorbing iron properly, such as whole grains and dairy products. You should avoid eating these foods in the same meal as iron-rich foods or with iron supplements. This information is not intended to replace advice given to you by your health care provider. Make sure you discuss any questions you have with your health care provider. Document Released: 01/22/2005 Document Revised: 05/23/2017 Document Reviewed: 05/06/2017 Elsevier Patient Education  [place] [facility]ACT[facility] PH[facility]ICIANS  [facility] [address] [place], [place] [address] Phone - [phone]   Fax - [place] [address] [place], [place]  [address] Phone - [phone]   Fax - [phone]  [place]. [address] [place] [address] [place], [place]  [address] Phone - [phone]   Fax - [phone]  [facility] [address] [place], [place]  [address] Phone - [phone]   Fax - [phone]  [place] [address] [place], [place]  [address] Phone - [phone]   Fax - [place] [address] [place], [place]  [address] Phone - [phone]   Fax - [place] [facility] [place] [address], [place], [place]  [address] Phone - [phone]   Fax - [phone]  [place] [address] [place], [place], [place]  [address] Phone - [phone]   Fax - [place] [address] [place], [place]  [address] Phone -  [phone]   Fax - [phone] [facility] [place]. [address] [place], [place]  [address] Phone - [phone]   Fax - [phone]  [place] [address] [place], [place]  [address] Phone - [phone]   Fax - [phone]  [facility] [place], [place], [place]  [address] Phone - [phone]   Fax - [place] [address], [place], [place]  [address] Phone - [phone]   Fax - [phone]  [facility] [address] [place], [place]  [address] Phone - [place], [place]  [address] Phone - [phone]   Fax - [place] [address] [place], [place]  [address] Phone - [phone]   Fax - [phone]  [place] [address] [place], [place]  [address] Phone - [phone]   Fax - [place]. [place], [place]  [address] Phone - [phone]   Fax - [place], [place], [place]  [address] Phone - [phone]   Fax - [place] [address], [place], [place]  [address] Phone - [phone]   Fax - [phone]  [place] [address], [place], [place]  [address] Phone - [phone]   Fax - [place] [address], [place], [place]  [address] Phone - [phone]   Fax - [phone]  [place] [address] [place], [place]  [address] Phone - [phone]   Fax - [phone] [place] [address] [place], [place]  [address] Phone - [phone]   Fax - [place] [address] [place], [place]  [address] Phone - [phone]   Fax -  [place] [address] [place], [place]  [address] Phone - [phone]   Fax - [phone]

## 2019-01-01 NOTE — Progress Notes (Signed)
I connected with  Kathleen Nguyen on 01/01/19 at  8:15 AM EDT by telephone and verified that I am speaking with the correct person using two identifiers.   I discussed the limitations, risks, security and privacy concerns of performing an evaluation and management service by telephone and the availability of in person appointments. I also discussed with the patient that there may be a patient responsible charge related to this service. The patient expressed understanding and agreed to proceed. C/o moderate headache once a month ago,none since.  Had patient take bp while completing assessment.States she has been taking her blood pressure occasionally and it has been 101/62-102/68.  Instructed to take blood pressure weekly and record at home, then report at next visit. Also to take bp and weight before each virtual visit. She voices understanding. Patient unable to connect virtually- transferred to provider by phone.  Linda,RN 01/01/2019  8:21 AM

## 2019-01-04 ENCOUNTER — Ambulatory Visit (INDEPENDENT_AMBULATORY_CARE_PROVIDER_SITE_OTHER): Payer: BC Managed Care – PPO | Admitting: Physician Assistant

## 2019-01-04 ENCOUNTER — Encounter: Payer: Self-pay | Admitting: Physician Assistant

## 2019-01-04 ENCOUNTER — Other Ambulatory Visit: Payer: Self-pay

## 2019-01-04 VITALS — BP 93/65 | HR 98 | Resp 12 | Ht 65.0 in | Wt 178.0 lb

## 2019-01-04 DIAGNOSIS — M457 Ankylosing spondylitis of lumbosacral region: Secondary | ICD-10-CM

## 2019-01-04 DIAGNOSIS — H04123 Dry eye syndrome of bilateral lacrimal glands: Secondary | ICD-10-CM | POA: Diagnosis not present

## 2019-01-04 DIAGNOSIS — Z758 Other problems related to medical facilities and other health care: Secondary | ICD-10-CM

## 2019-01-04 DIAGNOSIS — Z79899 Other long term (current) drug therapy: Secondary | ICD-10-CM | POA: Diagnosis not present

## 2019-01-04 DIAGNOSIS — L659 Nonscarring hair loss, unspecified: Secondary | ICD-10-CM

## 2019-01-04 DIAGNOSIS — M461 Sacroiliitis, not elsewhere classified: Secondary | ICD-10-CM | POA: Diagnosis not present

## 2019-01-04 DIAGNOSIS — Z789 Other specified health status: Secondary | ICD-10-CM

## 2019-01-18 ENCOUNTER — Ambulatory Visit (HOSPITAL_COMMUNITY)
Admission: RE | Admit: 2019-01-18 | Discharge: 2019-01-18 | Disposition: A | Discharge status: Home / Self Care | Payer: BC Managed Care – PPO | Source: Ambulatory Visit | Attending: Obstetrics and Gynecology | Admitting: Obstetrics and Gynecology

## 2019-01-18 ENCOUNTER — Encounter (HOSPITAL_COMMUNITY): Payer: Self-pay

## 2019-01-18 ENCOUNTER — Ambulatory Visit (HOSPITAL_COMMUNITY): Payer: BC Managed Care – PPO | Admitting: *Deleted

## 2019-01-18 ENCOUNTER — Other Ambulatory Visit: Payer: Self-pay

## 2019-01-18 DIAGNOSIS — Z98891 History of uterine scar from previous surgery: Secondary | ICD-10-CM | POA: Diagnosis not present

## 2019-01-18 DIAGNOSIS — Z3480 Encounter for supervision of other normal pregnancy, unspecified trimester: Secondary | ICD-10-CM | POA: Insufficient documentation

## 2019-01-18 DIAGNOSIS — Q519 Congenital malformation of uterus and cervix, unspecified: Secondary | ICD-10-CM | POA: Insufficient documentation

## 2019-01-18 DIAGNOSIS — Z3A24 24 weeks gestation of pregnancy: Secondary | ICD-10-CM

## 2019-01-18 DIAGNOSIS — Z789 Other specified health status: Secondary | ICD-10-CM

## 2019-01-18 DIAGNOSIS — O99012 Anemia complicating pregnancy, second trimester: Secondary | ICD-10-CM | POA: Insufficient documentation

## 2019-01-18 DIAGNOSIS — O34219 Maternal care for unspecified type scar from previous cesarean delivery: Secondary | ICD-10-CM | POA: Diagnosis not present

## 2019-01-18 DIAGNOSIS — Z362 Encounter for other antenatal screening follow-up: Secondary | ICD-10-CM | POA: Insufficient documentation

## 2019-01-18 DIAGNOSIS — Q51818 Other congenital malformations of uterus: Secondary | ICD-10-CM

## 2019-01-29 ENCOUNTER — Other Ambulatory Visit: Payer: Self-pay

## 2019-01-29 ENCOUNTER — Ambulatory Visit (INDEPENDENT_AMBULATORY_CARE_PROVIDER_SITE_OTHER): Payer: BC Managed Care – PPO | Admitting: Medical

## 2019-01-29 ENCOUNTER — Encounter: Payer: Self-pay | Admitting: Medical

## 2019-01-29 VITALS — BP 111/74 | HR 101 | Wt 189.0 lb

## 2019-01-29 DIAGNOSIS — Q51818 Other congenital malformations of uterus: Secondary | ICD-10-CM

## 2019-01-29 DIAGNOSIS — Z98891 History of uterine scar from previous surgery: Secondary | ICD-10-CM

## 2019-01-29 DIAGNOSIS — M459 Ankylosing spondylitis of unspecified sites in spine: Secondary | ICD-10-CM

## 2019-01-29 DIAGNOSIS — O99012 Anemia complicating pregnancy, second trimester: Secondary | ICD-10-CM

## 2019-01-29 DIAGNOSIS — Q519 Congenital malformation of uterus and cervix, unspecified: Secondary | ICD-10-CM

## 2019-01-29 DIAGNOSIS — Z3480 Encounter for supervision of other normal pregnancy, unspecified trimester: Secondary | ICD-10-CM

## 2019-01-29 DIAGNOSIS — Z3A25 25 weeks gestation of pregnancy: Secondary | ICD-10-CM

## 2019-01-29 DIAGNOSIS — O26892 Other specified pregnancy related conditions, second trimester: Secondary | ICD-10-CM

## 2019-01-29 NOTE — Progress Notes (Signed)
   PRENATAL VISIT NOTE  Subjective:  Kathleen Nguyen is a 33 y.o. [id number] at [redacted]w[redacted]d being seen today for ongoing prenatal care.  She is currently monitored for the following issues for this high-risk pregnancy and has Tinea pedis of left foot; Uterine adenomyoma; Supervision of other normal pregnancy, antepartum; Ankylosing spondylitis ([place]); Language barrier; History of cesarean delivery; Anemia in pregnancy; and Rudimentary uterine horn on their problem list.  Patient reports mild peripheral edema.  Contractions: Not present. Vag. Bleeding: None.  Movement: Present. Denies leaking of fluid.   The following portions of the patient's history were reviewed and updated as appropriate: allergies, current medications, past family history, past medical history, past social history, past surgical history and problem list.   Objective:   Vitals:   01/29/19 0938  BP: 111/74  Pulse: (!) 101  Weight: 189 lb (85.7 kg)    Fetal Status: Fetal Heart Rate (bpm): 154 Fundal Height: 25 cm Movement: Present     General:  Alert, oriented and cooperative. Patient is in no acute distress.  Skin: Skin is warm and dry. No rash noted.   Cardiovascular: Normal heart rate noted  Respiratory: Normal respiratory effort, no problems with respiration noted  Abdomen: Soft, gravid, appropriate for gestational age.  Pain/Pressure: Absent     Pelvic: Cervical exam deferred        Extremities: Normal range of motion.  Edema: Trace  Mental Status: Normal mood and affect. Normal behavior. Normal judgment and thought content.   Assessment and Plan:  Pregnancy: [id number] at [redacted]w[redacted]d 1. Supervision of other normal pregnancy, antepartum - Doing well  2. History of cesarean delivery - x2, does not want to repeat C/S - patient advised she may not be given the option due to her history, will discuss with MD at next visit and sign consent  3. Ankylosing spondylitis, unspecified site of spine ([place])  4. Anemia during pregnancy in  second trimester - Recheck CBC with anemia panel at next visit   5. Rudimentary uterine horn  Preterm labor symptoms and general obstetric precautions including but not limited to vaginal bleeding, contractions, leaking of fluid and fetal movement were reviewed in detail with the patient. Please refer to After Visit Summary for other counseling recommendations.   Return in about 3 weeks (around 02/19/2019) for [facility], 28 week labs (fasting), In-Person with MD.  Future Appointments  Date Time Provider [place]  04/05/2019 10:20 AM Ofilia Neas, PA-C [facility] None    Kerry Hough, PA-C

## 2019-01-29 NOTE — Patient Instructions (Addendum)
AREA PEDIATRIC/FAMILY PRACTICE PHYSICIANS  [place]outheast [place] ([address]) . [facility] o Chambliss, MD; Eniola, MD; Hale, MD; Hensel, MD; McDiarmid, MD; McIntyer, MD; Neal, MD; Walden, MD o [address] [place], [place] [address] o [phone] o Mon-Fri 8:30-12:30, 1:30-5:00 o Providers come to see babies at [facility] o Accepting Medicaid . [facility] o Limited providers who accept newborns: Koirala, MD; Morrow, MD; Wolters, MD o [address], [place], [place] [address] o [phone] o Mon-Fri 8:00-5:30 o Babies seen by providers at [facility] o Does NOT accept Medicaid o Please call early in hospitalization for appointment (limited availability)  . [facility] o Mulberry, MD o [address] [place], [place] [address] o [phone] o Mon, Tue, Thur, Fri 8:30-5:00, Wed 10:00-7:00 (closed 1-2pm) o Babies seen by [facility] providers o Accepting Medicaid . [facility] - Pediatrician o Rubin, MD o [address], [place], [place] [address] o [phone] o Mon-Fri 8:30-5:00, Sat 8:30-12:00 o Provider comes to see babies at [facility] o Accepting Medicaid o Must have been referred from current patients or contacted office prior to delivery . [facility] ([facility]) o Brown, MD; Chandler, MD; Ettefagh, MD; Grant, MD; Lester, MD; McCormick, MD; McQueen, MD; Prose, MD; Simha, MD; Stanley, MD; Stryffeler, NP; Tebben, NP o [address], [place], [place] [address] o [phone] o Mon, Tue, Thur, Fri 8:30-5:30, Wed 9:30-5:30, Sat 8:30-12:30 o Babies seen by [facility] providers o Accepting Medicaid o Only accepting infants of first-time parents or siblings of current patients o Hospital discharge coordinator will make follow-up appointment . Jack Amos o [address],  [place], [place]  [address] o [phone]   Fax - [phone] . [facility] o [address], [place], [place]  [address] o Phone - [phone]   Fax - [phone] . Shilpa Gosrani o [address], [place], [place]  [address] o [phone]  [place]ortheast [place] ([address]) . [facility] o Bates, MD; Brassfield, MD; Cooper, Cox, MD; MD; Davis, MD; Dovico, MD; Ettefaugh, MD; Little, MD; Lowe, MD; Keiffer, MD; Melvin, MD; Sumner, MD; Williams, MD o [address], [place], [place] [address] o [phone] o Mon-Fri 8:30-5:00 (extended evenings Mon-Thur as needed), Sat-Sun 10:00-1:00 o Providers come to see babies at [facility] o Accepting Medicaid for families of first-time babies and families with all children in the household age 3 and under. Must register with office prior to making appointment (M-F only). . [facility] o Henson, NP; Knapp, MD; Lalonde, MD; Tysinger, PA o [address] [place], [place] [address] o [phone] o Mon-Fri 8:00-5:00 o Babies seen by providers at [facility] o Does NOT accept Medicaid/Commercial Insurance Only . [facility] ([facility])  o Artis, MD; Barnes, MD; Bratton, MD; Coccaro, MD; Lockett Gardner, MD; Kramer, MD; Marshall, MD; Netherton, MD; Poleto, MD; Skinner, MD o [address] [place], [place] [address] o [phone] o Mon-Fri 8:30-5:30, Sat (Oct.-Mar.) 9:00-1:00 o Babies seen by providers at [facility] o Accepting Medicaid  [place] ([address]) . [facility] o Reid, MD; Warner, MD o [address], [place],  [address] o [phone] o Mon-Fri 8:30-5:00, Sat 8:30-12:00 o Providers come to see babies at [facility] o Does NOT accept Medicaid . [facility] o Becker, PA; Hagler, MD; Scifres, PA; Sun, MD; Swayne, MD o [address],  [place], [place] [address] o [phone] o Mon-Fri 8:00-5:00 o Babies seen by providers at [facility] o Does NOT accept Medicaid o Only accepting babies of parents who are patients o Please call early in hospitalization for appointment (limited availability) . [facility] o Clark, MD; Frye, MD; Kelleher, MD; Mack, NP; Miller, MD; O'Keller, MD; Patterson, NP; Pudlo, MD; Puzio, MD; Thomas, MD; Tucker, MD; Twiselton, MD o [address], [place], [place] [address] o [phone] o Mon-Fri 8:00-5:00, Sat 9:00-12:00 o Providers come to see babies at [facility] o Does NOT accept Medicaid  [place] ([address]) . [facility] o Limited providers accepting new patients: Brake, NP; Wharton, PA o [address], [place], [place] [address] o [phone] o Mon-Fri 8:00-5:00 o Babies seen by providers at [facility] o Does NOT accept Medicaid o Only accepting babies of parents who are patients o Please call early in hospitalization for appointment (limited availability) . [facility] o Gay, MD; Quinlan, MD o [address] [place], [place] [address] o [phone] (press 1 to schedule appointment) o Mon-Fri 8:00-5:00 o Providers come to see babies at [facility] o Does NOT accept Medicaid . [facility] o Mazer, MD o [address] [place], [place] [address] o [phone] o Mon-Fri 8:30-5:00 (lunch 12:30-1:00), extended hours by appointment only Wed 5:00-6:30 o Babies seen by [facility] providers o Accepting Medicaid . [facility] o Banks, MD; Jordan, MD; Koberlein, MD o [address], [place], [place] [address] o [phone] o Mon-Fri 8:00-5:00 o Babies seen by [facility] providers o Does NOT accept Medicaid . [facility] o Parker, MD; Hunter, MD; Wallace, DO o [address] [place], [place]  [address] o [phone] o Mon-Fri 8:00-5:00 o Babies seen by [facility] providers o Does NOT accept Medicaid . [facility] o Brandon, PA; Brecken, PA; Christy, NP; Dees, MD; DeClaire, MD; DeWeese, MD; Hansen, NP; Mills, NP; Parrish, NP; Smoot, NP; Summer, MD; Vapne, MD o [address] [place], [place] [address] o [phone] o Mon-Fri 8:30-5:00, Sat 10:00-1:00 o Providers come to see babies at [facility] o Does NOT accept Medicaid o Free prenatal information session Tuesdays at 4:45pm . [facility] o Bouska, MD; [name], PA; Jeffery, PA; Weber, PA o [address] [place] [address] o [phone] o Mon-Fri 7:30-5:30 o Babies seen by [facility] providers . [facility] o [address], [place], [place]  [address] o [phone]   Fax - [phone]  [place] ([address] & [address]) . [facility] o Reese, MD o [address] [place], [place] [address] o [phone] o Mon-Thur 8:00-6:00 o Providers come to see babies at [facility] o Accepting Medicaid . [facility] o Anderson, NP; Badger, MD; Beal, PA; Spencer, PA o [address] [place], [place] [address] o [phone] o Mon-Thur 7:30-7:30, Fri 7:30-4:30 o Babies seen by [facility] providers o Accepting Medicaid . [facility] o Agbuya, MD; Klett, NP; Romgoolam, MD o [address], [place],  [address] o [phone] o Mon-Fri 8:30-5:00, Sat 8:30-12:00 o Providers come to see babies at [facility] o Accepting Medicaid o Must have "Meet & Greet" appointment at office prior to delivery . [facility] ) o McCord,   MD; Juleen China, MD; Clydene Laming, [address], [place], [place] [address] o [phone] o Mon-Wed 8:00-6:00, Thur-Fri 8:00-5:00, Sat 9:00-12:00 o Providers come to  see babies at [facility] o Does NOT accept Medicaid o Only accepting siblings of current patients  [facility]  o [address], [place], [place], [place]  [address] o [phone]   Fax - [phone]  [place] [facility] o [id number] [place], [place]  [address] o [phone]   Fax - [place] [phone] & [phone])  Therapist, music at [place], [place]; [place], [address] [place], [place] [address] o [phone] o Mon-Fri 7:00-5:00 o Babies seen by [facility] providers o Does NOT accept Medicaid  [place], MD; [place], [place]; [place], [place] [address], [place], [place] [address] o [phone] o Mon-Fri 8:00-5:00 o Babies seen by [facility] providers o Accepting Medicaid  [place], MD; [place], [place]; [place], NP; [place], [place], [place], [place] [address] o [phone] o Mon-Fri 8:00-5:00 o Babies seen by providers at [place] [phone])  [facility] [place], [place] o [address] [place], [place] [address] o [phone] o Mon-Fri 8:00-5:00 o Babies seen by [facility] providers o Does NOT accept Medicaid o Limited availability, please call early in hospitalization to schedule follow-up  [facility] Leilani Merl, [place]; Maisie Fus, MD; Charlesetta Garibaldi, MD; [place], [place]; Jeannine Kitten, MD; [place], [place] [facility] [address], [place], [place] [address] o [phone] o Mon-Fri 8:30-5:00, Sat 9:00-12:00 o Babies seen by providers at [place] Medicaid o Please register online then schedule online or call office o [email or web address] ([place] [facility]  [place]) Kristian Covey, NP; Dwyane Dee, MD; Leonidas Romberg, PA o [address], [place], [place] [address] o [phone] o Mon-Fri 8:00-5:00 o Babies seen by providers at [place] Medicaid  [place] ([facility] [employer]) Dairl Ponder, MD; Rayvon Char, NP; Melina Modena, MD o [address] [place], [place], [place] [address] o [phone] o Mon-Fri 8:00-5:30, Sat&Sun by appointment (phones open at 8:30) o Babies seen by [facility] providers o Accepting Medicaid o Must be a first-time baby or sibling of current patient  [place], [address], [place], [place]  [address] o [phone]   Fax - [phone]  [place] [phone] & [phone])  [place], [place]; [place], [place]; [place], MD; [place], [place]; Harrell Lark, MD o [address] [place], [place] [address] o [phone] o Mon-Thur 8:00-7:00, Fri 8:00-5:00, Sat 8:00-12:00, Sun 9:00-12:00 o Babies seen by [facility] providers o Accepting Medicaid  [facility], MD; Ruthann Cancer, MD; [facility], MD o [address], [place], [place] [address] o [phone] o Mon-Thur 8:00-5:00 o Babies seen by providers at [place] Medicaid  [facility] [place], MD; Coe[place]Goins, MD; Amedeo Plenty, MD; Bobby Rumpf, MD; List, MD; Lavonia Drafts, MD; Ruthann Cancer, MD; Selinda Eon, MD; Audie Box, MD; Jim Like, MD; Christie Nottingham, MD; Hubbard Hartshorn, MD; [place] Nunnery, MD o [address] [place], [place]  [address] o [phone] o Mon-Fri 8:00-5:30, Sat (Oct.-Mar.) 9:00-1:00 o Babies seen by providers at [facility] o Accepting Medicaid o Must fill out new patient packet, available online at [email or web address]  [place] ([facility]) Barnabas Lister, NP; Kenton Kingfisher, NP; Claiborne Billings, NP; Rolla Plate, MD;  [place], [place]; Carola Rhine, MD; Tyron Russell, MD; Delia Chimes, NP o [address], [place], [place] [address] o [phone] o Mon-Thur 8:00-5:30, Fri 8:00-5:00 o Babies seen by providers at [place] [phone])  [place], [place]; [place], MD; [place], MD; [place], [place] o [address] [place], [place] [address] o [phone] o Mon-Fri 8:00-5:00 o Babies seen by providers at [place] [phone])  [place] at [place], [place]; Olen Pel, MD; [place], [place], [place], [place] [address] o [phone] o Mon-Fri 8:00-5:00 o Babies seen by providers at [facility] o Does NOT accept Medicaid o Limited appointment availability, please call early in hospitalization   [place] at [place], [place]; [place], [place] [address], [place], [place] [address] o [phone] o Mon-Fri 8:00-5:00 o Babies seen by [facility] providers o Does NOT accept [facility] Su [place], MD; Guy Sandifer, MD; [place], [place]; [place], MD o [address], [place], [place] [address] o [phone] o Mon-Fri 8:00-5:00 o After hours clinic [facility] [place], [place] [address]) [phone] Mon-Fri 5:00-8:00, Sat 12:00-6:00, Sun 10:00-4:00 o Babies seen by [facility] providers o Accepting Medicaid  [place] at [facility] o [address], [place], [place]  [address] o [phone]   Fax - [phone]  [place] [phone])  [place] at [place], MD o [address] [place], [place], [place] [address] o [phone] o Mon-Fri 8:00-5:00 o Babies seen by [facility] providers o Does NOT accept Medicaid  [place] ([place] [facility] [place], MD o [address] [place], [place], [place]  [address] o [phone] o Mon-Thur 8:00-7:00, Fri 8:00-5:00, Sat 8:00-12:00 o Babies seen by providers at [facility] o Accepting Medicaid - but does not have vaccinations in office (must be received elsewhere) o Limited availability, please call early in hospitalization  [place] [phone])  [place], MD o [address], [place] [address] o [phone]  Fax [phone]  Levonorgestrel intrauterine device (IUD) What is this medicine? LEVONORGESTREL IUD (LEE voe nor jes trel) is a contraceptive (birth control) device. The device is placed inside the uterus by a healthcare professional. It is used to prevent pregnancy. This device can also be used to treat heavy bleeding that occurs during your period. This medicine may be used for other purposes; ask your health care provider or pharmacist if you have questions. COMMON BRAND NAME(S): Minette [place] What should I tell my health care provider before I take this medicine? They need to know if you have any of these conditions:  abnormal Pap smear  cancer of the breast, uterus, or cervix  diabetes  endometritis  genital or pelvic infection now or in the past  have more than one sexual partner or your partner has more than one partner  heart disease  history of an ectopic or tubal pregnancy  immune system problems  IUD in  place  liver disease or tumor  problems with blood clots or take blood-thinners  seizures  use intravenous drugs  uterus of unusual shape  vaginal bleeding that has not been explained  an unusual or allergic reaction to levonorgestrel, other hormones, silicone, or polyethylene, medicines, foods, dyes, or preservatives  pregnant or trying to get pregnant  breast-feeding How should I use this medicine? This device is placed inside the uterus by a health care professional. Talk to your pediatrician regarding the use of this medicine  in children. Special care may be needed. Overdosage: If you think you have taken too much of this medicine contact a poison control center or emergency room at once. NOTE: This medicine is only for you. Do not share this medicine with others. What if I miss a dose? This does not apply. Depending on the brand of device you have inserted, the device will need to be replaced every 3 to 6 years if you wish to continue using this type of birth control. What may interact with this medicine? Do not take this medicine with any of the following medications:  amprenavir  bosentan  fosamprenavir This medicine may also interact with the following medications:  aprepitant  armodafinil  barbiturate medicines for inducing sleep or treating seizures  bexarotene  boceprevir  griseofulvin  medicines to treat seizures like carbamazepine, ethotoin, felbamate, oxcarbazepine, phenytoin, topiramate  modafinil  pioglitazone  rifabutin  rifampin  rifapentine  some medicines to treat HIV infection like atazanavir, efavirenz, indinavir, lopinavir, nelfinavir, tipranavir, ritonavir  St. John's wort  warfarin This list may not describe all possible interactions. Give your health care provider a list of all the medicines, herbs, non-prescription drugs, or dietary supplements you use. Also tell them if you smoke, drink alcohol, or use illegal drugs. Some items may interact with your medicine. What should I watch for while using this medicine? Visit your doctor or health care professional for regular check ups. See your doctor if you or your partner has sexual contact with others, becomes HIV positive, or gets a sexual transmitted disease. This product does not protect you against HIV infection (AIDS) or other sexually transmitted diseases. You can check the placement of the IUD yourself by reaching up to the top of your vagina with clean fingers to feel the threads. Do not pull on the threads. It is  a good habit to check placement after each menstrual period. Call your doctor right away if you feel more of the IUD than just the threads or if you cannot feel the threads at all. The IUD may come out by itself. You may become pregnant if the device comes out. If you notice that the IUD has come out use a backup birth control method like condoms and call your health care provider. Using tampons will not change the position of the IUD and are okay to use during your period. This IUD can be safely scanned with magnetic resonance imaging (MRI) only under specific conditions. Before you have an MRI, tell your healthcare provider that you have an IUD in place, and which type of IUD you have in place. What side effects may I notice from receiving this medicine? Side effects that you should report to your doctor or health care professional as soon as possible:  allergic reactions like skin rash, itching or hives, swelling of the face, lips, or tongue  fever, flu-like symptoms  genital sores  high blood pressure  no menstrual period for 6 weeks during use  pain, swelling, warmth in the leg  pelvic pain or tenderness  severe or sudden headache  signs of pregnancy  stomach cramping  sudden shortness of breath  trouble with balance, talking, or walking  unusual vaginal bleeding, discharge  yellowing of the eyes or skin Side effects that usually do not require medical attention (report to your doctor or health care professional if they continue or are bothersome):  acne  breast pain  change in sex drive or performance  changes in weight  cramping, dizziness, or faintness while the device is being inserted  headache  irregular menstrual bleeding within first 3 to 6 months of use  nausea This list may not describe all possible side effects. Call your doctor for medical advice about side effects. You may report side effects to FDA at 1-800-FDA-1088. Where should I keep my  medicine? This does not apply. NOTE: This sheet is a summary. It may not cover all possible information. If you have questions about this medicine, talk to your doctor, pharmacist, or health care provider.  2020 Elsevier/Gold Standard (2018-04-21 13:22:01)  Second Trimester of Pregnancy  The second trimester is from week 14 through week 27 (month 4 through 6). This is often the time in pregnancy that you feel your best. Often times, morning sickness has lessened or quit. You may have more energy, and you may get hungry more often. Your unborn baby is growing rapidly. At the end of the sixth month, he or she is about 9 inches long and weighs about 1 pounds. You will likely feel the baby move between 18 and 20 weeks of pregnancy. Follow these instructions at home: Medicines  Take over-the-counter and prescription medicines only as told by your doctor. Some medicines are safe and some medicines are not safe during pregnancy.  Take a prenatal vitamin that contains at least 600 micrograms (mcg) of folic acid.  If you have trouble pooping (constipation), take medicine that will make your stool soft (stool softener) if your doctor approves. Eating and drinking   Eat regular, healthy meals.  Avoid raw meat and uncooked cheese.  If you get low calcium from the food you eat, talk to your doctor about taking a daily calcium supplement.  Avoid foods that are high in fat and sugars, such as fried and sweet foods.  If you feel sick to your stomach (nauseous) or throw up (vomit): ? Eat 4 or 5 small meals a day instead of 3 large meals. ? Try eating a few soda crackers. ? Drink liquids between meals instead of during meals.  To prevent constipation: ? Eat foods that are high in fiber, like fresh fruits and vegetables, whole grains, and beans. ? Drink enough fluids to keep your pee (urine) clear or pale yellow. Activity  Exercise only as told by your doctor. Stop exercising if you start to have  cramps.  Do not exercise if it is too hot, too humid, or if you are in a place of great height (high altitude).  Avoid heavy lifting.  Wear low-heeled shoes. Sit and stand up straight.  You can continue to have sex unless your doctor tells you not to. Relieving pain and discomfort  Wear a good support bra if your breasts are tender.  Take warm water baths (sitz baths) to soothe pain or discomfort caused by hemorrhoids. Use hemorrhoid cream if your doctor approves.  Rest with your legs raised if you have leg cramps or low back pain.  If you develop puffy, bulging  veins (varicose veins) in your legs: ? Wear support hose or compression stockings as told by your doctor. ? Raise (elevate) your feet for 15 minutes, 3-4 times a day. ? Limit salt in your food. Prenatal care  Write down your questions. Take them to your prenatal visits.  Keep all your prenatal visits as told by your doctor. This is important. Safety  Wear your seat belt when driving.  Make a list of emergency phone numbers, including numbers for family, friends, the hospital, and police and fire departments. General instructions  Ask your doctor about the right foods to eat or for help finding a counselor, if you need these services.  Ask your doctor about local prenatal classes. Begin classes before month 6 of your pregnancy.  Do not use hot tubs, steam rooms, or saunas.  Do not douche or use tampons or scented sanitary pads.  Do not cross your legs for long periods of time.  Visit your dentist if you have not done so. Use a soft toothbrush to brush your teeth. Floss gently.  Avoid all smoking, herbs, and alcohol. Avoid drugs that are not approved by your doctor.  Do not use any products that contain nicotine or tobacco, such as cigarettes and e-cigarettes. If you need help quitting, ask your doctor.  Avoid cat litter boxes and soil used by cats. These carry germs that can cause birth defects in the baby and  can cause a loss of your baby (miscarriage) or stillbirth. Contact a doctor if:  You have mild cramps or pressure in your lower belly.  You have pain when you pee (urinate).  You have bad smelling fluid coming from your vagina.  You continue to feel sick to your stomach (nauseous), throw up (vomit), or have watery poop (diarrhea).  You have a nagging pain in your belly area.  You feel dizzy. Get help right away if:  You have a fever.  You are leaking fluid from your vagina.  You have spotting or bleeding from your vagina.  You have severe belly cramping or pain.  You lose or gain weight rapidly.  You have trouble catching your breath and have chest pain.  You notice sudden or extreme puffiness (swelling) of your face, hands, ankles, feet, or legs.  You have not felt the baby move in over an hour.  You have severe headaches that do not go away when you take medicine.  You have trouble seeing. Summary  The second trimester is from week 14 through week 27 (months 4 through 6). This is often the time in pregnancy that you feel your best.  To take care of yourself and your unborn baby, you will need to eat healthy meals, take medicines only if your doctor tells you to do so, and do activities that are safe for you and your baby.  Call your doctor if you get sick or if you notice anything unusual about your pregnancy. Also, call your doctor if you need help with the right food to eat, or if you want to know what activities are safe for you. This information is not intended to replace advice given to you by your health care provider. Make sure you discuss any questions you have with your health care provider. Document Released: 09/04/2009 Document Revised: 10/02/2018 Document Reviewed: 07/16/2016 Elsevier Patient Education  2020 [employer].

## 2019-02-17 ENCOUNTER — Telehealth: Payer: Self-pay

## 2019-02-17 NOTE — Telephone Encounter (Signed)
Ms Kathleen Nguyen has called me requesting helping getting a dentist who accepts medicaid.I will inform her via phone call once I make appointment for her. Honor Loh RN BSN [phone]

## 2019-02-19 ENCOUNTER — Telehealth: Payer: Self-pay | Admitting: Obstetrics & Gynecology

## 2019-02-19 NOTE — Telephone Encounter (Signed)
Spoke to patient w/ Arabic interpreter ID# [phone] about her appointment on 8/28 @ 8:20. Patient instructed to wear a face mask for the entire appointment and no visitors are allowed with her during the visit. Patient screened for covid symptoms and denied having any. Patient instructed to come fasting

## 2019-02-22 ENCOUNTER — Other Ambulatory Visit: Payer: BC Managed Care – PPO

## 2019-02-22 ENCOUNTER — Other Ambulatory Visit: Payer: Self-pay

## 2019-02-22 ENCOUNTER — Ambulatory Visit (INDEPENDENT_AMBULATORY_CARE_PROVIDER_SITE_OTHER): Payer: BC Managed Care – PPO | Admitting: Obstetrics & Gynecology

## 2019-02-22 ENCOUNTER — Other Ambulatory Visit: Payer: Self-pay | Admitting: General Practice

## 2019-02-22 VITALS — BP 103/68 | HR 100 | Temp 84.2°F | Wt 189.0 lb

## 2019-02-22 DIAGNOSIS — Z3480 Encounter for supervision of other normal pregnancy, unspecified trimester: Secondary | ICD-10-CM

## 2019-02-22 DIAGNOSIS — Z23 Encounter for immunization: Secondary | ICD-10-CM

## 2019-02-22 DIAGNOSIS — O034 Incomplete spontaneous abortion without complication: Secondary | ICD-10-CM

## 2019-02-22 DIAGNOSIS — O99210 Obesity complicating pregnancy, unspecified trimester: Secondary | ICD-10-CM

## 2019-02-22 DIAGNOSIS — Z3A29 29 weeks gestation of pregnancy: Secondary | ICD-10-CM

## 2019-02-22 DIAGNOSIS — G4459 Other complicated headache syndrome: Secondary | ICD-10-CM

## 2019-02-22 DIAGNOSIS — O99012 Anemia complicating pregnancy, second trimester: Secondary | ICD-10-CM

## 2019-02-22 DIAGNOSIS — Z789 Other specified health status: Secondary | ICD-10-CM

## 2019-02-22 DIAGNOSIS — O99013 Anemia complicating pregnancy, third trimester: Secondary | ICD-10-CM

## 2019-02-22 DIAGNOSIS — Z98891 History of uterine scar from previous surgery: Secondary | ICD-10-CM

## 2019-02-22 DIAGNOSIS — Z9229 Personal history of other drug therapy: Secondary | ICD-10-CM | POA: Insufficient documentation

## 2019-02-22 DIAGNOSIS — O99213 Obesity complicating pregnancy, third trimester: Secondary | ICD-10-CM

## 2019-02-22 MED ORDER — SUMATRIPTAN SUCCINATE 100 MG PO TABS: 100.0000 mg | ORAL_TABLET | Freq: Once | ORAL | 11 refills | Status: DC | PRN

## 2019-02-22 NOTE — Progress Notes (Signed)
   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  She is currently monitored for the following issues for this high-risk pregnancy and has Tinea pedis of left foot; Uterine adenomyoma; Supervision of other normal pregnancy, antepartum; Ankylosing spondylitis ([place]); Language barrier; History of cesarean delivery; Anemia in pregnancy; and Rudimentary uterine horn on their problem list.  Patient reports headache.  Contractions: Not present. Vag. Bleeding: None.  Movement: Present. Denies leaking of fluid.   The following portions of the patient's history were reviewed and updated as appropriate: allergies, current medications, past family history, past medical history, past social history, past surgical history and problem list.   Objective:   Vitals:   02/22/19 0836  BP: 103/68  Pulse: 100  Weight: 189 lb (85.7 kg)    Fetal Status: Fetal Heart Rate (bpm): 150   Movement: Present     General:  Alert, oriented and cooperative. Patient is in no acute distress.  Skin: Skin is warm and dry. No rash noted.   Cardiovascular: Normal heart rate noted  Respiratory: Normal respiratory effort, no problems with respiration noted  Abdomen: Soft, gravid, appropriate for gestational age.  Pain/Pressure: Absent     Pelvic: Cervical exam deferred        Extremities: Normal range of motion.  Edema: Trace  Mental Status: Normal mood and affect. Normal behavior. Normal judgment and thought content.   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 Supervision of other normal pregnancy, antepartum - CBC - HIV Antibody (routine testing w rflx) - RPR - Glucose Tolerance, 2 Hours w/1 Hour  2. Anemia during pregnancy in second trimester - Vitamin B12 - Folate - Iron and TIBC - Ferritin - CBC  3. History of cesarean delivery x 2 in [place] for breech - plans third c/s at 39 weeks  4. Obesity in pregnancy - 2 hour GTT  today - offered nutrition consult  5. Language barrier - live interpretor present  6. Headache- sounds like a migraine but I will order labs to rule out pre eclampsia - immitrex prescribed  Preterm labor symptoms and general obstetric precautions including but not limited to vaginal bleeding, contractions, leaking of fluid and fetal movement were reviewed in detail with the patient. Please refer to After Visit Summary for other counseling recommendations.   Return in about 3 weeks (around 03/15/2019) for virtual is fine.  Future Appointments  Date Time Provider [place]  02/22/2019  9:15 AM Emily Filbert, MD [facility]  04/05/2019 10:20 AM Ofilia Neas, PA-C [facility] None    Emily Filbert, MD

## 2019-02-22 NOTE — Progress Notes (Signed)
Headache past 4 days  Sensitivity to light and sound Blood pressures have been in the 120-80's and pulses 70-90's

## 2019-02-23 ENCOUNTER — Other Ambulatory Visit: Payer: Self-pay | Admitting: Obstetrics & Gynecology

## 2019-02-23 LAB — FERRITIN: Ferritin: 7 ng/mL — ABNORMAL LOW (ref 15–150)

## 2019-02-23 LAB — RPR: RPR Ser Ql: NONREACTIVE

## 2019-02-23 LAB — COMPREHENSIVE METABOLIC PANEL
ALT: 7 IU/L (ref 0–32)
AST: 11 IU/L (ref 0–40)
Albumin/Globulin Ratio: 1.3 (ref 1.2–2.2)
Albumin: 3.5 g/dL — ABNORMAL LOW (ref 3.8–4.8)
Alkaline Phosphatase: 35 IU/L — ABNORMAL LOW (ref 39–117)
BUN/Creatinine Ratio: 17 (ref 9–23)
BUN: 6 mg/dL (ref 6–20)
Bilirubin Total: <0.2 mg/dL (ref 0.0–1.2)
CO2: 20 mmol/L (ref 20–29)
Calcium: 8.7 mg/dL (ref 8.7–10.2)
Chloride: 103 mmol/L (ref 96–106)
Creatinine, Ser: 0.36 mg/dL — ABNORMAL LOW (ref 0.57–1.00)
GFR calc Af Amer: 164 mL/min/{1.73_m2} (ref >59)
GFR calc non Af Amer: 142 mL/min/{1.73_m2} (ref >59)
Globulin, Total: 2.7 g/dL (ref 1.5–4.5)
Glucose: 77 mg/dL (ref 65–99)
Potassium: 4 mmol/L (ref 3.5–5.2)
Sodium: 139 mmol/L (ref 134–144)
Total Protein: 6.2 g/dL (ref 6.0–8.5)

## 2019-02-23 LAB — CBC
Hematocrit: 30.1 % — ABNORMAL LOW (ref 34.0–46.6)
Hemoglobin: 9.6 g/dL — ABNORMAL LOW (ref 11.1–15.9)
MCH: 27.4 pg (ref 26.6–33.0)
MCHC: 31.9 g/dL (ref 31.5–35.7)
MCV: 86 fL (ref 79–97)
Platelets: 310 10*3/uL (ref 150–450)
RBC: 3.5 x10E6/uL — ABNORMAL LOW (ref 3.77–5.28)
RDW: 13.1 % (ref 11.7–15.4)
WBC: 6.1 10*3/uL (ref 3.4–10.8)

## 2019-02-23 LAB — IRON AND TIBC
Iron Saturation: 7 % — CL (ref 15–55)
Iron: 32 ug/dL (ref 27–159)
Total Iron Binding Capacity: 453 ug/dL — ABNORMAL HIGH (ref 250–450)
UIBC: 421 ug/dL (ref 131–425)

## 2019-02-23 LAB — GLUCOSE TOLERANCE, 2 HOURS W/ 1HR
Glucose, 1 hour: 148 mg/dL (ref 65–179)
Glucose, 2 hour: 107 mg/dL (ref 65–152)
Glucose, Fasting: 81 mg/dL (ref 65–91)

## 2019-02-23 LAB — PROTEIN / CREATININE RATIO, URINE
Creatinine, Urine: 95.8 mg/dL
Protein, Ur: 17.7 mg/dL
Protein/Creat Ratio: 185 mg/g creat (ref 0–200)

## 2019-02-23 LAB — FOLATE: Folate: 18.8 ng/mL (ref >3.0)

## 2019-02-23 LAB — HIV ANTIBODY (ROUTINE TESTING W REFLEX): HIV Screen 4th Generation wRfx: NONREACTIVE

## 2019-02-23 LAB — TSH: TSH: 0.889 u[IU]/mL (ref 0.450–4.500)

## 2019-02-23 LAB — VITAMIN B12: Vitamin B-12: 366 pg/mL (ref 232–1245)

## 2019-02-23 NOTE — Progress Notes (Signed)
fereheme x 2 ordered

## 2019-02-25 ENCOUNTER — Telehealth: Payer: Self-pay | Admitting: *Deleted

## 2019-02-25 NOTE — Telephone Encounter (Addendum)
I called Kathleen Nguyen with [name] [phone] and explained I was calling with information from Dr. Hulan Fray. I explained she is anemic and Dr.Dove has ordered IV iron ( fereheme) for 2 infusions. I gave her the appointment for 03/05/19 at 0900. I explained it is at [facility] and she will go to Admitting. I gave the the address. IAfter many explanations She voices understanding.  Linda,RN

## 2019-02-25 NOTE — Telephone Encounter (Signed)
I have scheduled first fereheme with short stay for 03/05/19 at 0900.  Linda,RN

## 2019-02-25 NOTE — Telephone Encounter (Signed)
-----   Message from Emily Filbert, MD sent at 02/23/2019  1:44 PM EDT ----- Please let her know that I have ordered fereheme x 2 for her anemia and iron deficiency.

## 2019-03-02 ENCOUNTER — Telehealth: Payer: Self-pay

## 2019-03-02 NOTE — Telephone Encounter (Signed)
Ms Jayse called me with questions regarding iron infusion and the address to the hospital. I have instructed her to go to [facility] admitting and that I will schedule transportation for her per her request.She verbalized understanding. Honor Loh RN BSN PCCN [phone]

## 2019-03-03 ENCOUNTER — Telehealth: Payer: Self-pay

## 2019-03-03 NOTE — Telephone Encounter (Signed)
I have called Kathleen Nguyen to schedule a ride to and from hospital appointment on 03/05/2019. She verbalized understanding of instructions given. Honor Loh RN BSN PCCN

## 2019-03-05 ENCOUNTER — Encounter (HOSPITAL_COMMUNITY)
Admission: RE | Admit: 2019-03-05 | Discharge: 2019-03-05 | Disposition: A | Discharge status: Home / Self Care | Payer: BC Managed Care – PPO | Source: Ambulatory Visit | Attending: Obstetrics & Gynecology | Admitting: Obstetrics & Gynecology

## 2019-03-05 ENCOUNTER — Other Ambulatory Visit: Payer: Self-pay

## 2019-03-05 DIAGNOSIS — O99013 Anemia complicating pregnancy, third trimester: Secondary | ICD-10-CM | POA: Diagnosis not present

## 2019-03-05 MED ORDER — SODIUM CHLORIDE 0.9 % IV SOLN
510.0000 mg | INTRAVENOUS | Status: DC
  Administered 2019-03-05: 510 mg via INTRAVENOUS
  Filled 2019-03-05: qty 17

## 2019-03-09 ENCOUNTER — Telehealth: Payer: Self-pay

## 2019-03-09 NOTE — Telephone Encounter (Signed)
Ms Kathleen Nguyen called me requesting transport assistance to and from hospital appointment on Friday September 18th Transport has been arranged and Ms Kathleen Nguyen has been informed  Honor Loh RN BSN PCCN  [phone]

## 2019-03-10 NOTE — Progress Notes (Signed)
k

## 2019-03-12 ENCOUNTER — Other Ambulatory Visit: Payer: Self-pay

## 2019-03-12 ENCOUNTER — Encounter (HOSPITAL_COMMUNITY)
Admission: RE | Admit: 2019-03-12 | Discharge: 2019-03-12 | Disposition: A | Discharge status: Home / Self Care | Payer: BC Managed Care – PPO | Source: Ambulatory Visit | Attending: Obstetrics & Gynecology | Admitting: Obstetrics & Gynecology

## 2019-03-12 ENCOUNTER — Inpatient Hospital Stay (HOSPITAL_COMMUNITY): Admission: RE | Admit: 2019-03-12 | Payer: BC Managed Care – PPO | Source: Ambulatory Visit

## 2019-03-12 DIAGNOSIS — O99013 Anemia complicating pregnancy, third trimester: Secondary | ICD-10-CM | POA: Diagnosis not present

## 2019-03-12 MED ORDER — SODIUM CHLORIDE 0.9 % IV SOLN
510.0000 mg | INTRAVENOUS | Status: DC
  Administered 2019-03-12: 510 mg via INTRAVENOUS
  Filled 2019-03-12: qty 510

## 2019-03-15 ENCOUNTER — Ambulatory Visit (HOSPITAL_COMMUNITY): Payer: BC Managed Care – PPO | Admitting: *Deleted

## 2019-03-15 ENCOUNTER — Encounter (HOSPITAL_COMMUNITY): Payer: Self-pay

## 2019-03-15 ENCOUNTER — Other Ambulatory Visit: Payer: Self-pay

## 2019-03-15 ENCOUNTER — Ambulatory Visit (INDEPENDENT_AMBULATORY_CARE_PROVIDER_SITE_OTHER): Payer: BC Managed Care – PPO | Admitting: Obstetrics and Gynecology

## 2019-03-15 ENCOUNTER — Telehealth: Payer: Self-pay

## 2019-03-15 ENCOUNTER — Ambulatory Visit (HOSPITAL_COMMUNITY)
Admission: RE | Admit: 2019-03-15 | Discharge: 2019-03-15 | Disposition: A | Discharge status: Home / Self Care | Payer: BC Managed Care – PPO | Source: Ambulatory Visit | Attending: Obstetrics and Gynecology | Admitting: Obstetrics and Gynecology

## 2019-03-15 ENCOUNTER — Other Ambulatory Visit (HOSPITAL_COMMUNITY): Payer: Self-pay | Admitting: *Deleted

## 2019-03-15 VITALS — BP 100/69 | HR 99

## 2019-03-15 DIAGNOSIS — Z98891 History of uterine scar from previous surgery: Secondary | ICD-10-CM | POA: Diagnosis not present

## 2019-03-15 DIAGNOSIS — O99210 Obesity complicating pregnancy, unspecified trimester: Secondary | ICD-10-CM | POA: Diagnosis not present

## 2019-03-15 DIAGNOSIS — Z789 Other specified health status: Secondary | ICD-10-CM | POA: Diagnosis not present

## 2019-03-15 DIAGNOSIS — Q51818 Other congenital malformations of uterus: Secondary | ICD-10-CM

## 2019-03-15 DIAGNOSIS — Z3A32 32 weeks gestation of pregnancy: Secondary | ICD-10-CM | POA: Diagnosis not present

## 2019-03-15 DIAGNOSIS — Z3480 Encounter for supervision of other normal pregnancy, unspecified trimester: Secondary | ICD-10-CM

## 2019-03-15 DIAGNOSIS — O34219 Maternal care for unspecified type scar from previous cesarean delivery: Secondary | ICD-10-CM

## 2019-03-15 DIAGNOSIS — O99013 Anemia complicating pregnancy, third trimester: Secondary | ICD-10-CM | POA: Diagnosis not present

## 2019-03-15 DIAGNOSIS — O99213 Obesity complicating pregnancy, third trimester: Secondary | ICD-10-CM | POA: Diagnosis not present

## 2019-03-15 DIAGNOSIS — Z362 Encounter for other antenatal screening follow-up: Secondary | ICD-10-CM

## 2019-03-15 DIAGNOSIS — Q519 Congenital malformation of uterus and cervix, unspecified: Secondary | ICD-10-CM | POA: Insufficient documentation

## 2019-03-15 NOTE — Progress Notes (Signed)
   TELEHEALTH VIRTUAL OBSTETRICS VISIT ENCOUNTER NOTE  Clinic: [facility]  I connected with Kathleen Nguyen on 03/15/19 at 10:15 AM EDT by telephone at home and verified that I am speaking with the correct person using two identifiers.   I discussed the limitations, risks, security and privacy concerns of performing an evaluation and management service by telephone and the availability of in person appointments. I also discussed with the patient that there may be a patient responsible charge related to this service. The patient expressed understanding and agreed to proceed.  Subjective:  Kathleen Nguyen is a 33 y.o. [id number] at [redacted]w[redacted]d being followed for ongoing prenatal care.  She is currently monitored for the following issues for this high-risk pregnancy and has Tinea pedis of left foot; Uterine adenomyoma; Supervision of other normal pregnancy, antepartum; Ankylosing spondylitis ([place]); Language barrier; History of cesarean delivery; Anemia in pregnancy; and Rudimentary uterine horn on their problem list.  Patient reports no complaints. Reports fetal movement. Denies any contractions, bleeding or leaking of fluid.   The following portions of the patient's history were reviewed and updated as appropriate: allergies, current medications, past family history, past medical history, past social history, past surgical history and problem list.   Objective:   Vitals:   03/15/19 1012  BP: 100/69  Pulse: 99    Babyscripts Data Reviewed: not applicable  General:  Alert, oriented and cooperative.   Mental Status: Normal mood and affect perceived. Normal judgment and thought content.  Rest of physical exam deferred due to type of encounter  Assessment and Plan:  Pregnancy: [id number] at [redacted]w[redacted]d 1. History of cesarean delivery Request sent for 39wk rpt c/s Has 2ndry medicaid ask more about BTL and sign papers nv  2. Language barrier Interpreter used  3. Supervision of other  normal pregnancy, antepartum See above.   4. Anemia during pregnancy in third trimester Sp feraheme x 2 earlier this month  Preterm labor symptoms and general obstetric precautions including but not limited to vaginal bleeding, contractions, leaking of fluid and fetal movement were reviewed in detail with the patient.  I discussed the assessment and treatment plan with the patient. The patient was provided an opportunity to ask questions and all were answered. The patient agreed with the plan and demonstrated an understanding of the instructions. The patient was advised to call back or seek an in-person office evaluation/go to [facility] at [facility] for any urgent or concerning symptoms. Please refer to After Visit Summary for other counseling recommendations.   I provided 10 minutes of non-face-to-face time during this encounter. The visit was conducted via Phone-medicine  Return in about 1 week (around 03/22/2019).  Future Appointments  Date Time Provider [place]  04/05/2019 10:20 AM Ofilia Neas, PA-C [facility] None  04/05/2019  1:15 PM [place] NURSE [place] [facility]  04/05/2019  1:15 PM [facility] [place] 4 [facility]    Fed Ceci, [place] for [employer], [place]

## 2019-03-15 NOTE — Telephone Encounter (Signed)
Ms Kathleen Nguyen called requesting transportation arrangement from the Drs office and back to her house. Same done. Honor Loh RN BSN PCCN [phone]

## 2019-03-18 ENCOUNTER — Encounter (HOSPITAL_COMMUNITY): Payer: BC Managed Care – PPO

## 2019-03-22 ENCOUNTER — Telehealth: Payer: Self-pay

## 2019-03-22 NOTE — Progress Notes (Deleted)
Office Visit Note  Patient: Kathleen Nguyen             Date of Birth: 07-Oct-1985           MRN: [id number]             PCP: Clent Demark, PA-C Referring: Clent Demark, PA-C Visit Date: 04/05/2019 Occupation: @GUAROCC @  Subjective:  No chief complaint on file.  Cimzia 200 mg every 14 days. Last TB gold negative on 12/28/2018.  Due for TB gold today and will monitor yearly.  Most recent CBC/CMP within normal limits except for low hemoglobin, alk phos, and creatinine on 02/22/2019.  History of Present Illness: Kathleen Nguyen is a 33 y.o. female ***   Activities of Daily Living:  Patient reports morning stiffness for *** {minute/hour:19697}.   Patient {ACTIONS;DENIES/REPORTS:21021675::"Denies"} nocturnal pain.  Difficulty dressing/grooming: {ACTIONS;DENIES/REPORTS:21021675::"Denies"} Difficulty climbing stairs: {ACTIONS;DENIES/REPORTS:21021675::"Denies"} Difficulty getting out of chair: {ACTIONS;DENIES/REPORTS:21021675::"Denies"} Difficulty using hands for taps, buttons, cutlery, and/or writing: {ACTIONS;DENIES/REPORTS:21021675::"Denies"}  No Rheumatology ROS completed.   PMFS History:  Patient Active Problem List   Diagnosis Date Noted   Rudimentary uterine horn 01/01/2019   Anemia in pregnancy 12/07/2018   Language barrier 12/04/2018   History of cesarean delivery 12/04/2018   Ankylosing spondylitis ([place]) 11/11/2018   Supervision of other normal pregnancy, antepartum 05/18/2018   Uterine adenomyoma 12/11/2016   Tinea pedis of left foot 12/05/2016    Past Medical History:  Diagnosis Date   Ankylosing spondylitis ([facility])    Ankylosing spondylitis (H[facility])    Rheumatoid arthritis ([place])     Family History  Problem Relation Age of Onset   Arthritis/Rheumatoid Mother    Past Surgical History:  Procedure Laterality Date   CESAREAN SECTION     CESAREAN SECTION     Social History   Social History Narrative   Not on file   Immunization History    Administered Date(s) Administered   Influenza,inj,Quad PF,6+ Mos 02/22/2019   Tdap 12/18/2017, 02/22/2019     Objective: Vital Signs: LMP 08/03/2018    Physical Exam   Musculoskeletal Exam: ***  CDAI Exam: CDAI Score: -- Patient Global: --; Provider Global: -- Swollen: --; Tender: -- Joint Exam   No joint exam has been documented for this visit   There is currently no information documented on the homunculus. Go to the Rheumatology activity and complete the homunculus joint exam.  Investigation: No additional findings.  Imaging: [place] Mfm Ob Follow Up  Result Date: 03/15/2019 ----------------------------------------------------------------------  OBSTETRICS REPORT                       (Signed Final 03/15/2019 09:03 am) ---------------------------------------------------------------------- Patient Info  ID #:       [id number]                          D.O.B.:  01/01/1986 (33 yrs)  Name:       [facility]                   Visit Date: 03/15/2019 07:42 am ---------------------------------------------------------------------- Performed By  Performed By:     Georgie Chard        Ref. Address:     [address]  [place], [place]  Attending:        Johnell Comings MD         [facility]  Referred By:      Emily Filbert MD ---------------------------------------------------------------------- Orders   #  Description                          Code         Ordered By   1  [place] MFM OB FOLLOW UP                  27062.37     MYRA DOVE  ----------------------------------------------------------------------   #  Order #                    Accession #                 Episode #   1  [id number]                   [id number]                  [id number]  ---------------------------------------------------------------------- Indications   Obesity complicating pregnancy, third          O99.213   trimester   Anemia during pregnancy in third trimester     O99.013   Previous cesarean delivery, antepartum         O34.219   [redacted] weeks gestation of pregnancy                Z3A.32  ---------------------------------------------------------------------- Vital Signs  Weight (lb): 189                               Height:        5'5"  BMI:         31.45 ---------------------------------------------------------------------- Fetal Evaluation  Num Of Fetuses:         1  Fetal Heart Rate(bpm):  155  Cardiac Activity:       Observed  Presentation:           Cephalic  Placenta:               Anterior  P. Cord Insertion:      Previously Visualized  Amniotic Fluid  AFI FV:      Within normal limits  AFI Sum(cm)     %Tile       Largest Pocket(cm)  10              16          3.89  RUQ(cm)       RLQ(cm)       LUQ(cm)        LLQ(cm)  3.89          0             3.44           0 ---------------------------------------------------------------------- Biometry  BPD:      78.8  mm     G. Age:  31w 4d         30  %    CI:        74.31   %    70 - 86                                                          FL/HC:      20.9   %    19.1 - 21.3  HC:      290.2  mm     G. Age:  32w 0d         14  %    HC/AC:      1.01        0.96 - 1.17  AC:      288.3  mm     G. Age:  32w 6d         73  %    FL/BPD:     76.9   %    71 - 87  FL:       60.6  mm     G. Age:  31w 4d         24  %    FL/AC:      21.0   %    20 - 24  HUM:      53.2  mm     G. Age:  31w 0d         30  %  Est. FW:    1934  gm      4 lb 4 oz     47  % ---------------------------------------------------------------------- OB History  Gravidity:    4         Term:   2        Prem:   0        SAB:   1  TOP:          0       Ectopic:  0        Living: 2  ---------------------------------------------------------------------- Gestational Age  LMP:           32w 0d        Date:  08/03/18                 EDD:   05/10/19  U/S Today:     32w 0d  EDD:   05/10/19  Best:          Milderd Meager 0d     Det. By:  LMP  (08/03/18)          EDD:   05/10/19 ---------------------------------------------------------------------- Anatomy  Thoracic:              Appears normal         Abdomen:                Appears normal  Heart:                 Appears normal         Kidneys:                Appear normal                         (4CH, axis, and                         situs)  Diaphragm:             Appears normal         Bladder:                Appears normal  Stomach:               Appears normal, left                         sided ---------------------------------------------------------------------- Comments  This patient was seen for a follow up growth scan due to  anemia in pregnancy.  She denies any other problems in her  current pregnancy.  She was informed that the fetal growth appears appropriate  for her gestational age.  The amniotic fluid level appeared to  be subjectively on the lower normal end.  The total AFI was  10 cm after fetal movement.  A 2 x 3 cm pocket of cord free  amniotic fluid was noted today.  Due to the lower amniotic fluid level, a follow-up exam was  scheduled in 3 weeks. ----------------------------------------------------------------------                   Johnell Comings, MD Electronically Signed Final Report   03/15/2019 09:03 am ----------------------------------------------------------------------   Recent Labs: Lab Results  Component Value Date   WBC 6.1 02/22/2019   HGB 9.6 (L) 02/22/2019   PLT 310 02/22/2019   NA 139 02/22/2019   K 4.0 02/22/2019   CL 103 02/22/2019   CO2 20 02/22/2019   GLUCOSE 77 02/22/2019   BUN 6 02/22/2019   CREATININE 0.36 (L) 02/22/2019   BILITOT <0.2 02/22/2019   ALKPHOS 35 (L)  02/22/2019   AST 11 02/22/2019   ALT 7 02/22/2019   PROT 6.2 02/22/2019   ALBUMIN 3.5 (L) 02/22/2019   CALCIUM 8.7 02/22/2019   GFRAA 164 02/22/2019   QFTBGOLDPLUS NEGATIVE 12/28/2018    Speciality Comments: No specialty comments available.  Procedures:  No procedures performed Allergies: Patient has no known allergies.   Assessment / Plan:     Visit Diagnoses: No diagnosis found.  Orders: No orders of the defined types were placed in this encounter.  No orders of the defined types were placed in this encounter.   Face-to-face time spent with patient was *** minutes. Greater than 50% of time was spent in counseling and coordination of care.  Follow-Up  Instructions: No follow-ups on file.   Ofilia Neas, PA-C  Note - This record has been created using Dragon software.  Chart creation errors have been sought, but may not always  have been located. Such creation errors do not reflect on  the standard of medical care.

## 2019-03-22 NOTE — Telephone Encounter (Signed)
I have arranged transportation for Ms Klingshirn for tomorrow 03/23/19 for a 2:15pm appointment. I have informed her of the same. Honor Loh RN BSN PCCN  [phone]

## 2019-03-23 ENCOUNTER — Ambulatory Visit (INDEPENDENT_AMBULATORY_CARE_PROVIDER_SITE_OTHER): Payer: BC Managed Care – PPO | Admitting: Medical

## 2019-03-23 ENCOUNTER — Other Ambulatory Visit: Payer: Self-pay

## 2019-03-23 ENCOUNTER — Encounter: Payer: Self-pay | Admitting: Medical

## 2019-03-23 VITALS — BP 110/75 | HR 93

## 2019-03-23 DIAGNOSIS — O99013 Anemia complicating pregnancy, third trimester: Secondary | ICD-10-CM

## 2019-03-23 DIAGNOSIS — Z98891 History of uterine scar from previous surgery: Secondary | ICD-10-CM

## 2019-03-23 DIAGNOSIS — Z789 Other specified health status: Secondary | ICD-10-CM

## 2019-03-23 DIAGNOSIS — Z3A33 33 weeks gestation of pregnancy: Secondary | ICD-10-CM

## 2019-03-23 DIAGNOSIS — Z3480 Encounter for supervision of other normal pregnancy, unspecified trimester: Secondary | ICD-10-CM

## 2019-03-23 DIAGNOSIS — Z9229 Personal history of other drug therapy: Secondary | ICD-10-CM

## 2019-03-23 NOTE — Patient Instructions (Addendum)
Fetal Movement Counts Patient Name: ________________________________________________ Patient Due Date: ____________________ What is a fetal movement count?  A fetal movement count is the number of times that you feel your baby move during a certain amount of time. This may also be called a fetal kick count. A fetal movement count is recommended for every pregnant woman. You may be asked to start counting fetal movements as early as week 28 of your pregnancy. Pay attention to when your baby is most active. You may notice your baby's sleep and wake cycles. You may also notice things that make your baby move more. You should do a fetal movement count:  When your baby is normally most active.  At the same time each day. A good time to count movements is while you are resting, after having something to eat and drink. How do I count fetal movements? 1. Find a quiet, comfortable area. Sit, or lie down on your side. 2. Write down the date, the start time and stop time, and the number of movements that you felt between those two times. Take this information with you to your health care visits. 3. For 2 hours, count kicks, flutters, swishes, rolls, and jabs. You should feel at least 10 movements during 2 hours. 4. You may stop counting after you have felt 10 movements. 5. If you do not feel 10 movements in 2 hours, have something to eat and drink. Then, keep resting and counting for 1 hour. If you feel at least 4 movements during that hour, you may stop counting. Contact a health care provider if:  You feel fewer than 4 movements in 2 hours.  Your baby is not moving like he or she usually does. Date: ____________ Start time: ____________ Stop time: ____________ Movements: ____________ Date: ____________ Start time: ____________ Stop time: ____________ Movements: ____________ Date: ____________ Start time: ____________ Stop time: ____________ Movements: ____________ Date: ____________ Start time:  ____________ Stop time: ____________ Movements: ____________ Date: ____________ Start time: ____________ Stop time: ____________ Movements: ____________ Date: ____________ Start time: ____________ Stop time: ____________ Movements: ____________ Date: ____________ Start time: ____________ Stop time: ____________ Movements: ____________ Date: ____________ Start time: ____________ Stop time: ____________ Movements: ____________ Date: ____________ Start time: ____________ Stop time: ____________ Movements: ____________ This information is not intended to replace advice given to you by your health care provider. Make sure you discuss any questions you have with your health care provider. Document Released: 07/10/2006 Document Revised: 06/30/2018 Document Reviewed: 07/20/2015 Elsevier Patient Education  2020 [employer] Braxton Hicks Contractions Contractions of the uterus can occur throughout pregnancy, but they are not always a sign that you are in labor. You may have practice contractions called Braxton Hicks contractions. These false labor contractions are sometimes confused with true labor. What are Braxton Hicks contractions? Braxton Hicks contractions are tightening movements that occur in the muscles of the uterus before labor. Unlike true labor contractions, these contractions do not result in opening (dilation) and thinning of the cervix. Toward the end of pregnancy (32-34 weeks), Braxton Hicks contractions can happen more often and may become stronger. These contractions are sometimes difficult to tell apart from true labor because they can be very uncomfortable. You should not feel embarrassed if you go to the hospital with false labor. Sometimes, the only way to tell if you are in true labor is for your health care provider to look for changes in the cervix. The health care provider will do a physical exam and may monitor your contractions. If you   are not in true labor, the exam should show  that your cervix is not dilating and your water has not broken. If there are no other health problems associated with your pregnancy, it is completely safe for you to be sent home with false labor. You may continue to have Braxton Hicks contractions until you go into true labor. How to tell the difference between true labor and false labor True labor  Contractions last 30-70 seconds.  Contractions become very regular.  Discomfort is usually felt in the top of the uterus, and it spreads to the lower abdomen and low back.  Contractions do not go away with walking.  Contractions usually become more intense and increase in frequency.  The cervix dilates and gets thinner. False labor  Contractions are usually shorter and not as strong as true labor contractions.  Contractions are usually irregular.  Contractions are often felt in the front of the lower abdomen and in the groin.  Contractions may go away when you walk around or change positions while lying down.  Contractions get weaker and are shorter-lasting as time goes on.  The cervix usually does not dilate or become thin. Follow these instructions at home:   Take over-the-counter and prescription medicines only as told by your health care provider.  Keep up with your usual exercises and follow other instructions from your health care provider.  Eat and drink lightly if you think you are going into labor.  If Braxton Hicks contractions are making you uncomfortable: ? Change your position from lying down or resting to walking, or change from walking to resting. ? Sit and rest in a tub of warm water. ? Drink enough fluid to keep your urine pale yellow. Dehydration may cause these contractions. ? Do slow and deep breathing several times an hour.  Keep all follow-up prenatal visits as told by your health care provider. This is important. Contact a health care provider if:  You have a fever.  You have continuous pain in  your abdomen. Get help right away if:  Your contractions become stronger, more regular, and closer together.  You have fluid leaking or gushing from your vagina.  You pass blood-tinged mucus (bloody show).  You have bleeding from your vagina.  You have low back pain that you never had before.  You feel your baby's head pushing down and causing pelvic pressure.  Your baby is not moving inside you as much as it used to. Summary  Contractions that occur before labor are called Braxton Hicks contractions, false labor, or practice contractions.  Braxton Hicks contractions are usually shorter, weaker, farther apart, and less regular than true labor contractions. True labor contractions usually become progressively stronger and regular, and they become more frequent.  Manage discomfort from [facility] contractions by changing position, resting in a warm bath, drinking plenty of water, or practicing deep breathing. This information is not intended to replace advice given to you by your health care provider. Make sure you discuss any questions you have with your health care provider. Document Released: 10/24/2016 Document Revised: 05/23/2017 Document Reviewed: 10/24/2016 Elsevier Patient Education  2020 [place] [employer] [facility] [address] [place], [place] [address]

## 2019-03-23 NOTE — Progress Notes (Signed)
   PRENATAL VISIT NOTE  Subjective:  Kathleen Nguyen is a 33 y.o. [id number] at [redacted]w[redacted]d being seen today for ongoing prenatal care.  She is currently monitored for the following issues for this high-risk pregnancy and has Tinea pedis of left foot; Uterine adenomyoma; Supervision of other normal pregnancy, antepartum; Ankylosing spondylitis ([place]); Language barrier; History of cesarean delivery; Anemia in pregnancy; and Rudimentary uterine horn on their problem list.  Patient reports LE edema.  Contractions: Not present. Vag. Bleeding: None.  Movement: Present. Denies leaking of fluid.   The following portions of the patient's history were reviewed and updated as appropriate: allergies, current medications, past family history, past medical history, past social history, past surgical history and problem list.   Objective:   Vitals:   03/23/19 1448  BP: 110/75  Pulse: 93    Fetal Status: Fetal Heart Rate (bpm): 151 Fundal Height: 33 cm Movement: Present     General:  Alert, oriented and cooperative. Patient is in no acute distress.  Skin: Skin is warm and dry. No rash noted.   Cardiovascular: Normal heart rate noted  Respiratory: Normal respiratory effort, no problems with respiration noted  Abdomen: Soft, gravid, appropriate for gestational age.  Pain/Pressure: Absent     Pelvic: Cervical exam deferred        Extremities: Normal range of motion.  Edema: Trace  Mental Status: Normal mood and affect. Normal behavior. Normal judgment and thought content.   Assessment and Plan:  Pregnancy: [id number] at [redacted]w[redacted]d 1. History of tetanus, diphtheria, and acellular pertussis booster vaccination (Tdap)  2. Supervision of other normal pregnancy, antepartum - Doing well  - Desires Paragard for birth control   3. Language barrier - Interpreter present   4. History of cesarean delivery - Repeat scheduled 05/03/19  5. Anemia during pregnancy in third trimester - Had fereheme x 2, will check CBC at next  visit   Preterm labor symptoms and general obstetric precautions including but not limited to vaginal bleeding, contractions, leaking of fluid and fetal movement were reviewed in detail with the patient. Please refer to After Visit Summary for other counseling recommendations.   Return in about 2 weeks (around 04/06/2019) for In-Person, HOB.  Future Appointments  Date Time Provider [place]  04/05/2019  9:35 AM Jorje Guild, NP [facility] [place]  04/05/2019 10:20 AM Ofilia Neas, PA-C [facility] None  04/05/2019  1:15 PM [place] NURSE [place] [facility]  04/05/2019  1:15 PM [place] [facility]    Kerry Hough, PA-C

## 2019-04-05 ENCOUNTER — Ambulatory Visit (INDEPENDENT_AMBULATORY_CARE_PROVIDER_SITE_OTHER): Payer: BC Managed Care – PPO | Admitting: Student

## 2019-04-05 ENCOUNTER — Ambulatory Visit: Payer: BC Managed Care – PPO | Admitting: Physician Assistant

## 2019-04-05 ENCOUNTER — Telehealth: Payer: Self-pay

## 2019-04-05 ENCOUNTER — Ambulatory Visit (HOSPITAL_COMMUNITY)
Admission: RE | Admit: 2019-04-05 | Discharge: 2019-04-05 | Disposition: A | Discharge status: Home / Self Care | Payer: BC Managed Care – PPO | Source: Ambulatory Visit | Attending: Obstetrics and Gynecology | Admitting: Obstetrics and Gynecology

## 2019-04-05 ENCOUNTER — Other Ambulatory Visit (HOSPITAL_COMMUNITY)
Admission: RE | Admit: 2019-04-05 | Discharge: 2019-04-05 | Disposition: A | Discharge status: Home / Self Care | Payer: BC Managed Care – PPO | Source: Ambulatory Visit | Attending: Student | Admitting: Student

## 2019-04-05 ENCOUNTER — Other Ambulatory Visit: Payer: Self-pay

## 2019-04-05 ENCOUNTER — Encounter (HOSPITAL_COMMUNITY): Payer: Self-pay | Admitting: *Deleted

## 2019-04-05 ENCOUNTER — Ambulatory Visit (HOSPITAL_COMMUNITY): Payer: BC Managed Care – PPO | Admitting: *Deleted

## 2019-04-05 VITALS — BP 102/69 | HR 101 | Temp 98.2°F | Wt 191.0 lb

## 2019-04-05 DIAGNOSIS — O34219 Maternal care for unspecified type scar from previous cesarean delivery: Secondary | ICD-10-CM

## 2019-04-05 DIAGNOSIS — O99013 Anemia complicating pregnancy, third trimester: Secondary | ICD-10-CM

## 2019-04-05 DIAGNOSIS — Q51818 Other congenital malformations of uterus: Secondary | ICD-10-CM

## 2019-04-05 DIAGNOSIS — Z3480 Encounter for supervision of other normal pregnancy, unspecified trimester: Secondary | ICD-10-CM

## 2019-04-05 DIAGNOSIS — Z362 Encounter for other antenatal screening follow-up: Secondary | ICD-10-CM

## 2019-04-05 DIAGNOSIS — O99213 Obesity complicating pregnancy, third trimester: Secondary | ICD-10-CM | POA: Diagnosis not present

## 2019-04-05 DIAGNOSIS — Z3A35 35 weeks gestation of pregnancy: Secondary | ICD-10-CM

## 2019-04-05 DIAGNOSIS — Z789 Other specified health status: Secondary | ICD-10-CM | POA: Diagnosis not present

## 2019-04-05 DIAGNOSIS — M459 Ankylosing spondylitis of unspecified sites in spine: Secondary | ICD-10-CM | POA: Diagnosis not present

## 2019-04-05 DIAGNOSIS — E669 Obesity, unspecified: Secondary | ICD-10-CM | POA: Insufficient documentation

## 2019-04-05 DIAGNOSIS — O368130 Decreased fetal movements, third trimester, not applicable or unspecified: Secondary | ICD-10-CM

## 2019-04-05 DIAGNOSIS — O26893 Other specified pregnancy related conditions, third trimester: Secondary | ICD-10-CM

## 2019-04-05 DIAGNOSIS — O99891 Other specified diseases and conditions complicating pregnancy: Secondary | ICD-10-CM | POA: Diagnosis not present

## 2019-04-05 DIAGNOSIS — N898 Other specified noninflammatory disorders of vagina: Secondary | ICD-10-CM | POA: Diagnosis not present

## 2019-04-05 DIAGNOSIS — Z98891 History of uterine scar from previous surgery: Secondary | ICD-10-CM

## 2019-04-05 DIAGNOSIS — O2693 Pregnancy related conditions, unspecified, third trimester: Secondary | ICD-10-CM

## 2019-04-05 DIAGNOSIS — D649 Anemia, unspecified: Secondary | ICD-10-CM | POA: Diagnosis not present

## 2019-04-05 LAB — CBC
Hematocrit: 32.9 % — ABNORMAL LOW (ref 34.0–46.6)
Hemoglobin: 10.8 g/dL — ABNORMAL LOW (ref 11.1–15.9)
MCH: 28.5 pg (ref 26.6–33.0)
MCHC: 32.8 g/dL (ref 31.5–35.7)
MCV: 87 fL (ref 79–97)
Platelets: 217 10*3/uL (ref 150–450)
RBC: 3.79 x10E6/uL (ref 3.77–5.28)
RDW: 16 % — ABNORMAL HIGH (ref 11.7–15.4)
WBC: 5.4 10*3/uL (ref 3.4–10.8)

## 2019-04-05 NOTE — Patient Instructions (Addendum)
Signs and Symptoms of Labor Labor is your body's natural process of moving your baby, placenta, and umbilical cord out of your uterus. The process of labor usually starts when your baby is full-term, between 37 and 40 weeks of pregnancy. How will I know when I am close to going into labor? As your body prepares for labor and the birth of your baby, you may notice the following symptoms in the weeks and days before true labor starts:  Having a strong desire to get your home ready to receive your new baby. This is called nesting. Nesting may be a sign that labor is approaching, and it may occur several weeks before birth. Nesting may involve cleaning and organizing your home.  Passing a small amount of thick, bloody mucus out of your vagina (normal bloody show or losing your mucus plug). This may happen more than a week before labor begins, or it might occur right before labor begins as the opening of the cervix starts to widen (dilate). For some women, the entire mucus plug passes at once. For others, smaller portions of the mucus plug may gradually pass over several days.  Your baby moving (dropping) lower in your pelvis to get into position for birth (lightening). When this happens, you may feel more pressure on your bladder and pelvic bone and less pressure on your ribs. This may make it easier to breathe. It may also cause you to need to urinate more often and have problems with bowel movements.  Having "practice contractions" (Braxton Hicks contractions) that occur at irregular (unevenly spaced) intervals that are more than 10 minutes apart. This is also called false labor. False labor contractions are common after exercise or sexual activity, and they will stop if you change position, rest, or drink fluids. These contractions are usually mild and do not get stronger over time. They may feel like: ? A backache or back pain. ? Mild cramps, similar to menstrual cramps. ? Tightening or pressure in  your abdomen. Other early symptoms that labor may be starting soon include:  Nausea or loss of appetite.  Diarrhea.  Having a sudden burst of energy, or feeling very tired.  Mood changes.  Having trouble sleeping. How will I know when labor has begun? Signs that true labor has begun may include:  Having contractions that come at regular (evenly spaced) intervals and increase in intensity. This may feel like more intense tightening or pressure in your abdomen that moves to your back. ? Contractions may also feel like rhythmic pain in your upper thighs or back that comes and goes at regular intervals. ? For first-time mothers, this change in intensity of contractions often occurs at a more gradual pace. ? Women who have given birth before may notice a more rapid progression of contraction changes.  Having a feeling of pressure in the vaginal area.  Your water breaking (rupture of membranes). This is when the sac of fluid that surrounds your baby breaks. When this happens, you will notice fluid leaking from your vagina. This may be clear or blood-tinged. Labor usually starts within 24 hours of your water breaking, but it may take longer to begin. ? Some women notice this as a gush of fluid. ? Others notice that their underwear repeatedly becomes damp. Follow these instructions at home:   When labor starts, or if your water breaks, call your health care provider or nurse care line. Based on your situation, they will determine when you should go in for an   exam.  When you are in early labor, you may be able to rest and manage symptoms at home. Some strategies to try at home include: ? Breathing and relaxation techniques. ? Taking a warm bath or shower. ? Listening to music. ? Using a heating pad on the lower back for pain. If you are directed to use heat:  Place a towel between your skin and the heat source.  Leave the heat on for 20-30 minutes.  Remove the heat if your skin turns  bright red. This is especially important if you are unable to feel pain, heat, or cold. You may have a greater risk of getting burned. Get help right away if:  You have painful, regular contractions that are 5 minutes apart or less.  Labor starts before you are [redacted] weeks along in your pregnancy.  You have a fever.  You have a headache that does not go away.  You have bright red blood coming from your vagina.  You do not feel your baby moving.  You have a sudden onset of: ? Severe headache with vision problems. ? Nausea, vomiting, or diarrhea. ? Chest pain or shortness of breath. These symptoms may be an emergency. If your health care provider recommends that you go to the hospital or birth center where you plan to deliver, do not drive yourself. Have someone else drive you, or call emergency services (911 in the [place] Summary  Labor is your body's natural process of moving your baby, placenta, and umbilical cord out of your uterus.  The process of labor usually starts when your baby is full-term, between 60 and 40 weeks of pregnancy.  When labor starts, or if your water breaks, call your health care provider or nurse care line. Based on your situation, they will determine when you should go in for an exam. This information is not intended to replace advice given to you by your health care provider. Make sure you discuss any questions you have with your health care provider. Document Released: 11/15/2016 Document Revised: 03/10/2017 Document Reviewed: 11/15/2016 Elsevier Patient Education  2020 [place].  Fetal Movement Counts Patient Name: ________________________________________________ Patient Due Date: ____________________ What is a fetal movement count?  A fetal movement count is the number of times that you feel your baby move during a certain amount of time. This may also be called a fetal kick count. A fetal movement count is recommended for every pregnant woman. You may  be asked to start counting fetal movements as early as week 28 of your pregnancy. Pay attention to when your baby is most active. You may notice your baby's sleep and wake cycles. You may also notice things that make your baby move more. You should do a fetal movement count:  When your baby is normally most active.  At the same time each day. A good time to count movements is while you are resting, after having something to eat and drink. How do I count fetal movements? 1. Find a quiet, comfortable area. Sit, or lie down on your side. 2. Write down the date, the start time and stop time, and the number of movements that you felt between those two times. Take this information with you to your health care visits. 3. For 2 hours, count kicks, flutters, swishes, rolls, and jabs. You should feel at least 10 movements during 2 hours. 4. You may stop counting after you have felt 10 movements. 5. If you do not feel 10 movements in 2  hours, have something to eat and drink. Then, keep resting and counting for 1 hour. If you feel at least 4 movements during that hour, you may stop counting. Contact a health care provider if:  You feel fewer than 4 movements in 2 hours.  Your baby is not moving like he or she usually does. Date: ____________ Start time: ____________ Stop time: ____________ Movements: ____________ Date: ____________ Start time: ____________ Stop time: ____________ Movements: ____________ Date: ____________ Start time: ____________ Stop time: ____________ Movements: ____________ Date: ____________ Start time: ____________ Stop time: ____________ Movements: ____________ Date: ____________ Start time: ____________ Stop time: ____________ Movements: ____________ Date: ____________ Start time: ____________ Stop time: ____________ Movements: ____________ Date: ____________ Start time: ____________ Stop time: ____________ Movements: ____________ Date: ____________ Start time: ____________ Stop  time: ____________ Movements: ____________ Date: ____________ Start time: ____________ Stop time: ____________ Movements: ____________ This information is not intended to replace advice given to you by your health care provider. Make sure you discuss any questions you have with your health care provider. Document Released: 07/10/2006 Document Revised: 06/30/2018 Document Reviewed: 07/20/2015 Elsevier Patient Education  2020 [employer].

## 2019-04-05 NOTE — Progress Notes (Signed)
   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  She is currently monitored for the following issues for this low-risk pregnancy and has Tinea pedis of left foot; Uterine adenomyoma; Supervision of other normal pregnancy, antepartum; Ankylosing spondylitis ([place]); Language barrier; History of cesarean delivery; Anemia in pregnancy; and Rudimentary uterine horn on their problem list.  Patient reports vaginal discharge. Reports thin white discharge with foul odor for the last few weeks. No itching or irritation. .  Contractions: Not present. Vag. Bleeding: None.  Movement: (!) Decreased. Has noted decreased movement for the last 3 weeks. Feels about 4 movements per day. Today has felt baby move twice.   Denies leaking of fluid.   The following portions of the patient's history were reviewed and updated as appropriate: allergies, current medications, past family history, past medical history, past social history, past surgical history and problem list.   Objective:   Vitals:   04/05/19 0947  BP: 102/69  Pulse: (!) 101  Temp: 98.2 F (36.8 C)  Weight: 191 lb (86.6 kg)    Fetal Status: Fetal Heart Rate (bpm): 152 Fundal Height: 35 cm Movement: (!) Decreased     General:  Alert, oriented and cooperative. Patient is in no acute distress.  Skin: Skin is warm and dry. No rash noted.   Cardiovascular: Normal heart rate noted  Respiratory: Normal respiratory effort, no problems with respiration noted  Abdomen: Soft, gravid, appropriate for gestational age.  Pain/Pressure: Present     Pelvic: Cervical exam deferred        Extremities: Normal range of motion.  Edema: Trace  Mental Status: Normal mood and affect. Normal behavior. Normal judgment and thought content.   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 Vaginal odor  - Cervicovaginal ancillary only( [place])  2. Vaginal discharge during  pregnancy, antepartum  - Cervicovaginal ancillary only( [place])  3. Anemia during pregnancy in third trimester  - CBC  4. Supervision of other normal pregnancy, antepartum -Pt for repeat c/section on 11/9. Will collect GBS today as patient is already getting swab collected for vaginal discharge.  - Culture, beta strep (group b only)  5. Language barrier -Arabic interpreter at bedside  6. Decreased fetal movements in third trimester, single or unspecified fetus -pt for ob f/u ultrasound today due to low fluid level. Discussed with Dr. Donalee Citrin. Will get NST prior to ultrasound.  -reactive NST  Term labor symptoms and general obstetric precautions including but not limited to vaginal bleeding, contractions, leaking of fluid and fetal movement were reviewed in detail with the patient. Please refer to After Visit Summary for other counseling recommendations.   Return in about 2 weeks (around 04/19/2019) for Routine OB in person.  Future Appointments  Date Time Provider [place]  04/05/2019  1:15 PM [place] [facility]  04/05/2019  1:15 PM [place] [facility]  04/12/2019  9:40 AM Ofilia Neas, PA-C [facility] None    Jorje Guild, NP

## 2019-04-05 NOTE — Progress Notes (Deleted)
Office Visit Note  Patient: Kathleen Nguyen             Date of Birth: 1986/02/12           MRN: [id number]             PCP: Clent Demark, PA-C Referring: Clent Demark, PA-C Visit Date: 04/12/2019 Occupation: @GUAROCC @  Subjective:  No chief complaint on file.  Cimzia 200 mg every 14 days. Last TB gold negative on 12/28/2018.  Due for TB gold today and will monitor yearly.  Most recent CBC/CMP within normal limits except for low hemoglobin, alk phos, and creatinine on 02/22/2019.  History of Present Illness: Kathleen Nguyen is a 33 y.o. female ***   Activities of Daily Living:  Patient reports morning stiffness for *** {minute/hour:19697}.   Patient {ACTIONS;DENIES/REPORTS:21021675::"Denies"} nocturnal pain.  Difficulty dressing/grooming: {ACTIONS;DENIES/REPORTS:21021675::"Denies"} Difficulty climbing stairs: {ACTIONS;DENIES/REPORTS:21021675::"Denies"} Difficulty getting out of chair: {ACTIONS;DENIES/REPORTS:21021675::"Denies"} Difficulty using hands for taps, buttons, cutlery, and/or writing: {ACTIONS;DENIES/REPORTS:21021675::"Denies"}  No Rheumatology ROS completed.   PMFS History:  Patient Active Problem List   Diagnosis Date Noted   Rudimentary uterine horn 01/01/2019   Anemia in pregnancy 12/07/2018   Language barrier 12/04/2018   History of cesarean delivery 12/04/2018   Ankylosing spondylitis ([place]) 11/11/2018   Supervision of other normal pregnancy, antepartum 05/18/2018   Uterine adenomyoma 12/11/2016   Tinea pedis of left foot 12/05/2016    Past Medical History:  Diagnosis Date   Ankylosing spondylitis ([facility])    Ankylosing spondylitis (HCC)    Rheumatoid arthritis ([place])     Family History  Problem Relation Age of Onset   Arthritis/Rheumatoid Mother    Past Surgical History:  Procedure Laterality Date   CESAREAN SECTION     CESAREAN SECTION     Social History   Social History Narrative   Not on file   Immunization History    Administered Date(s) Administered   Influenza,inj,Quad PF,6+ Mos 02/22/2019   Tdap 12/18/2017, 02/22/2019     Objective: Vital Signs: LMP 08/03/2018    Physical Exam   Musculoskeletal Exam: ***  CDAI Exam: CDAI Score: -- Patient Global: --; Provider Global: -- Swollen: --; Tender: -- Joint Exam   No joint exam has been documented for this visit   There is currently no information documented on the homunculus. Go to the Rheumatology activity and complete the homunculus joint exam.  Investigation: No additional findings.  Imaging: [place] Mfm Ob Follow Up  Result Date: 03/15/2019 ----------------------------------------------------------------------  OBSTETRICS REPORT                       (Signed Final 03/15/2019 09:03 am) ---------------------------------------------------------------------- Patient Info  ID #:       [id number]                          D.O.B.:  [birth year] (33 yrs)  Name:       Kathleen Nguyen                   Visit Date: 03/15/2019 07:42 am ---------------------------------------------------------------------- Performed By  Performed By:     Georgie Chard        Ref. Address:     [address]  [place], [place]  Attending:        Johnell Comings MD         [facility]  Referred By:      Emily Filbert MD ---------------------------------------------------------------------- Orders   #  Description                          Code         Ordered By   1  [place] MFM OB FOLLOW UP                  97673.41     MYRA DOVE  ----------------------------------------------------------------------   #  Order #                    Accession #                 Episode #   1  [id number]                   [id number]                  [id number]  ---------------------------------------------------------------------- Indications   Obesity complicating pregnancy, third          O99.213   trimester   Anemia during pregnancy in third trimester     O99.013   Previous cesarean delivery, antepartum         O34.219   [redacted] weeks gestation of pregnancy                Z3A.32  ---------------------------------------------------------------------- Vital Signs  Weight (lb): 189                               Height:        5'5"  BMI:         31.45 ---------------------------------------------------------------------- Fetal Evaluation  Num Of Fetuses:         1  Fetal Heart Rate(bpm):  155  Cardiac Activity:       Observed  Presentation:           Cephalic  Placenta:               Anterior  P. Cord Insertion:      Previously Visualized  Amniotic Fluid  AFI FV:      Within normal limits  AFI Sum(cm)     %Tile       Largest Pocket(cm)  10              16          3.89  RUQ(cm)       RLQ(cm)       LUQ(cm)        LLQ(cm)  3.89          0             3.44           0 ---------------------------------------------------------------------- Biometry  BPD:      78.8  mm     G. Age:  31w 4d         30  %    CI:        74.31   %    70 - 86                                                          FL/HC:      20.9   %    19.1 - 21.3  HC:      290.2  mm     G. Age:  32w 0d         14  %    HC/AC:      1.01        0.96 - 1.17  AC:      288.3  mm     G. Age:  32w 6d         73  %    FL/BPD:     76.9   %    71 - 87  FL:       60.6  mm     G. Age:  31w 4d         24  %    FL/AC:      21.0   %    20 - 24  HUM:      53.2  mm     G. Age:  31w 0d         30  %  Est. FW:    1934  gm      4 lb 4 oz     47  % ---------------------------------------------------------------------- OB History  Gravidity:    4         Term:   2        Prem:   0        SAB:   1  TOP:          0       Ectopic:  0        Living: 2  ---------------------------------------------------------------------- Gestational Age  LMP:           32w 0d        Date:  08/03/18                 EDD:   05/10/19  U/S Today:     32w 0d  EDD:   05/10/19  Best:          Milderd Meager 0d     Det. By:  LMP  (08/03/18)          EDD:   05/10/19 ---------------------------------------------------------------------- Anatomy  Thoracic:              Appears normal         Abdomen:                Appears normal  Heart:                 Appears normal         Kidneys:                Appear normal                         (4CH, axis, and                         situs)  Diaphragm:             Appears normal         Bladder:                Appears normal  Stomach:               Appears normal, left                         sided ---------------------------------------------------------------------- Comments  This patient was seen for a follow up growth scan due to  anemia in pregnancy.  She denies any other problems in her  current pregnancy.  She was informed that the fetal growth appears appropriate  for her gestational age.  The amniotic fluid level appeared to  be subjectively on the lower normal end.  The total AFI was  10 cm after fetal movement.  A 2 x 3 cm pocket of cord free  amniotic fluid was noted today.  Due to the lower amniotic fluid level, a follow-up exam was  scheduled in 3 weeks. ----------------------------------------------------------------------                   Johnell Comings, MD Electronically Signed Final Report   03/15/2019 09:03 am ----------------------------------------------------------------------   Recent Labs: Lab Results  Component Value Date   WBC 6.1 02/22/2019   HGB 9.6 (L) 02/22/2019   PLT 310 02/22/2019   NA 139 02/22/2019   K 4.0 02/22/2019   CL 103 02/22/2019   CO2 20 02/22/2019   GLUCOSE 77 02/22/2019   BUN 6 02/22/2019   CREATININE 0.36 (L) 02/22/2019   BILITOT <0.2 02/22/2019   ALKPHOS 35 (L)  02/22/2019   AST 11 02/22/2019   ALT 7 02/22/2019   PROT 6.2 02/22/2019   ALBUMIN 3.5 (L) 02/22/2019   CALCIUM 8.7 02/22/2019   GFRAA 164 02/22/2019   QFTBGOLDPLUS NEGATIVE 12/28/2018    Speciality Comments: No specialty comments available.  Procedures:  No procedures performed Allergies: Patient has no known allergies.   Assessment / Plan:     Visit Diagnoses: No diagnosis found.  Orders: No orders of the defined types were placed in this encounter.  No orders of the defined types were placed in this encounter.   Face-to-face time spent with patient was *** minutes. Greater than 50% of time was spent in counseling and coordination of care.  Follow-Up  Instructions: No follow-ups on file.   Earnestine Mealing, CMA  Note - This record has been created using Editor, commissioning.  Chart creation errors have been sought, but may not always  have been located. Such creation errors do not reflect on  the standard of medical care.

## 2019-04-05 NOTE — Telephone Encounter (Signed)
I have made transport arrangement for Kathleen Nguyen to be picked and dropped at the providers office on [place]. Honor Loh RN BSN PCCN  [phone]

## 2019-04-06 ENCOUNTER — Telehealth: Payer: Self-pay | Admitting: *Deleted

## 2019-04-06 NOTE — Telephone Encounter (Signed)
Naima left a voice message asking for blood tests results.  I called Jahara with [employer] [phone] and confirmed  She wants to know CBC results which I gave her. I also informed her she should continue taking iron as ordered. She also asked for GBS result and I informed her that result is not yet available- will take longer because is culture. She voices understanding. Blondie Riggsbee,RN

## 2019-04-09 LAB — CULTURE, BETA STREP (GROUP B ONLY): Strep Gp B Culture: NEGATIVE

## 2019-04-12 ENCOUNTER — Inpatient Hospital Stay (HOSPITAL_COMMUNITY): Admission: RE | Admit: 2019-04-12 | Payer: BC Managed Care – PPO | Source: Ambulatory Visit

## 2019-04-12 ENCOUNTER — Ambulatory Visit: Payer: BC Managed Care – PPO | Admitting: Physician Assistant

## 2019-04-12 LAB — CERVICOVAGINAL ANCILLARY ONLY
Bacterial Vaginitis (gardnerella): NEGATIVE
Candida Glabrata: NEGATIVE
Candida Vaginitis: NEGATIVE
Chlamydia: NEGATIVE
Comment: NEGATIVE
Comment: NEGATIVE
Comment: NEGATIVE
Comment: NEGATIVE
Comment: NORMAL
Neisseria Gonorrhea: NEGATIVE

## 2019-04-14 NOTE — Progress Notes (Signed)
Office Visit Note  Patient: Kathleen Nguyen             Date of Birth: 23-Jul-1985           MRN: [id number]             PCP: Clent Demark, PA-C Referring: Clent Demark, PA-C Visit Date: 04/19/2019 Occupation: @GUAROCC @  Subjective:  Joint stiffness    History of Present Illness: Kathleen Nguyen is a 33 y.o. female with history of ankylosing spondylitis.  Patient has been holding Cimzia for the past 2 months.  She is scheduled for a C-section on 05/03/2019.  She denies any recent flares.  She states that she has noticed some edema in both hands and both feet but denies any joint swelling.  She experiences joint stiffness lasting all day but it is worse in the morning.  She denies any SI joint pain or lower back pain at this time.  She denies any neck pain or stiffness.  Activities of Daily Living:  Patient reports joint stiffness  Patient Denies nocturnal pain.  Difficulty dressing/grooming: Denies Difficulty climbing stairs: Denies Difficulty getting out of chair: Denies Difficulty using hands for taps, buttons, cutlery, and/or writing: Denies  Review of Systems  Constitutional: Negative for fatigue.  HENT: Positive for mouth dryness. Negative for mouth sores and nose dryness.   Eyes: Positive for dryness. Negative for pain and visual disturbance.  Respiratory: Negative for cough, hemoptysis, shortness of breath and difficulty breathing.   Cardiovascular: Negative for chest pain, palpitations, hypertension and swelling in legs/feet.  Gastrointestinal: Negative for blood in stool, constipation and diarrhea.  Endocrine: Negative for increased urination.  Genitourinary: Negative for painful urination.  Musculoskeletal: Positive for morning stiffness. Negative for arthralgias, joint pain, joint swelling, myalgias, muscle weakness, muscle tenderness and myalgias.  Skin: Negative for color change, pallor, rash, hair loss, nodules/bumps, skin tightness, ulcers and sensitivity to  sunlight.  Allergic/Immunologic: Negative for susceptible to infections.  Neurological: Negative for dizziness, numbness, headaches and weakness.  Hematological: Negative for swollen glands.  Psychiatric/Behavioral: Negative for depressed mood and sleep disturbance. The patient is not nervous/anxious.     PMFS History:  Patient Active Problem List   Diagnosis Date Noted  . 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 Anemia in pregnancy 12/07/2018  . Language barrier 12/04/2018  . History of cesarean delivery 12/04/2018  . Ankylosing spondylitis ([place]) 11/11/2018  . Supervision of other normal pregnancy, antepartum 05/18/2018  . Uterine adenomyoma 12/11/2016  . Tinea pedis of left foot 12/05/2016    Past Medical History:  Diagnosis Date  . Ankylosing spondylitis ([place])   . Ankylosing spondylitis ([place])   . Rheumatoid arthritis ([place])     Family History  Problem Relation Age of Onset  . Arthritis/Rheumatoid Mother    Past Surgical History:  Procedure Laterality Date  . CESAREAN SECTION    . CESAREAN SECTION     Social History   Social History Narrative  . Not on file   Immunization History  Administered Date(s) Administered  . Influenza,inj,Quad PF,6+ Mos 02/22/2019  . Tdap 12/18/2017, 02/22/2019     Objective: Vital Signs: BP 104/64 (BP Location: Left Arm, Patient Position: Sitting, Cuff Size: Normal)   Pulse 99   Resp 14   Ht 5\' 5"  (1.651 m)   Wt 195 lb (88.5 kg)   LMP 08/03/2018   BMI 32.45 kg/m    Physical Exam Vitals signs and nursing note reviewed.  Constitutional:      Appearance: She  is well-developed.  HENT:     Head: Normocephalic and atraumatic.  Eyes:     Conjunctiva/sclera: Conjunctivae normal.  Neck:     Musculoskeletal: Normal range of motion.  Cardiovascular:     Rate and Rhythm: Normal rate and regular rhythm.     Heart sounds: Normal heart sounds.  Pulmonary:     Effort: Pulmonary effort is normal.     Breath sounds: Normal breath sounds.   Abdominal:     General: Bowel sounds are normal.     Palpations: Abdomen is soft.  Lymphadenopathy:     Cervical: No cervical adenopathy.  Skin:    General: Skin is warm and dry.     Capillary Refill: Capillary refill takes less than 2 seconds.  Neurological:     Mental Status: She is alert and oriented to person, place, and time.  Psychiatric:        Behavior: Behavior normal.      Musculoskeletal Exam: C-spine, thoracic spine, and lumbar spine good ROM.  No midline spinal tenderness.  No SI joint tenderness.  Shoulder joints, elbow joints, wrist joints, MCPs, PIPs, and DIPs good ROM with no synovitis.  Complete fist formation bilaterally.  Hip joints, knee joints, ankle joints, MTPs, PIPs, and DIPs good ROM with no synovitis.  No warmth or effusion of knee joints.  No tenderness or swelling of ankle joints.  Pedal edema bilaterally.   CDAI Exam: CDAI Score: - Patient Global: -; Provider Global: - Swollen: -; Tender: - Joint Exam   No joint exam has been documented for this visit   There is currently no information documented on the homunculus. Go to the Rheumatology activity and complete the homunculus joint exam.  Investigation: No additional findings.  Imaging: [place] Mfm Ob Follow Up  Result Date: 04/05/2019 ----------------------------------------------------------------------  OBSTETRICS REPORT                       (Signed Final 04/05/2019 01:59 pm) ---------------------------------------------------------------------- Patient Info  ID #:       [id number]                          D.O.B.:  03-03-1986 (33 yrs)  Name:       Kathleen Nguyen                   Visit Date: 04/05/2019 01:16 pm ---------------------------------------------------------------------- Performed By  Performed By:     Berlinda Last          Ref. Address:     [address]                    [place], [place]  [place]  Attending:        Johnell Comings MD         Location:         [facility]                                                             [facility]  Referred By:      Emily Filbert MD ---------------------------------------------------------------------- Orders   #  Description                          Code         Ordered By   1  [place] MFM OB FOLLOW UP                  [phone]     YU FANG  ----------------------------------------------------------------------   #  Order #                    Accession #                 Episode #   1  [id number]                  [id number]                  [id number]  ---------------------------------------------------------------------- Indications   Obesity complicating pregnancy, third          O99.213   trimester   Anemia during pregnancy in third trimester     O99.013   Previous cesarean delivery, antepartum x 2     [id number]   Medical complication of pregnancy              O26.90   (ankylosing spondylitis)   [redacted] weeks gestation of pregnancy                Z3A.35  ---------------------------------------------------------------------- Vital Signs                                                 Height:        5'5" ---------------------------------------------------------------------- Fetal Evaluation  Num Of Fetuses:         1  Fetal Heart Rate(bpm):  143  Cardiac Activity:       Observed  Presentation:           Cephalic  Placenta:               Anterior  P. Cord Insertion:      Previously Visualized  Amniotic Fluid  AFI FV:      Within normal limits  AFI Sum(cm)     %Tile       Largest Pocket(cm)  8.01            6           2.78  RUQ(cm)       RLQ(cm)       LUQ(cm)        LLQ(cm)  2.78          2.27  1.29           1.67 ---------------------------------------------------------------------- Biometry  BPD:      86.7  mm     G. Age:  35w 0d         52  %    CI:        72.72   %    70 - 86                                                           FL/HC:      20.8   %    20.1 - 22.3  HC:      323.3  mm     G. Age:  36w 4d         55  %    HC/AC:      0.98        0.93 - 1.11  AC:      330.6  mm     G. Age:  37w 0d         95  %    FL/BPD:     77.6   %    71 - 87  FL:       67.3  mm     G. Age:  34w 4d         32  %    FL/AC:      20.4   %    20 - 24  Est. FW:    2853  gm      6 lb 5 oz     78  % ---------------------------------------------------------------------- OB History  Gravidity:    4         Term:   2        Prem:   0        SAB:   1  TOP:          0       Ectopic:  0        Living: 2 ---------------------------------------------------------------------- Gestational Age  LMP:           35w 0d        Date:  08/03/18                 EDD:   05/10/19  U/S Today:     35w 6d                                        EDD:   05/04/19  Best:          35w 0d     Det. By:  LMP  (08/03/18)          EDD:   05/10/19 ---------------------------------------------------------------------- Anatomy  Cranium:               Appears normal         Aortic Arch:            Previously seen  Cavum:                 Appears normal         Ductal Arch:  Previously seen  Ventricles:            Appears normal         Diaphragm:              Previously seen  Choroid Plexus:        Previously seen        Stomach:                Appears normal, left                                                                        sided  Cerebellum:            Previously seen        Abdomen:                Appears normal  Posterior Fossa:       Previously seen        Abdominal Wall:         Previously seen  Nuchal Fold:           Previously seen        Cord Vessels:           Previously seen  Face:                  Orbits and profile     Kidneys:                Appear normal                         previously seen  Lips:                  Previously seen        Bladder:                Appears normal  Thoracic:              Appears normal          Spine:                  Limited views                                                                        appear normal  Heart:                 Previously seen        Upper Extremities:      Previously seen  RVOT:                  Appears normal         Lower Extremities:      Previously seen  LVOT:                  Appears normal  Other:  Female gender Technically difficult due to fetal position. ---------------------------------------------------------------------- Cervix  Uterus Adnexa  Cervix  Not visualized (advanced GA >24wks) ---------------------------------------------------------------------- Comments  This patient was seen for a follow up growth scan due to  maternal anemia and lower normal amniotic fluid noted  during her last exam.  The patient denies any problems since  her last exam and denies any leakage of fluid.  She was informed that the fetal growth was appropriate for  her gestational age.  Lower normal amniotic fluid (8 cm) is  noted on today's exam.  The patient was advised to continue to monitor for signs of  any leakage of fluid.  Follow-up as indicated. ----------------------------------------------------------------------                   Johnell Comings, MD Electronically Signed Final Report   04/05/2019 01:59 pm ----------------------------------------------------------------------   Recent Labs: Lab Results  Component Value Date   WBC 5.4 04/05/2019   HGB 10.8 (L) 04/05/2019   PLT 217 04/05/2019   NA 139 02/22/2019   K 4.0 02/22/2019   CL 103 02/22/2019   CO2 20 02/22/2019   GLUCOSE 77 02/22/2019   BUN 6 02/22/2019   CREATININE 0.36 (L) 02/22/2019   BILITOT <0.2 02/22/2019   ALKPHOS 35 (L) 02/22/2019   AST 11 02/22/2019   ALT 7 02/22/2019   PROT 6.2 02/22/2019   ALBUMIN 3.5 (L) 02/22/2019   CALCIUM 8.7 02/22/2019   GFRAA 164 02/22/2019   QFTBGOLDPLUS NEGATIVE 12/28/2018    Speciality Comments: No specialty comments available.  Procedures:  No  procedures performed Allergies: Patient has no known allergies.   Assessment / Plan:     Visit Diagnoses: Ankylosing spondylitis of lumbosacral region [facility]): She has not had any signs or symptoms of a flare recently.  She has no joint pain or joint swelling at this time.  She has joint stiffness all day, which is worse in the morning.  She has good ROM of the C-spine, thoracic spine, and lumbar spine with no discomfort on exam.  She has no SI joint tenderness or midline spinal tenderness.  She has been holding Cimzia for 2 months, and she is scheduled for a C-section on 05/03/19.  She will restart on Cimzia after following up in our office 1 month postpartum. She was advised to notify us if she develops increased joint pain or joint swelling.   High risk medication use - She is currently holding Cimzia.  She is scheduled for a C-section on 05/03/19.   She will follow up in the office prior to restating on cimzia.  She was advised to restart on Cimzia 1 month post-partum.   Sacroiliitis ([place]): She has no SI joint tenderness on exam.   Dry eyes: She has chronic eye dryness but no conjunctival injection was noted.   Other medical conditions are listed as follows:   Hair loss  Language barrier  Miscarriage  Tinea pedis of left foot  Uterine adenomyoma  Orders: No orders of the defined types were placed in this encounter.  No orders of the defined types were placed in this encounter.     Follow-Up Instructions: Return in about 6 weeks (around 05/31/2019) for Ankylosing Spondylitis.   Ofilia Neas, PA-C   I examined and evaluated the patient with Hazel Sams PA.  Patient is clinically doing well currently.  We discussed the possibility of flare in the postpartum period.  She will resume Cimzia after the baby is born if there is no risk of infection.  She can get clearance from her  GYN.  The plan of care was discussed as noted above.  Bo Merino, MD  Note - This record has been  created using Editor, commissioning.  Chart creation errors have been sought, but may not always  have been located. Such creation errors do not reflect on  the standard of medical care.

## 2019-04-19 ENCOUNTER — Other Ambulatory Visit: Payer: Self-pay

## 2019-04-19 ENCOUNTER — Ambulatory Visit (INDEPENDENT_AMBULATORY_CARE_PROVIDER_SITE_OTHER): Payer: BC Managed Care – PPO | Admitting: Rheumatology

## 2019-04-19 ENCOUNTER — Encounter: Payer: Self-pay | Admitting: Rheumatology

## 2019-04-19 VITALS — BP 104/64 | HR 99 | Resp 14 | Ht 65.0 in | Wt 195.0 lb

## 2019-04-19 DIAGNOSIS — O039 Complete or unspecified spontaneous abortion without complication: Secondary | ICD-10-CM

## 2019-04-19 DIAGNOSIS — H04123 Dry eye syndrome of bilateral lacrimal glands: Secondary | ICD-10-CM

## 2019-04-19 DIAGNOSIS — M457 Ankylosing spondylitis of lumbosacral region: Secondary | ICD-10-CM

## 2019-04-19 DIAGNOSIS — Z79899 Other long term (current) drug therapy: Secondary | ICD-10-CM | POA: Diagnosis not present

## 2019-04-19 DIAGNOSIS — M461 Sacroiliitis, not elsewhere classified: Secondary | ICD-10-CM

## 2019-04-19 DIAGNOSIS — D269 Other benign neoplasm of uterus, unspecified: Secondary | ICD-10-CM | POA: Diagnosis not present

## 2019-04-19 DIAGNOSIS — B353 Tinea pedis: Secondary | ICD-10-CM

## 2019-04-19 DIAGNOSIS — Z789 Other specified health status: Secondary | ICD-10-CM | POA: Diagnosis not present

## 2019-04-19 DIAGNOSIS — L659 Nonscarring hair loss, unspecified: Secondary | ICD-10-CM

## 2019-04-19 NOTE — Progress Notes (Signed)
Subjective:  Kathleen Nguyen is a 33 y.o. [id number] at [redacted]w[redacted]d being seen today for ongoing prenatal care.  She is currently monitored for the following issues for this high-risk pregnancy and has Tinea pedis of left foot; Uterine adenomyoma; Supervision of other normal pregnancy, antepartum; Ankylosing spondylitis ([place]); Language barrier; History of cesarean delivery; Anemia in pregnancy; and Rudimentary uterine horn on their problem list. Arabic interpreter used for entire visit.  Pt aware C/S scheduled for 05/03/2019.  Patient reports no complaints.  Contractions: Not present. Vag. Bleeding: None.  Movement: Present. Denies leaking of fluid.   The following portions of the patient's history were reviewed and updated as appropriate: allergies, current medications, past family history, past medical history, past social history, past surgical history and problem list. Problem list updated.  Objective:   Vitals:   04/20/19 0951  BP: 100/65  Pulse: 96  Temp: 98.3 F (36.8 C)  Weight: 195 lb 8 oz (88.7 kg)    Fetal Status: Fetal Heart Rate (bpm):  155 Fundal Height: 38 cm Movement: Present     General:  Alert, oriented and cooperative. Patient is in no acute distress.  Skin: Skin is warm and dry. No rash noted.   Cardiovascular: Normal heart rate noted  Respiratory: Normal respiratory effort, no problems with respiration noted  Abdomen: Soft, gravid, appropriate for gestational age. Pain/Pressure: Present     Pelvic: Vag. Bleeding: None     Cervical exam performed Dilation: Closed Effacement (%): 0 Station: Ballotable  Extremities: Normal range of motion.  Edema: Trace  Mental Status: Normal mood and affect. Normal behavior. Normal judgment and thought content.    Assessment and Plan:  Pregnancy: [id number] at [redacted]w[redacted]d  1. Supervision of other normal pregnancy, antepartum - discussed visitor policy in OR for C/S in light of COVID - GBS/GC/CT performed today - pt reports difficulty with  cracked nipples when breastfeeding in the past, discussed lactation referral and information given - pt reports is considering patch vs. IUD, pt advised to discuss use of estrogen with rheumatologist at next visit -dental paper given for upcoming dental appointment  2. Ankylosing spondylitis, unspecified site of spine ([place]) -pt had appointment yesterday with rheumatology, per note will restart on Cimzia at 72mo ppartum visit with rheumatology and is to notify their office with increased joint pain or swelling -pt will return for f/u visit in 6 weeks  3. History of cesarean delivery x2 -C/S scheduled for 05/03/2019  4. Anemia during pregnancy in third trimester -CBC two weeks ago shows hgb improved to 10.8 -pt to continue taking iron as prescribed  5. Language barrier -Arabic interpreter used for entire visit  Term labor symptoms and general obstetric precautions including but not limited to vaginal bleeding, contractions, leaking of fluid and fetal movement were reviewed in detail with the patient. Please refer to After Visit Summary for other counseling recommendations.  Return in about 1 week (around 04/27/2019) for in-person ROB.   Shavontae Gibeault, Gerrie Nordmann, NP

## 2019-04-20 ENCOUNTER — Ambulatory Visit (INDEPENDENT_AMBULATORY_CARE_PROVIDER_SITE_OTHER): Payer: BC Managed Care – PPO | Admitting: Women's Health

## 2019-04-20 ENCOUNTER — Telehealth: Payer: Self-pay

## 2019-04-20 ENCOUNTER — Encounter: Payer: Self-pay | Admitting: Family Medicine

## 2019-04-20 ENCOUNTER — Other Ambulatory Visit: Payer: Self-pay

## 2019-04-20 ENCOUNTER — Other Ambulatory Visit (HOSPITAL_COMMUNITY)
Admission: RE | Admit: 2019-04-20 | Discharge: 2019-04-20 | Disposition: A | Discharge status: Home / Self Care | Payer: BC Managed Care – PPO | Source: Ambulatory Visit | Attending: Women's Health | Admitting: Women's Health

## 2019-04-20 VITALS — BP 100/65 | HR 96 | Temp 98.3°F | Wt 195.5 lb

## 2019-04-20 DIAGNOSIS — Z3480 Encounter for supervision of other normal pregnancy, unspecified trimester: Secondary | ICD-10-CM

## 2019-04-20 DIAGNOSIS — Z3A37 37 weeks gestation of pregnancy: Secondary | ICD-10-CM

## 2019-04-20 DIAGNOSIS — M459 Ankylosing spondylitis of unspecified sites in spine: Secondary | ICD-10-CM

## 2019-04-20 DIAGNOSIS — Z789 Other specified health status: Secondary | ICD-10-CM

## 2019-04-20 DIAGNOSIS — Z98891 History of uterine scar from previous surgery: Secondary | ICD-10-CM

## 2019-04-20 DIAGNOSIS — O99013 Anemia complicating pregnancy, third trimester: Secondary | ICD-10-CM

## 2019-04-20 NOTE — Patient Instructions (Addendum)
The Maternity Assessment Unit (MAU) is located at the [facility] and [place] at [facility]. The address is: [address], [place], [place], [place] [address]. Please see map below for additional directions.    The Maternity Assessment Unit is designed to help you during your pregnancy, and for up to 6 weeks after delivery, with any pregnancy- or postpartum-related emergencies, if you think you are in labor, or if your water has broken. For example, if you experience nausea and vomiting, vaginal bleeding, severe abdominal or pelvic pain, elevated blood pressure or other problems related to your pregnancy or postpartum time, please come to the Maternity Assessment Unit for assistance.   Signs and Symptoms of Labor Labor is your body's natural process of moving your baby, placenta, and umbilical cord out of your uterus. The process of labor usually starts when your baby is full-term, between 26 and 40 weeks of pregnancy. How will I know when I am close to going into labor? As your body prepares for labor and the birth of your baby, you may notice the following symptoms in the weeks and days before true labor starts:  Having a strong desire to get your home ready to receive your new baby. This is called nesting. Nesting may be a sign that labor is approaching, and it may occur several weeks before birth. Nesting may involve cleaning and organizing your home.  Passing a small amount of thick, bloody mucus out of your vagina (normal bloody show or losing your mucus plug). This may happen more than a week before labor begins, or it might occur right before labor begins as the opening of the cervix starts to widen (dilate). For some women, the entire mucus plug passes at once. For others, smaller portions of the mucus plug may gradually pass over several days.  Your baby moving (dropping) lower in your pelvis to get into position for birth (lightening). When this happens, you may  feel more pressure on your bladder and pelvic bone and less pressure on your ribs. This may make it easier to breathe. It may also cause you to need to urinate more often and have problems with bowel movements.  Having "practice contractions" (Braxton Hicks contractions) that occur at irregular (unevenly spaced) intervals that are more than 10 minutes apart. This is also called false labor. False labor contractions are common after exercise or sexual activity, and they will stop if you change position, rest, or drink fluids. These contractions are usually mild and do not get stronger over time. They may feel like: ? A backache or back pain. ? Mild cramps, similar to menstrual cramps. ? Tightening or pressure in your abdomen. Other early symptoms that labor may be starting soon include:  Nausea or loss of appetite.  Diarrhea.  Having a sudden burst of energy, or feeling very tired.  Mood changes.  Having trouble sleeping. How will I know when labor has begun? Signs that true labor has begun may include:  Having contractions that come at regular (evenly spaced) intervals and increase in intensity. This may feel like more intense tightening or pressure in your abdomen that moves to your back. ? Contractions may also feel like rhythmic pain in your upper thighs or back that comes and goes at regular intervals. ? For first-time mothers, this change in intensity of contractions often occurs at a more gradual pace. ? Women who have given birth before may notice a more rapid progression of contraction changes.  Having a  feeling of pressure in the vaginal area.  Your water breaking (rupture of membranes). This is when the sac of fluid that surrounds your baby breaks. When this happens, you will notice fluid leaking from your vagina. This may be clear or blood-tinged. Labor usually starts within 24 hours of your water breaking, but it may take longer to begin. ? Some women notice this as a gush of  fluid. ? Others notice that their underwear repeatedly becomes damp. Follow these instructions at home:   When labor starts, or if your water breaks, call your health care provider or nurse care line. Based on your situation, they will determine when you should go in for an exam.  When you are in early labor, you may be able to rest and manage symptoms at home. Some strategies to try at home include: ? Breathing and relaxation techniques. ? Taking a warm bath or shower. ? Listening to music. ? Using a heating pad on the lower back for pain. If you are directed to use heat:  Place a towel between your skin and the heat source.  Leave the heat on for 20-30 minutes.  Remove the heat if your skin turns bright red. This is especially important if you are unable to feel pain, heat, or cold. You may have a greater risk of getting burned. Get help right away if:  You have painful, regular contractions that are 5 minutes apart or less.  Labor starts before you are [redacted] weeks along in your pregnancy.  You have a fever.  You have a headache that does not go away.  You have bright red blood coming from your vagina.  You do not feel your baby moving.  You have a sudden onset of: ? Severe headache with vision problems. ? Nausea, vomiting, or diarrhea. ? Chest pain or shortness of breath. These symptoms may be an emergency. If your health care provider recommends that you go to the hospital or birth center where you plan to deliver, do not drive yourself. Have someone else drive you, or call emergency services (911 in the [place] Summary  Labor is your body's natural process of moving your baby, placenta, and umbilical cord out of your uterus.  The process of labor usually starts when your baby is full-term, between 32 and 40 weeks of pregnancy.  When labor starts, or if your water breaks, call your health care provider or nurse care line. Based on your situation, they will determine when you  should go in for an exam. This information is not intended to replace advice given to you by your health care provider. Make sure you discuss any questions you have with your health care provider. Document Released: 11/15/2016 Document Revised: 03/10/2017 Document Reviewed: 11/15/2016 Elsevier Patient Education  2020 [employer]. Cesarean Delivery Cesarean birth, or cesarean delivery, is the surgical delivery of a baby through an incision in the abdomen and the uterus. This may be referred to as a C-section. This procedure may be scheduled ahead of time, or it may be done in an emergency situation. Tell a health care provider about:  Any allergies you have.  All medicines you are taking, including vitamins, herbs, eye drops, creams, and over-the-counter medicines.  Any problems you or family members have had with anesthetic medicines.  Any blood disorders you have.  Any surgeries you have had.  Any medical conditions you have.  Whether you or any members of your family have a history of deep vein thrombosis (DVT)  or pulmonary embolism (PE). What are the risks? Generally, this is a safe procedure. However, problems may occur, including:  Infection.  Bleeding.  Allergic reactions to medicines.  Damage to other structures or organs.  Blood clots.  Injury to your baby. What happens before the procedure? General instructions  Follow instructions from your health care provider about eating or drinking restrictions.  If you know that you are going to have a cesarean delivery, do not shave your pubic area. Shaving before the procedure may increase your risk of infection.  Plan to have someone take you home from the hospital.  Ask your health care provider what steps will be taken to prevent infection. These may include: ? Removing hair at the surgery site. ? Washing skin with a germ-killing soap. ? Taking antibiotic medicine.  Depending on the reason for your cesarean  delivery, you may have a physical exam or additional testing, such as an ultrasound.  You may have your blood or urine tested. Questions for your health care provider  Ask your health care provider about: ? Changing or stopping your regular medicines. This is especially important if you are taking diabetes medicines or blood thinners. ? Your pain management plan. This is especially important if you plan to breastfeed your baby. ? How long you will be in the hospital after the procedure. ? Any concerns you may have about receiving blood products, if you need them during the procedure. ? Cord blood banking, if you plan to collect your baby's umbilical cord blood.  You may also want to ask your health care provider: ? Whether you will be able to hold or breastfeed your baby while you are still in the operating room. ? Whether your baby can stay with you immediately after the procedure and during your recovery. ? Whether a family member or a person of your choice can go with you into the operating room and stay with you during the procedure, immediately after the procedure, and during your recovery. What happens during the procedure?   An IV will be inserted into one of your veins.  Fluid and medicines, such as antibiotics, will be given before the surgery.  Fetal monitors will be placed on your abdomen to check your baby's heart rate.  You may be given a special warming gown to wear to keep your temperature stable.  A catheter may be inserted into your bladder through your urethra. This drains your urine during the procedure.  You may be given one or more of the following: ? A medicine to numb the area (local anesthetic). ? A medicine to make you fall asleep (general anesthetic). ? A medicine (regional anesthetic) that is injected into your back or through a small thin tube placed in your back (spinal anesthetic or epidural anesthetic). This numbs everything below the injection site and  allows you to stay awake during your procedure. If this makes you feel nauseous, tell your health care provider. Medicines will be available to help reduce any nausea you may feel.  An incision will be made in your abdomen, and then in your uterus.  If you are awake during your procedure, you may feel tugging and pulling in your abdomen, but you should not feel pain. If you feel pain, tell your health care provider immediately.  Your baby will be removed from your uterus. You may feel more pressure or pushing while this happens.  Immediately after birth, your baby will be dried and kept warm. You may be  able to hold and breastfeed your baby.  The umbilical cord may be clamped and cut during this time. This usually occurs after waiting a period of 1-2 minutes after delivery.  Your placenta will be removed from your uterus.  Your incisions will be closed with stitches (sutures). Staples, skin glue, or adhesive strips may also be applied to the incision in your abdomen.  Bandages (dressings) may be placed over the incision in your abdomen. The procedure may vary among health care providers and hospitals. What happens after the procedure?  Your blood pressure, heart rate, breathing rate, and blood oxygen level will be monitored until you are discharged from the hospital.  You may continue to receive fluids and medicines through an IV.  You will have some pain. Medicines will be available to help control your pain.  To help prevent blood clots: ? You may be given medicines. ? You may have to wear compression stockings or devices. ? You will be encouraged to walk around when you are able.  Hospital staff will encourage and support bonding with your baby. Your hospital may have you and your baby to stay in the same room (rooming in) during your hospital stay to encourage successful bonding and breastfeeding.  You may be encouraged to cough and breathe deeply often. This helps to prevent  lung problems.  If you have a catheter draining your urine, it will be removed as soon as possible after your procedure. Summary  Cesarean birth, or cesarean delivery, is the surgical delivery of a baby through an incision in the abdomen and the uterus.  Follow instructions from your health care provider about eating or drinking restrictions before the procedure.  You will have some pain after the procedure. Medicines will be available to help control your pain.  Hospital staff will encourage and support bonding with your baby after the procedure. Your hospital may have you and your baby to stay in the same room (rooming in) during your hospital stay to encourage successful bonding and breastfeeding. This information is not intended to replace advice given to you by your health care provider. Make sure you discuss any questions you have with your health care provider. Document Released: 06/10/2005 Document Revised: 12/15/2017 Document Reviewed: 12/15/2017 Elsevier Patient Education  2020 [employer] Pregnancy Proper dental care before, during, and after pregnancy is important for you and your baby. Pregnancy hormones can sometimes cause the gums to swell, which makes it easier for food to become trapped between teeth. The health of your teeth and gums can affect your growing baby. Dental care recommendations To help prevent infection and maintain healthy teeth and gums, a thorough oral examination is recommended for all women during the first trimester of pregnancy. Routine cleanings and examinations are recommended throughout pregnancy. Dental care considerations  Tell your dentist if you are pregnant or you plan to become pregnant.  If you are pregnant, avoid routine X-ray exams until after your baby is born. If you are trying to become pregnant, you do not need to avoid X-rays. ? If you need an emergency procedure that includes a dental X-ray exam during pregnancy, very  low levels of radiation will be used, and lead aprons can be used to protect you from radiation.  Your dentist will discuss the risks and benefits of having dental procedures during pregnancy. If possible, it is best to have dental procedures (such as cavity fillings and crown repair) during the second trimester of pregnancy or after your  baby is born.  If you and your dentist decide to postpone a procedure for any reason, your dentist can recommend treatment to lower the chances of infection until the procedure is performed. This may involve taking certain medicines that are safe to take during pregnancy, such as penicillin or amoxicillin. Follow these instructions at home:   Practice good oral hygiene habits at home: ? Brush your teeth twice a day with fluoride toothpaste. Brush thoroughly for at least 2 minutes. If you have morning sickness, avoid strongly-flavored toothpastes. ? Floss at least once a day.  Visit your dentist to have regular oral exams and cleanings, and if you experience oral problems.  Eat a well-balanced diet that is low in sugar and carbohydrates.  If you vomit, rinse your mouth with water afterward.  Keep all follow-up visits as told by your dentist. This is important. Seek dental care if:  You develop any of the following oral symptoms or they get worse: ? Pain. ? Bleeding. ? Swelling. ? Inflammation.  You develop growths or swelling between teeth. Get help right away if:  You have a fever or chills. Summary  Proper dental care before, during, and after pregnancy is important for you and your baby.  If you are pregnant, routine X-ray exams should be avoided until after your baby is born.  Your dentist will help you consider the risks and benefits of dental procedures during pregnancy.  You should brush your teeth with fluoride toothpaste twice a day and floss at least once a day. This information is not intended to replace advice given to you by your  health care provider. Make sure you discuss any questions you have with your health care provider. Document Released: 11/28/2009 Document Revised: 05/23/2017 Document Reviewed: 05/25/2016 Elsevier Patient Education  [place].    Breastfeeding and Cracked or Sore Nipples Breastfeeding can be challenging, especially during the first few weeks after childbirth. It is normal to have some tenderness in your nipples when you start to breastfeed your new baby, even if you have breastfed before. Cracked or sore nipples are often caused by a poor latch, which refers to the way your baby's mouth attaches to your nipple to breastfeed. Soreness can also happen if your baby is not positioned properly at your breast. How does this affect me? Cracked or sore nipples can be painful. If breastfeeding is too painful or your baby has a poor latch, you may have problems completely emptying your breasts during a feeding. This may lead to problems such as overfilling of your breasts with milk (engorgement), infection, or a decrease in milk supply. How does this affect my baby? If a poor latch is the cause of cracked or sore nipples, your baby may also have problems getting enough breast milk during a feeding. Follow these instructions at home: Breastfeeding strategy   Make sure your baby is latched on to your breast and positioned correctly.  Try different breastfeeding positions to find one that works the best.  Start breastfeeding by having your baby feed from the less sore breast first.  Make sure you break your baby's latch before removing him or her from the breast. To do this: ? Insert your little finger between your nipple and your baby's gums. If you use a breast pump:  Make sure the flange fits properly over your nipple. A poor-fitting flange can cause nipple damage.  Start by setting the pump to a low setting, and gradually increase the pump strength as needed.  Too high of a setting may  cause damage to your nipple. Breast care Ensure that your breasts stay moisturized and healthy by:  Avoiding the use of soap on your nipples.  Wearing a supportive bra. Avoid wearing underwire-style bras or tight bras.  Air drying your nipples for 3-4 minutes after each feeding.  Using only cotton bra pads to absorb any breast milk that leaks. Be sure to change the pads if they become soaked with milk.  Using lanolin on your nipples after nursing. If you use pure lanolin, you do not need to wash it off before feeding your baby again. Pure lanolin is not poisonous (toxic) to your baby.  Massaging some breast milk into your nipples: ? Use your hand to squeeze out a few drops of breast milk (hand express). ? Gently massage the milk into your nipple. ? Let your nipple air-dry. Contact a health care provider if:  You have nipple pain.  You have soreness or cracking that lasts more than 1 week. Summary  It is normal to have some tenderness in your nipples when you start to breastfeed your new baby. However, you should contact your health care provider if you have nipple pain.  Cracked or sore nipples are most often caused by a poor latch or improper positioning.  To help with cracked or sore nipples, make sure your baby is latched on to your breast and positioned correctly, and keep your nipples healthy and moisturized. Use lanolin or expressed milk on your nipples. Avoid washing your nipples with soap. This information is not intended to replace advice given to you by your health care provider. Make sure you discuss any questions you have with your health care provider. Document Released: 01/15/2017 Document Revised: 09/30/2018 Document Reviewed: 01/15/2017 Elsevier Patient Education  2020 [employer].

## 2019-04-20 NOTE — Telephone Encounter (Signed)
Ms Kathleen Nguyen called me requesting transportation to and from the doctors office. I have made arrangements with [facility].She has no other concerns at this time. [name] RN BSN PCCN CNP [phone]

## 2019-04-21 ENCOUNTER — Encounter (HOSPITAL_COMMUNITY): Payer: Self-pay

## 2019-04-21 LAB — GC/CHLAMYDIA PROBE AMP (~~LOC~~) NOT AT ARMC
Chlamydia: NEGATIVE
Comment: NEGATIVE
Comment: NORMAL
Neisseria Gonorrhea: NEGATIVE

## 2019-04-21 NOTE — Pre-Procedure Instructions (Signed)
Interpreter number [phone]

## 2019-04-21 NOTE — Patient Instructions (Signed)
Kathleen Nguyen  04/21/2019   Your procedure is scheduled on:  05/03/2019  Arrive at 57 at Entrance C on [address] at [facility]  and [employer]. You are invited to use the FREE valet parking or use the Visitor's parking deck.  Pick up the phone at the desk and dial [phone].  Call this number if you have problems the morning of surgery: [phone]  Remember:   Do not eat food:(After Midnight) Desps de medianoche.  Do not drink clear liquids: (After Midnight) Desps de medianoche.  Take these medicines the morning of surgery with A SIP OF WATER:  none   Do not wear jewelry, make-up or nail polish.  Do not wear lotions, powders, or perfumes. Do not wear deodorant.  Do not shave 48 hours prior to surgery.  Do not bring valuables to the hospital.  [facility] is not   responsible for any belongings or valuables brought to the hospital.  Contacts, dentures or bridgework may not be worn into surgery.  Leave suitcase in the car. After surgery it may be brought to your room.  For patients admitted to the hospital, checkout time is 11:00 AM the day of              discharge.      Please read over the following fact sheets that you were given:     Preparing for Surgery

## 2019-04-24 LAB — CULTURE, BETA STREP (GROUP B ONLY): Strep Gp B Culture: NEGATIVE

## 2019-04-26 ENCOUNTER — Other Ambulatory Visit: Payer: Self-pay | Admitting: Obstetrics & Gynecology

## 2019-04-27 ENCOUNTER — Other Ambulatory Visit: Payer: Self-pay

## 2019-04-27 ENCOUNTER — Encounter: Payer: Self-pay | Admitting: Medical

## 2019-04-27 ENCOUNTER — Telehealth: Payer: Self-pay

## 2019-04-27 ENCOUNTER — Ambulatory Visit (INDEPENDENT_AMBULATORY_CARE_PROVIDER_SITE_OTHER): Payer: BC Managed Care – PPO | Admitting: Medical

## 2019-04-27 VITALS — BP 102/68 | HR 100 | Temp 98.6°F | Wt 194.0 lb

## 2019-04-27 DIAGNOSIS — Z98891 History of uterine scar from previous surgery: Secondary | ICD-10-CM

## 2019-04-27 DIAGNOSIS — O99013 Anemia complicating pregnancy, third trimester: Secondary | ICD-10-CM

## 2019-04-27 DIAGNOSIS — Z3A38 38 weeks gestation of pregnancy: Secondary | ICD-10-CM

## 2019-04-27 DIAGNOSIS — Z3480 Encounter for supervision of other normal pregnancy, unspecified trimester: Secondary | ICD-10-CM

## 2019-04-27 NOTE — Patient Instructions (Signed)
Fetal Movement Counts Patient Name: ________________________________________________ Patient Due Date: ____________________ What is a fetal movement count?  A fetal movement count is the number of times that you feel your baby move during a certain amount of time. This may also be called a fetal kick count. A fetal movement count is recommended for every pregnant woman. You may be asked to start counting fetal movements as early as week 28 of your pregnancy. Pay attention to when your baby is most active. You may notice your baby's sleep and wake cycles. You may also notice things that make your baby move more. You should do a fetal movement count:  When your baby is normally most active.  At the same time each day. A good time to count movements is while you are resting, after having something to eat and drink. How do I count fetal movements? 1. Find a quiet, comfortable area. Sit, or lie down on your side. 2. Write down the date, the start time and stop time, and the number of movements that you felt between those two times. Take this information with you to your health care visits. 3. For 2 hours, count kicks, flutters, swishes, rolls, and jabs. You should feel at least 10 movements during 2 hours. 4. You may stop counting after you have felt 10 movements. 5. If you do not feel 10 movements in 2 hours, have something to eat and drink. Then, keep resting and counting for 1 hour. If you feel at least 4 movements during that hour, you may stop counting. Contact a health care provider if:  You feel fewer than 4 movements in 2 hours.  Your baby is not moving like he or she usually does. Date: ____________ Start time: ____________ Stop time: ____________ Movements: ____________ Date: ____________ Start time: ____________ Stop time: ____________ Movements: ____________ Date: ____________ Start time: ____________ Stop time: ____________ Movements: ____________ Date: ____________ Start time:  ____________ Stop time: ____________ Movements: ____________ Date: ____________ Start time: ____________ Stop time: ____________ Movements: ____________ Date: ____________ Start time: ____________ Stop time: ____________ Movements: ____________ Date: ____________ Start time: ____________ Stop time: ____________ Movements: ____________ Date: ____________ Start time: ____________ Stop time: ____________ Movements: ____________ Date: ____________ Start time: ____________ Stop time: ____________ Movements: ____________ This information is not intended to replace advice given to you by your health care provider. Make sure you discuss any questions you have with your health care provider. Document Released: 07/10/2006 Document Revised: 06/30/2018 Document Reviewed: 07/20/2015 Elsevier Patient Education  2020 [employer] Braxton Hicks Contractions Contractions of the uterus can occur throughout pregnancy, but they are not always a sign that you are in labor. You may have practice contractions called Braxton Hicks contractions. These false labor contractions are sometimes confused with true labor. What are Braxton Hicks contractions? Braxton Hicks contractions are tightening movements that occur in the muscles of the uterus before labor. Unlike true labor contractions, these contractions do not result in opening (dilation) and thinning of the cervix. Toward the end of pregnancy (32-34 weeks), Braxton Hicks contractions can happen more often and may become stronger. These contractions are sometimes difficult to tell apart from true labor because they can be very uncomfortable. You should not feel embarrassed if you go to the hospital with false labor. Sometimes, the only way to tell if you are in true labor is for your health care provider to look for changes in the cervix. The health care provider will do a physical exam and may monitor your contractions. If you   are not in true labor, the exam should show  that your cervix is not dilating and your water has not broken. If there are no other health problems associated with your pregnancy, it is completely safe for you to be sent home with false labor. You may continue to have Braxton Hicks contractions until you go into true labor. How to tell the difference between true labor and false labor True labor  Contractions last 30-70 seconds.  Contractions become very regular.  Discomfort is usually felt in the top of the uterus, and it spreads to the lower abdomen and low back.  Contractions do not go away with walking.  Contractions usually become more intense and increase in frequency.  The cervix dilates and gets thinner. False labor  Contractions are usually shorter and not as strong as true labor contractions.  Contractions are usually irregular.  Contractions are often felt in the front of the lower abdomen and in the groin.  Contractions may go away when you walk around or change positions while lying down.  Contractions get weaker and are shorter-lasting as time goes on.  The cervix usually does not dilate or become thin. Follow these instructions at home:   Take over-the-counter and prescription medicines only as told by your health care provider.  Keep up with your usual exercises and follow other instructions from your health care provider.  Eat and drink lightly if you think you are going into labor.  If Braxton Hicks contractions are making you uncomfortable: ? Change your position from lying down or resting to walking, or change from walking to resting. ? Sit and rest in a tub of warm water. ? Drink enough fluid to keep your urine pale yellow. Dehydration may cause these contractions. ? Do slow and deep breathing several times an hour.  Keep all follow-up prenatal visits as told by your health care provider. This is important. Contact a health care provider if:  You have a fever.  You have continuous pain in  your abdomen. Get help right away if:  Your contractions become stronger, more regular, and closer together.  You have fluid leaking or gushing from your vagina.  You pass blood-tinged mucus (bloody show).  You have bleeding from your vagina.  You have low back pain that you never had before.  You feel your baby's head pushing down and causing pelvic pressure.  Your baby is not moving inside you as much as it used to. Summary  Contractions that occur before labor are called Braxton Hicks contractions, false labor, or practice contractions.  Braxton Hicks contractions are usually shorter, weaker, farther apart, and less regular than true labor contractions. True labor contractions usually become progressively stronger and regular, and they become more frequent.  Manage discomfort from Braxton Hicks contractions by changing position, resting in a warm bath, drinking plenty of water, or practicing deep breathing. This information is not intended to replace advice given to you by your health care provider. Make sure you discuss any questions you have with your health care provider. Document Released: 10/24/2016 Document Revised: 05/23/2017 Document Reviewed: 10/24/2016 Elsevier Patient Education  2020 Elsevier Inc.  

## 2019-04-27 NOTE — Progress Notes (Signed)
   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  She is currently monitored for the following issues for this high-risk pregnancy and has Tinea pedis of left foot; Uterine adenomyoma; Supervision of other normal pregnancy, antepartum; Ankylosing spondylitis ([place]); Language barrier; History of cesarean delivery; Anemia in pregnancy; and Rudimentary uterine horn on their problem list. Arabic interpreter used.   Patient reports no complaints.  Contractions: Not present. Vag. Bleeding: None.  Movement: Present. Denies leaking of fluid.   The following portions of the patient's history were reviewed and updated as appropriate: allergies, current medications, past family history, past medical history, past social history, past surgical history and problem list.   Objective:   Vitals:   04/27/19 0941  BP: 102/68  Pulse: 100  Temp: 98.6 F (37 C)  Weight: 194 lb (88 kg)    Fetal Status: Fetal Heart Rate (bpm): 139   Movement: Present     General:  Alert, oriented and cooperative. Patient is in no acute distress.  Skin: Skin is warm and dry. No rash noted.   Cardiovascular: Normal heart rate noted  Respiratory: Normal respiratory effort, no problems with respiration noted  Abdomen: Soft, gravid, appropriate for gestational age.  Pain/Pressure: Present     Pelvic: Cervical exam deferred        Extremities: Normal range of motion.  Edema: Trace  Mental Status: Normal mood and affect. Normal behavior. Normal judgment and thought content.   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 Supervision of other normal pregnancy, antepartum - Doing well  2. History of cesarean delivery - Repeat scheduled 05/03/19  3. Anemia during pregnancy in third trimester  Term labor symptoms and general obstetric precautions including but not limited to vaginal bleeding, contractions, leaking of fluid and fetal movement  were reviewed in detail with the patient. Please refer to After Visit Summary for other counseling recommendations.   Return in about 5 weeks (around 06/01/2019) for PP visit.  Future Appointments  Date Time Provider [place]  05/01/2019  8:30 AM [facility] 1 [facility] None  05/31/2019  2:20 PM Ofilia Neas, PA-C [facility] None    Kerry Hough, PA-C

## 2019-04-27 NOTE — Telephone Encounter (Signed)
Ms Nauert called me requesting transportation to [facility]. Same done.She has no other needs or concerns at this time. Earlie Server Elisabetta Mishra RN BSN PCCN CNP [id number]

## 2019-05-01 ENCOUNTER — Other Ambulatory Visit: Payer: Self-pay

## 2019-05-01 ENCOUNTER — Other Ambulatory Visit (HOSPITAL_COMMUNITY)
Admission: RE | Admit: 2019-05-01 | Discharge: 2019-05-01 | Disposition: A | Discharge status: Home / Self Care | Payer: BC Managed Care – PPO | Source: Ambulatory Visit | Attending: Obstetrics & Gynecology | Admitting: Obstetrics & Gynecology

## 2019-05-01 DIAGNOSIS — Z01812 Encounter for preprocedural laboratory examination: Secondary | ICD-10-CM | POA: Diagnosis not present

## 2019-05-01 DIAGNOSIS — Z20828 Contact with and (suspected) exposure to other viral communicable diseases: Secondary | ICD-10-CM | POA: Diagnosis not present

## 2019-05-01 HISTORY — DX: Anemia, unspecified: D64.9

## 2019-05-01 LAB — CBC
HCT: 37.3 % (ref 36.0–46.0)
Hemoglobin: 12.4 g/dL (ref 12.0–15.0)
MCH: 28.6 pg (ref 26.0–34.0)
MCHC: 33.2 g/dL (ref 30.0–36.0)
MCV: 85.9 fL (ref 80.0–100.0)
Platelets: 245 10*3/uL (ref 150–400)
RBC: 4.34 MIL/uL (ref 3.87–5.11)
RDW: 15.9 % — ABNORMAL HIGH (ref 11.5–15.5)
WBC: 5.1 10*3/uL (ref 4.0–10.5)
nRBC: 0 % (ref 0.0–0.2)

## 2019-05-01 LAB — TYPE AND SCREEN
ABO/RH(D): O POS
Antibody Screen: NEGATIVE

## 2019-05-01 LAB — RPR: RPR Ser Ql: NONREACTIVE

## 2019-05-01 LAB — SARS CORONAVIRUS 2 (TAT 6-24 HRS): SARS Coronavirus 2: NEGATIVE

## 2019-05-01 LAB — ABO/RH: ABO/RH(D): O POS

## 2019-05-01 NOTE — MAU Note (Signed)
Pt here for PAT covid swab and lab draw. Denies symptoms. Swab collected. Verbalizes understanding to not remove blood bank bracelet.

## 2019-05-03 ENCOUNTER — Other Ambulatory Visit: Payer: Self-pay

## 2019-05-03 ENCOUNTER — Inpatient Hospital Stay (HOSPITAL_COMMUNITY): Payer: BC Managed Care – PPO | Admitting: Certified Registered Nurse Anesthetist

## 2019-05-03 ENCOUNTER — Inpatient Hospital Stay (HOSPITAL_COMMUNITY)
Admission: RE | Admit: 2019-05-03 | Discharge: 2019-05-05 | DRG: 788 | Disposition: A | Discharge status: Home / Self Care | Payer: BC Managed Care – PPO | Attending: Obstetrics & Gynecology | Admitting: Obstetrics & Gynecology

## 2019-05-03 ENCOUNTER — Encounter (HOSPITAL_COMMUNITY)
Admission: RE | Disposition: A | Discharge status: Home / Self Care | Payer: Self-pay | Source: Home / Self Care | Attending: Obstetrics & Gynecology

## 2019-05-03 ENCOUNTER — Encounter (HOSPITAL_COMMUNITY): Payer: Self-pay | Admitting: *Deleted

## 2019-05-03 DIAGNOSIS — Z789 Other specified health status: Secondary | ICD-10-CM | POA: Diagnosis present

## 2019-05-03 DIAGNOSIS — O9902 Anemia complicating childbirth: Secondary | ICD-10-CM | POA: Diagnosis not present

## 2019-05-03 DIAGNOSIS — O34211 Maternal care for low transverse scar from previous cesarean delivery: Secondary | ICD-10-CM | POA: Diagnosis not present

## 2019-05-03 DIAGNOSIS — D649 Anemia, unspecified: Secondary | ICD-10-CM | POA: Diagnosis not present

## 2019-05-03 DIAGNOSIS — Z98891 History of uterine scar from previous surgery: Secondary | ICD-10-CM

## 2019-05-03 DIAGNOSIS — Z3A39 39 weeks gestation of pregnancy: Secondary | ICD-10-CM

## 2019-05-03 DIAGNOSIS — O99019 Anemia complicating pregnancy, unspecified trimester: Secondary | ICD-10-CM | POA: Diagnosis present

## 2019-05-03 LAB — CREATININE, SERUM
Creatinine, Ser: 0.38 mg/dL — ABNORMAL LOW (ref 0.44–1.00)
GFR calc Af Amer: >60 mL/min (ref >60)
GFR calc non Af Amer: >60 mL/min (ref >60)

## 2019-05-03 LAB — CBC
HCT: 37 % (ref 36.0–46.0)
Hemoglobin: 11.8 g/dL — ABNORMAL LOW (ref 12.0–15.0)
MCH: 28.2 pg (ref 26.0–34.0)
MCHC: 31.9 g/dL (ref 30.0–36.0)
MCV: 88.5 fL (ref 80.0–100.0)
Platelets: 232 10*3/uL (ref 150–400)
RBC: 4.18 MIL/uL (ref 3.87–5.11)
RDW: 15.9 % — ABNORMAL HIGH (ref 11.5–15.5)
WBC: 9.6 10*3/uL (ref 4.0–10.5)
nRBC: 0 % (ref 0.0–0.2)

## 2019-05-03 SURGERY — Surgical Case
Anesthesia: Spinal | Site: Abdomen | Wound class: Clean Contaminated

## 2019-05-03 MED ORDER — KETOROLAC TROMETHAMINE 30 MG/ML IJ SOLN: 30.0000 mg | Freq: Four times a day (QID) | INTRAMUSCULAR | Status: AC | PRN

## 2019-05-03 MED ORDER — SIMETHICONE 80 MG PO CHEW: 80.0000 mg | CHEWABLE_TABLET | ORAL | Status: DC | PRN

## 2019-05-03 MED ORDER — OXYCODONE HCL 5 MG PO TABS: 5.0000 mg | ORAL_TABLET | Freq: Once | ORAL | Status: DC | PRN

## 2019-05-03 MED ORDER — WITCH HAZEL-GLYCERIN EX PADS: 1.0000 "application " | MEDICATED_PAD | CUTANEOUS | Status: DC | PRN

## 2019-05-03 MED ORDER — SCOPOLAMINE 1 MG/3DAYS TD PT72
MEDICATED_PATCH | TRANSDERMAL | Status: AC
  Filled 2019-05-03: qty 1

## 2019-05-03 MED ORDER — CEFAZOLIN SODIUM-DEXTROSE 2-4 GM/100ML-% IV SOLN
2.0000 g | INTRAVENOUS | Status: AC
  Administered 2019-05-03: 2 g via INTRAVENOUS

## 2019-05-03 MED ORDER — SODIUM CHLORIDE 0.9 % IV SOLN
INTRAVENOUS | Status: DC | PRN
  Administered 2019-05-03: 10:00:00 via INTRAVENOUS

## 2019-05-03 MED ORDER — HYDROMORPHONE HCL 1 MG/ML IJ SOLN: 0.2500 mg | INTRAMUSCULAR | Status: DC | PRN

## 2019-05-03 MED ORDER — MORPHINE SULFATE (PF) 0.5 MG/ML IJ SOLN
INTRAMUSCULAR | Status: AC
  Filled 2019-05-03: qty 10

## 2019-05-03 MED ORDER — MEPERIDINE HCL 25 MG/ML IJ SOLN: 6.2500 mg | INTRAMUSCULAR | Status: DC | PRN

## 2019-05-03 MED ORDER — PHENYLEPHRINE HCL-NACL 20-0.9 MG/250ML-% IV SOLN
INTRAVENOUS | Status: DC | PRN
  Administered 2019-05-03: 60 ug/min via INTRAVENOUS

## 2019-05-03 MED ORDER — TETANUS-DIPHTH-ACELL PERTUSSIS 5-2.5-18.5 LF-MCG/0.5 IM SUSP: 0.5000 mL | Freq: Once | INTRAMUSCULAR | Status: DC

## 2019-05-03 MED ORDER — PROMETHAZINE HCL 25 MG/ML IJ SOLN: 6.2500 mg | INTRAMUSCULAR | Status: DC | PRN

## 2019-05-03 MED ORDER — SOD CITRATE-CITRIC ACID 500-334 MG/5ML PO SOLN
30.0000 mL | ORAL | Status: AC
  Administered 2019-05-03: 30 mL via ORAL

## 2019-05-03 MED ORDER — DIPHENHYDRAMINE HCL 25 MG PO CAPS: 25.0000 mg | ORAL_CAPSULE | ORAL | Status: DC | PRN

## 2019-05-03 MED ORDER — BUPIVACAINE HCL (PF) 0.5 % IJ SOLN
INTRAMUSCULAR | Status: DC | PRN
  Administered 2019-05-03: 30 mL

## 2019-05-03 MED ORDER — KETOROLAC TROMETHAMINE 30 MG/ML IJ SOLN: 30.0000 mg | Freq: Once | INTRAMUSCULAR | Status: DC | PRN

## 2019-05-03 MED ORDER — BUPIVACAINE IN DEXTROSE 0.75-8.25 % IT SOLN
INTRATHECAL | Status: DC | PRN
  Administered 2019-05-03: 1.7 mL via INTRATHECAL

## 2019-05-03 MED ORDER — DIPHENHYDRAMINE HCL 50 MG/ML IJ SOLN
12.5000 mg | INTRAMUSCULAR | Status: DC | PRN
  Administered 2019-05-03: 12.5 mg via INTRAVENOUS

## 2019-05-03 MED ORDER — BUPIVACAINE HCL (PF) 0.5 % IJ SOLN
INTRAMUSCULAR | Status: AC
  Filled 2019-05-03: qty 30

## 2019-05-03 MED ORDER — ONDANSETRON HCL 4 MG/2ML IJ SOLN
INTRAMUSCULAR | Status: AC
  Filled 2019-05-03: qty 2

## 2019-05-03 MED ORDER — DIPHENHYDRAMINE HCL 50 MG/ML IJ SOLN
INTRAMUSCULAR | Status: AC
  Filled 2019-05-03: qty 1

## 2019-05-03 MED ORDER — NALBUPHINE HCL 10 MG/ML IJ SOLN
5.0000 mg | INTRAMUSCULAR | Status: DC | PRN
  Filled 2019-05-03: qty 0.5

## 2019-05-03 MED ORDER — NALOXONE HCL 4 MG/10ML IJ SOLN
1.0000 ug/kg/h | INTRAVENOUS | Status: DC | PRN
  Filled 2019-05-03: qty 5

## 2019-05-03 MED ORDER — NALBUPHINE HCL 10 MG/ML IJ SOLN
5.0000 mg | Freq: Once | INTRAMUSCULAR | Status: DC | PRN
  Filled 2019-05-03: qty 0.5

## 2019-05-03 MED ORDER — SIMETHICONE 80 MG PO CHEW
80.0000 mg | CHEWABLE_TABLET | ORAL | Status: DC
  Administered 2019-05-04 (×2): 80 mg via ORAL
  Filled 2019-05-03 (×2): qty 1

## 2019-05-03 MED ORDER — IBUPROFEN 800 MG PO TABS
800.0000 mg | ORAL_TABLET | Freq: Three times a day (TID) | ORAL | Status: DC
  Administered 2019-05-03 – 2019-05-05 (×6): 800 mg via ORAL
  Filled 2019-05-03 (×6): qty 1

## 2019-05-03 MED ORDER — OXYTOCIN 40 UNITS IN NORMAL SALINE INFUSION - SIMPLE MED
2.5000 [IU]/h | INTRAVENOUS | Status: AC
  Administered 2019-05-03: 16:00:00 2.5 [IU]/h via INTRAVENOUS
  Filled 2019-05-03: qty 1000

## 2019-05-03 MED ORDER — ACETAMINOPHEN 325 MG PO TABS
650.0000 mg | ORAL_TABLET | Freq: Four times a day (QID) | ORAL | Status: DC | PRN
  Administered 2019-05-04: 650 mg via ORAL
  Filled 2019-05-03: qty 2

## 2019-05-03 MED ORDER — MORPHINE SULFATE (PF) 0.5 MG/ML IJ SOLN
INTRAMUSCULAR | Status: DC | PRN
  Administered 2019-05-03: .15 mg via INTRATHECAL

## 2019-05-03 MED ORDER — CEFAZOLIN SODIUM-DEXTROSE 2-4 GM/100ML-% IV SOLN
INTRAVENOUS | Status: AC
  Filled 2019-05-03: qty 100

## 2019-05-03 MED ORDER — ACETAMINOPHEN 500 MG PO TABS
1000.0000 mg | ORAL_TABLET | Freq: Four times a day (QID) | ORAL | Status: AC
  Administered 2019-05-03 – 2019-05-04 (×3): 1000 mg via ORAL
  Filled 2019-05-03 (×3): qty 2

## 2019-05-03 MED ORDER — OXYCODONE HCL 5 MG PO TABS: 5.0000 mg | ORAL_TABLET | ORAL | Status: DC | PRN

## 2019-05-03 MED ORDER — PHENYLEPHRINE HCL-NACL 20-0.9 MG/250ML-% IV SOLN
INTRAVENOUS | Status: AC
  Filled 2019-05-03: qty 250

## 2019-05-03 MED ORDER — MENTHOL 3 MG MT LOZG: 1.0000 | LOZENGE | OROMUCOSAL | Status: DC | PRN

## 2019-05-03 MED ORDER — PHENYLEPHRINE HCL (PRESSORS) 10 MG/ML IV SOLN
INTRAVENOUS | Status: DC | PRN
  Administered 2019-05-03 (×2): 80 ug via INTRAVENOUS

## 2019-05-03 MED ORDER — NALOXONE HCL 0.4 MG/ML IJ SOLN: 0.4000 mg | INTRAMUSCULAR | Status: DC | PRN

## 2019-05-03 MED ORDER — COCONUT OIL OIL: 1.0000 "application " | TOPICAL_OIL | Status: DC | PRN

## 2019-05-03 MED ORDER — LACTATED RINGERS IV SOLN
INTRAVENOUS | Status: DC
  Administered 2019-05-03 (×2): via INTRAVENOUS

## 2019-05-03 MED ORDER — PRENATAL MULTIVITAMIN CH
1.0000 | ORAL_TABLET | Freq: Every day | ORAL | Status: DC
  Administered 2019-05-04 – 2019-05-05 (×2): 1 via ORAL
  Filled 2019-05-03 (×2): qty 1

## 2019-05-03 MED ORDER — SIMETHICONE 80 MG PO CHEW
80.0000 mg | CHEWABLE_TABLET | Freq: Three times a day (TID) | ORAL | Status: DC
  Administered 2019-05-04 – 2019-05-05 (×5): 80 mg via ORAL
  Filled 2019-05-03 (×5): qty 1

## 2019-05-03 MED ORDER — SENNOSIDES-DOCUSATE SODIUM 8.6-50 MG PO TABS
2.0000 | ORAL_TABLET | ORAL | Status: DC
  Administered 2019-05-04 (×2): 2 via ORAL
  Filled 2019-05-03 (×2): qty 2

## 2019-05-03 MED ORDER — ONDANSETRON HCL 4 MG/2ML IJ SOLN: 4.0000 mg | Freq: Three times a day (TID) | INTRAMUSCULAR | Status: DC | PRN

## 2019-05-03 MED ORDER — STERILE WATER FOR IRRIGATION IR SOLN
Status: DC | PRN
  Administered 2019-05-03: 1000 mL

## 2019-05-03 MED ORDER — FENTANYL CITRATE (PF) 100 MCG/2ML IJ SOLN
INTRAMUSCULAR | Status: DC | PRN
  Administered 2019-05-03: 15 ug via INTRATHECAL

## 2019-05-03 MED ORDER — SCOPOLAMINE 1 MG/3DAYS TD PT72
1.0000 | MEDICATED_PATCH | Freq: Once | TRANSDERMAL | Status: DC
  Administered 2019-05-03: 08:00:00 1.5 mg via TRANSDERMAL

## 2019-05-03 MED ORDER — SODIUM CHLORIDE 0.9% FLUSH: 3.0000 mL | INTRAVENOUS | Status: DC | PRN

## 2019-05-03 MED ORDER — ONDANSETRON HCL 4 MG/2ML IJ SOLN
INTRAMUSCULAR | Status: DC | PRN
  Administered 2019-05-03: 4 mg via INTRAVENOUS

## 2019-05-03 MED ORDER — OXYTOCIN 10 UNIT/ML IJ SOLN
INTRAMUSCULAR | Status: DC | PRN
  Administered 2019-05-03: 40 [IU] via INTRAMUSCULAR

## 2019-05-03 MED ORDER — DIBUCAINE (PERIANAL) 1 % EX OINT: 1.0000 "application " | TOPICAL_OINTMENT | CUTANEOUS | Status: DC | PRN

## 2019-05-03 MED ORDER — OXYCODONE HCL 5 MG/5ML PO SOLN: 5.0000 mg | Freq: Once | ORAL | Status: DC | PRN

## 2019-05-03 MED ORDER — DEXAMETHASONE SODIUM PHOSPHATE 10 MG/ML IJ SOLN
INTRAMUSCULAR | Status: AC
  Filled 2019-05-03: qty 1

## 2019-05-03 MED ORDER — DIPHENHYDRAMINE HCL 25 MG PO CAPS: 25.0000 mg | ORAL_CAPSULE | Freq: Four times a day (QID) | ORAL | Status: DC | PRN

## 2019-05-03 MED ORDER — ZOLPIDEM TARTRATE 5 MG PO TABS: 5.0000 mg | ORAL_TABLET | Freq: Every evening | ORAL | Status: DC | PRN

## 2019-05-03 MED ORDER — FENTANYL CITRATE (PF) 100 MCG/2ML IJ SOLN
INTRAMUSCULAR | Status: AC
  Filled 2019-05-03: qty 2

## 2019-05-03 MED ORDER — DEXAMETHASONE SODIUM PHOSPHATE 10 MG/ML IJ SOLN
INTRAMUSCULAR | Status: DC | PRN
  Administered 2019-05-03: 10 mg via INTRAVENOUS

## 2019-05-03 MED ORDER — SODIUM CHLORIDE 0.9 % IR SOLN
Status: DC | PRN
  Administered 2019-05-03: 1000 mL

## 2019-05-03 MED ORDER — KETOROLAC TROMETHAMINE 30 MG/ML IJ SOLN
INTRAMUSCULAR | Status: AC
  Filled 2019-05-03: qty 1

## 2019-05-03 MED ORDER — ENOXAPARIN SODIUM 40 MG/0.4ML ~~LOC~~ SOLN
40.0000 mg | SUBCUTANEOUS | Status: DC
  Administered 2019-05-04 – 2019-05-05 (×2): 40 mg via SUBCUTANEOUS
  Filled 2019-05-03 (×2): qty 0.4

## 2019-05-03 MED ORDER — LACTATED RINGERS IV SOLN: INTRAVENOUS | Status: DC

## 2019-05-03 MED ORDER — KETOROLAC TROMETHAMINE 30 MG/ML IJ SOLN
30.0000 mg | Freq: Four times a day (QID) | INTRAMUSCULAR | Status: AC | PRN
  Administered 2019-05-03: 30 mg via INTRAMUSCULAR

## 2019-05-03 MED ORDER — SOD CITRATE-CITRIC ACID 500-334 MG/5ML PO SOLN
ORAL | Status: AC
  Filled 2019-05-03: qty 30

## 2019-05-03 SURGICAL SUPPLY — 29 items
BENZOIN TINCTURE PRP APPL 2/3 (GAUZE/BANDAGES/DRESSINGS) ×3 IMPLANT
CHLORAPREP W/TINT 26ML (MISCELLANEOUS) ×3 IMPLANT
CLAMP CORD UMBIL (MISCELLANEOUS) ×3 IMPLANT
CLOSURE WOUND 1/2 X4 (GAUZE/BANDAGES/DRESSINGS) ×1
CLOTH BEACON ORANGE TIMEOUT ST (SAFETY) ×3 IMPLANT
DRSG OPSITE POSTOP 4X10 (GAUZE/BANDAGES/DRESSINGS) ×3 IMPLANT
ELECT REM PT RETURN 9FT ADLT (ELECTROSURGICAL) ×3
ELECTRODE REM PT RTRN 9FT ADLT (ELECTROSURGICAL) ×1 IMPLANT
GLOVE BIO SURGEON STRL SZ 6.5 (GLOVE) ×2 IMPLANT
GLOVE BIO SURGEONS STRL SZ 6.5 (GLOVE) ×1
GLOVE BIOGEL PI IND STRL 7.0 (GLOVE) ×2 IMPLANT
GLOVE BIOGEL PI INDICATOR 7.0 (GLOVE) ×4
GOWN STRL REUS W/TWL LRG LVL3 (GOWN DISPOSABLE) ×6 IMPLANT
NEEDLE HYPO 25X5/8 SAFETYGLIDE (NEEDLE) ×3 IMPLANT
NS IRRIG 1000ML POUR BTL (IV SOLUTION) ×3 IMPLANT
PACK C SECTION WH (CUSTOM PROCEDURE TRAY) ×3 IMPLANT
PAD OB MATERNITY 4.3X12.25 (PERSONAL CARE ITEMS) ×3 IMPLANT
PENCIL SMOKE EVAC W/HOLSTER (ELECTROSURGICAL) ×3 IMPLANT
RETRACTOR WND ALEXIS 25 LRG (MISCELLANEOUS) ×1 IMPLANT
RTRCTR WOUND ALEXIS 25CM LRG (MISCELLANEOUS) ×3
STRIP CLOSURE SKIN 1/2X4 (GAUZE/BANDAGES/DRESSINGS) ×2 IMPLANT
SUT VIC AB 0 CT1 36 (SUTURE) ×18 IMPLANT
SUT VIC AB 2-0 CT1 27 (SUTURE) ×2
SUT VIC AB 2-0 CT1 TAPERPNT 27 (SUTURE) ×1 IMPLANT
SUT VIC AB 4-0 PS2 27 (SUTURE) ×3 IMPLANT
SYR CONTROL 10ML LL (SYRINGE) ×3 IMPLANT
TOWEL OR 17X24 6PK STRL BLUE (TOWEL DISPOSABLE) ×3 IMPLANT
TRAY FOLEY W/BAG SLVR 14FR LF (SET/KITS/TRAYS/PACK) ×3 IMPLANT
WATER STERILE IRR 1000ML POUR (IV SOLUTION) ×3 IMPLANT

## 2019-05-03 NOTE — Discharge Summary (Signed)
Postpartum Discharge Summary  Patient Name: Kathleen Nguyen DOB: 1985/08/04 MRN: [id number]  Date of admission: 05/03/2019 Delivering Provider: [place] Mode   Date of discharge: 05/05/2019  Admitting diagnosis: RCS Intrauterine pregnancy: [redacted]w[redacted]d    Secondary diagnosis:  Active Problems:   Language barrier   History of cesarean delivery   Anemia in pregnancy   [redacted] weeks gestation of pregnancy   Labor and delivery, indication for care  Additional problems: None     Discharge diagnosis: Term Pregnancy Delivered                                                                                                Post partum procedures:none  Augmentation: None  Complications: None  Hospital course:  Sceduled C/S   33y.o. yo [id number] admitted to the hospital 05/03/2019 for scheduled cesarean section with the following indication:Elective Repeat.  Membrane Rupture Time/Date: 10:04 AM ,05/03/2019   Patient delivered a Viable infant.05/03/2019   Details of operation can be found in separate operative note.  Pateint had an uncomplicated postpartum course.  She is ambulating, tolerating a regular diet, passing flatus, and urinating well. Patient is discharged home in stable condition on  05/05/19        Delivery time: 10:04 AM    Magnesium Sulfate received: No BMZ received: No Rhophylac:No MMR:No Transfusion:No  Physical exam  Vitals:   05/04/19 1659 05/04/19 1857 05/04/19 2217 05/05/19 0539  BP: (!) 86/53 (!) 101/58 104/65 101/63  Pulse: 67 70 77 72  Resp: 18  14 16   Temp: 97.9 F (36.6 C)  98.7 F (37.1 C) 97.7 F (36.5 C)  TempSrc: Oral  Oral Oral  SpO2: 100%     Weight:      Height:       General: alert Lochia: appropriate Uterine Fundus: firm Incision: Healing well with no significant drainage DVT Evaluation: No evidence of DVT seen on physical exam. Labs: Lab Results  Component Value Date   WBC 9.8 05/04/2019   HGB 10.8 (L) 05/04/2019   HCT 33.2  (L) 05/04/2019   MCV 87.6 05/04/2019   PLT 253 05/04/2019   CMP Latest Ref Rng & Units 05/03/2019  Glucose 65 - 99 mg/dL -  BUN 6 - 20 mg/dL -  Creatinine 0.44 - 1.00 mg/dL 0.38(L)  Sodium 134 - 144 mmol/L -  Potassium 3.5 - 5.2 mmol/L -  Chloride 96 - 106 mmol/L -  CO2 20 - 29 mmol/L -  Calcium 8.7 - 10.2 mg/dL -  Total Protein 6.0 - 8.5 g/dL -  Total Bilirubin 0.0 - 1.2 mg/dL -  Alkaline Phos 39 - 117 IU/L -  AST 0 - 40 IU/L -  ALT 0 - 32 IU/L -    Discharge instruction: per After Visit Summary and "Baby and Me Booklet".  After visit meds:  Allergies as of 05/05/2019   No Known Allergies     Medication List    STOP taking these medications   AMBULATORY NON FORMULARY MEDICATION   IRON PO     TAKE these medications  Certolizumab Pegol 2 X 200 MG/ML Kit Inject 200 mg into the skin every 14 (fourteen) days.   ibuprofen 800 MG tablet Commonly known as: ADVIL Take 1 tablet (800 mg total) by mouth every 8 (eight) hours.   oxyCODONE 5 MG immediate release tablet Commonly known as: Oxy IR/ROXICODONE Take 1-2 tablets (5-10 mg total) by mouth every 4 (four) hours as needed for up to 3 days for moderate pain.   Vitafol Gummies 3.33-0.333-34.8 MG Chew Chew 3 each by mouth daily.       Diet: No evidence of DVT seen on physical exam.  Activity: Advance as tolerated. Pelvic rest for 6 weeks.   Outpatient follow up:4 weeks Follow up Appt: Future Appointments  Date Time Provider [place]  05/17/2019  9:20 AM [place]  05/31/2019  2:20 PM Ofilia Neas, PA-C [facility] None  06/07/2019 10:55 AM Fair, Marin Shutter, MD [facility]   Follow up Visit:   Please schedule this patient for Postpartum visit in: 4 weeks with the following provider: Any provider For C/S patients schedule nurse incision check in weeks 2 weeks: yes Low risk pregnancy complicated by: 2 prior c-sections Delivery mode:  CS Anticipated Birth Control:  Paragard IUD at  post-partum visit PP Procedures needed: Incision check  Schedule Integrated [place] visit: no      Newborn Data: Live born female  Birth Weight: 3470 g APGAR: 68, 9  Newborn Delivery   Birth date/time: 05/03/2019 10:04:00 Delivery type: C-Section, Low Transverse Trial of labor: No C-section categorization: Repeat      Baby Feeding: Bottle and Breast Disposition:home with mother   05/05/2019 Alexis Frock, MD

## 2019-05-03 NOTE — Anesthesia Preprocedure Evaluation (Addendum)
Anesthesia Evaluation  Patient identified by MRN, date of birth, ID band Patient awake    Reviewed: Allergy & Precautions, NPO status , Patient's Chart, lab work & pertinent test results  Airway Mallampati: II  TM Distance: >3 FB Neck ROM: Full    Dental  (+) Teeth Intact, Dental Advisory Given,    Pulmonary neg pulmonary ROS,    Pulmonary exam normal breath sounds clear to auscultation       Cardiovascular negative cardio ROS Normal cardiovascular exam Rhythm:Regular Rate:Normal     Neuro/Psych negative neurological ROS  negative psych ROS   GI/Hepatic negative GI ROS, Neg liver ROS,   Endo/Other  negative endocrine ROS  Renal/GU negative Renal ROS   Uterine andenomyoma    Musculoskeletal  (+) Arthritis , Rheumatoid disorders,  Ankylosing spondylitis   Abdominal Normal abdominal exam  (+)   Peds negative pediatric ROS (+)  Hematology  (+) anemia , hct 37.3, plt 245   Anesthesia Other Findings Arabic speaking only  Reproductive/Obstetrics (+) Pregnancy 2 pior c sections                           Anesthesia Physical Anesthesia Plan  ASA: II  Anesthesia Plan: Spinal   Post-op Pain Management:  Regional for Post-op pain   Induction:   PONV Risk Score and Plan: 2 and Dexamethasone, Ondansetron and Treatment may vary due to age or medical condition  Airway Management Planned: Natural Airway  Additional Equipment: None  Intra-op Plan:   Post-operative Plan:   Informed Consent: I have reviewed the patients History and Physical, chart, labs and discussed the procedure including the risks, benefits and alternatives for the proposed anesthesia with the patient or authorized representative who has indicated his/her understanding and acceptance.       Plan Discussed with: CRNA  Anesthesia Plan Comments: (Arabic interpreter present)       Anesthesia Quick Evaluation

## 2019-05-03 NOTE — H&P (Signed)
Kathleen Nguyen is a [id number] 33 y.o. female presenting for elective repeat cesarean section at [redacted]w[redacted]d.  In-person interpreter present during entire visit.  . OB History    Gravida  4   Para  2   Term  2   Preterm      AB  1   Living  2     SAB  1   TAB      Ectopic      Multiple  0   Live Births  2          Past Medical History:  Diagnosis Date  . Anemia   . Ankylosing spondylitis ([place])   . Ankylosing spondylitis ([place])   . Rheumatoid arthritis [facility])    Past Surgical History:  Procedure Laterality Date  . CESAREAN SECTION    . CESAREAN SECTION     Family History: family history includes Arthritis/Rheumatoid in her mother. Social History:  reports that she has never smoked. She has never used smokeless tobacco. She reports that she does not drink alcohol or use drugs.     Maternal Diabetes: No Genetic Screening: Normal Maternal Ultrasounds/Referrals: Normal Fetal Ultrasounds or other Referrals:  None Maternal Substance Abuse:  No Significant Maternal Medications:  None Significant Maternal Lab Results:  None Other Comments:  None  ROS All other systems negative unless noted above in HPI.  History Reports feeling well today. No complaints. Excited to meet baby girl.    Blood pressure 117/74, temperature 98.5 F (36.9 C), temperature source Oral, height 5\' 5"  (1.651 m), weight 88 kg, last menstrual period 08/03/2018, SpO2 100 %. Exam  Physical Exam  Constitutional: She is oriented to person, place, and time. She appears well-developed and well-nourished. No distress.  HENT:  Head: Normocephalic and atraumatic.  Eyes: EOM are normal.  Neck: Normal range of motion.  Cardiovascular: Normal rate.  Respiratory: Effort normal.  GI: Soft. She exhibits no distension. There is no abdominal tenderness.  Musculoskeletal: Normal range of motion.  Neurological: She is alert and oriented to person, place, and time.  Skin: Skin is warm.  Low transverse abdominal  scar noted.  Psychiatric: She has a normal mood and affect.    Prenatal labs: ABO, Rh: --/--/O POS, O POS Performed at [facility], [place] [address] [place], [name] [address]  [phone] [id number]/07 0926) Antibody: NEG (11/07 0926) Rubella: 2.52 (06/12 1117) RPR: NON REACTIVE (11/07 0926)  HBsAg: Negative (06/12 1117)  HIV: Non Reactive (08/31 0848)  GBS: Negative/-- (10/27 1221)   Assessment/Plan: Repeat C-section (2 prior C-sections in [place]). Low transverse skin incision; no complications noted previously per patient. No other previous abdominal surgeries. The risks of cesarean section discussed with the patient included but were not limited to: bleeding which may require transfusion or reoperation; infection which may require antibiotics; injury to bowel, bladder, ureters or other surrounding organs; injury to the fetus; need for additional procedures including hysterectomy in the event of a life-threatening hemorrhage; placental abnormalities wth subsequent pregnancies, incisional problems, thromboembolic phenomenon and other postoperative/anesthesia complications. The patient concurred with the proposed plan, giving informed written consent for the procedure.  Patient has been NPO since midnight she will remain NPO for procedure. Anesthesia and OR aware. Preoperative prophylactic antibiotics and SCDs ordered on call to the OR.  To OR when ready.  Other Problems:  Anemia: Hgb 10.8 today MOC: Paragard IUD at post-partum visit MOF: Both  Kathleen Nguyen N Miachel Nguyen 05/03/2019, 9:00 AM

## 2019-05-03 NOTE — Anesthesia Postprocedure Evaluation (Signed)
Anesthesia Post Note  Patient: [name]  Procedure(s) Performed: CESAREAN SECTION (N/A Abdomen)     Patient location during evaluation: PACU Anesthesia Type: Spinal Level of consciousness: oriented and awake and alert Pain management: pain level controlled Vital Signs Assessment: post-procedure vital signs reviewed and stable Respiratory status: spontaneous breathing and respiratory function stable Cardiovascular status: blood pressure returned to baseline and stable Postop Assessment: no headache, no backache, no apparent nausea or vomiting, patient able to bend at knees and spinal receding Anesthetic complications: no    Last Vitals:  Vitals:   05/03/19 1342 05/03/19 1442  BP: 101/60 100/61  Pulse: 65 67  Resp: 18 16  Temp: 36.9 C 37.3 C  SpO2: 99% 100%    Last Pain:  Vitals:   05/03/19 1442  TempSrc: Axillary  PainSc: 2    Pain Goal: Patients Stated Pain Goal: 3 (05/03/19 1442)              Epidural/Spinal Function Cutaneous sensation: Able to Discern Pressure (05/03/19 1442), Patient able to flex knees: Yes (05/03/19 1442), Patient able to lift hips off bed: No (05/03/19 1442), Back pain beyond tenderness at insertion site: No (05/03/19 1442), Progressively worsening motor and/or sensory loss: No (05/03/19 1442), Bowel and/or bladder incontinence post epidural: No (05/03/19 1442)  Pervis Hocking

## 2019-05-03 NOTE — Op Note (Signed)
Davionna Knappenberger PROCEDURE DATE: 05/03/2019  PREOPERATIVE DIAGNOSES: Intrauterine pregnancy at [redacted]w[redacted]d weeks gestation; 2 prior c-sections; elective repeat  POSTOPERATIVE DIAGNOSES: The same  PROCEDURE: Repeat Low Transverse Cesarean Section  SURGEON:  Dr. Emeterio Reeve  ASSISTANT:  Barrington Ellison, MD An experienced assistant was required given the standard of surgical care given the complexity of the case.  This assistant was needed for exposure, dissection, suctioning, retraction, instrument exchange, assisting with delivery with administration of fundal pressure, and for overall help during the procedure.  ANESTHESIOLOGY TEAM: Anesthesiologist: Pervis Hocking, DO CRNA: Lyda Jester, CRNA  INDICATIONS: Kathleen Nguyen is a 33 y.o. [phone] at [redacted]w[redacted]d here for cesarean section secondary to the indications listed under preoperative diagnoses; please see preoperative note for further details.  The risks of cesarean section were discussed with the patient including but were not limited to: bleeding which may require transfusion or reoperation; infection which may require antibiotics; injury to bowel, bladder, ureters or other surrounding organs; injury to the fetus; need for additional procedures including hysterectomy in the event of a life-threatening hemorrhage; placental abnormalities wth subsequent pregnancies, incisional problems, thromboembolic phenomenon and other postoperative/anesthesia complications.   The patient concurred with the proposed plan, giving informed written consent for the procedure.    FINDINGS:  Viable female infant in cephalic presentation. Double nuchal cord. Clear amniotic fluid.  Intact placenta, three vessel cord.  Normal uterus, fallopian tubes and ovaries bilaterally. Bladder adhesions to lower uterine segment. APGAR (1 MIN): 9   APGAR (5 MINS): 9   APGAR (10 MINS):    ANESTHESIA: Spinal INTRAVENOUS FLUIDS: 1600 ml   ESTIMATED BLOOD LOSS: 274 ml URINE OUTPUT:   50 ml SPECIMENS: Placenta sent to L&D COMPLICATIONS: None immediate  PROCEDURE IN DETAIL:  The patient preoperatively received intravenous antibiotics and had sequential compression devices applied to her lower extremities.  She was then taken to the operating room where spinal anesthesia was administered and was found to be adequate. She was then placed in a dorsal supine position with a leftward tilt, and prepped and draped in a sterile manner.  A foley catheter was placed into her bladder and attached to constant gravity.  After an adequate timeout was performed, a Pfannenstiel skin incision was made with scalpel on her preexisting scarand carried through to the underlying layer of fascia. The fascia was incised in the midline, and this incision was extended bilaterally using the Mayo scissors.  Kocher clamps were applied to the superior aspect of the fascial incision and the underlying rectus muscles were dissected off bluntly and sharply.  A similar process was carried out on the inferior aspect of the fascial incision. The rectus muscles were separated in the midline and the peritoneum was entered bluntly. Due to scar tissue, rectus muscles taken down by 1-2 cm on each side with Mayo scissors. The Alexis self-retaining retractor was introduced into the abdominal cavity.  Attention was turned to the lower uterine segment. Bladder flab created and bladder blade inserted. A low transverse hysterotomy was made with a scalpel and extended bilaterally bluntly.  The infant was successfully delivered, two nuchal cords reduced. The cord was clamped and cut after one minute, and the infant was handed over to the awaiting neonatology team. Uterine massage was then administered, and the placenta delivered intact with a three-vessel cord. The uterus was then cleared of clots and debris.  The hysterotomy was closed with 0 Vicryl in a running locked fashion, and an imbricating layer was also placed with 0  Vicryl. The  pelvis was cleared of all clot and debris. Hemostasis was confirmed on all surfaces.  The retractor was removed.  The rectus muscles were loosely approximated with 2-0 Vicryl as peritoneum was not sufficient enough to close. The fascia was then closed using 0 Vicryl in a running fashion. The skin was closed with a 4-0 Vicryl subcuticular stitch. 30 mL 0.5% Marcaine injected around incision site. The patient tolerated the procedure well. Sponge, instrument and needle counts were correct x 3.  She was taken to the recovery room in stable condition.   Barrington Ellison, MD [facility] Family Medicine Fellow, [facility] [employer], [place]

## 2019-05-03 NOTE — Transfer of Care (Signed)
Immediate Anesthesia Transfer of Care Note  Patient: Kathleen Nguyen  Procedure(s) Performed: CESAREAN SECTION (N/A Abdomen)  Patient Location: PACU  Anesthesia Type:Spinal  Level of Consciousness: awake, alert  and oriented  Airway & Oxygen Therapy: Patient Spontanous Breathing  Post-op Assessment: Report given to RN and Post -op Vital signs reviewed and stable  Post vital signs: Reviewed and stable  Last Vitals:  Vitals Value Taken Time  BP 108/86 05/03/19 1050  Temp    Pulse 82 05/03/19 1053  Resp 19 05/03/19 1053  SpO2 98 % 05/03/19 1053  Vitals shown include unvalidated device data.  Last Pain:  Vitals:   05/03/19 0758  TempSrc: Oral         Complications: No apparent anesthesia complications

## 2019-05-03 NOTE — Anesthesia Procedure Notes (Signed)
Spinal  Patient location during procedure: OR Start time: 05/03/2019 9:41 AM Preanesthetic Checklist Completed: patient identified, site marked, surgical consent, pre-op evaluation, timeout performed, IV checked, risks and benefits discussed and monitors and equipment checked Spinal Block Patient position: sitting Prep: DuraPrep Patient monitoring: heart rate, cardiac monitor, continuous pulse ox and blood pressure Approach: midline Location: L3-4 Injection technique: single-shot Needle Needle type: Sprotte  Needle gauge: 24 G Needle length: 9 cm Assessment Sensory level: T4

## 2019-05-04 ENCOUNTER — Telehealth: Payer: Self-pay

## 2019-05-04 LAB — CBC
HCT: 33.2 % — ABNORMAL LOW (ref 36.0–46.0)
Hemoglobin: 10.8 g/dL — ABNORMAL LOW (ref 12.0–15.0)
MCH: 28.5 pg (ref 26.0–34.0)
MCHC: 32.5 g/dL (ref 30.0–36.0)
MCV: 87.6 fL (ref 80.0–100.0)
Platelets: 253 10*3/uL (ref 150–400)
RBC: 3.79 MIL/uL — ABNORMAL LOW (ref 3.87–5.11)
RDW: 15.9 % — ABNORMAL HIGH (ref 11.5–15.5)
WBC: 9.8 10*3/uL (ref 4.0–10.5)
nRBC: 0 % (ref 0.0–0.2)

## 2019-05-04 NOTE — Congregational Nurse Program (Signed)
Error

## 2019-05-04 NOTE — Telephone Encounter (Signed)
I have called Ms Soniya Ovens to check on her. She is still inpatient after c/section.She reports to be doing well. Pain is under control and she had a bowel movement post op.I have educated her on pain medication side effects,how to get a pediatrician and ambulation post op. I will follow up with her and the baby once they are discharged from the hospital. Honor Loh RN BSN [place] CNP [phone]

## 2019-05-04 NOTE — Progress Notes (Signed)
POSTPARTUM PROGRESS NOTE  POD #1  Subjective:  Kathleen Nguyen is a 33 y.o. [id number] s/p  Scheduled rLTCS at [redacted]w[redacted]d.  She reports she is doing well. She endorses difficulty urinating and has no had bowel movement. She is passing flatus. Pain is well controlled. Lochia is minimal .  Objective: Blood pressure (!) 91/55, pulse 60, temperature 97.6 F (36.4 C), temperature source Oral, resp. rate 18, height 5\' 5"  (1.651 m), weight 88 kg, last menstrual period 08/03/2018, SpO2 100 %, unknown if currently breastfeeding.  Physical Exam:  General: alert, cooperative and no distress Chest: no respiratory distress Heart:regular rate, distal pulses intact Abdomen: soft, nontender,  Uterine Fundus: firm, appropriately tender DVT Evaluation: No calf swelling or tenderness Extremities: no LE edema Skin: warm, dry; incision clean/dry/intact w/ honeycomb dressing in place  Recent Labs    05/03/19 1315 05/04/19 0555  HGB 11.8* 10.8*  HCT 37.0 33.2*    Assessment/Plan: Kathleen Nguyen is a 33 y.o. [phone] s/p scheduled rLTCS at [redacted]w[redacted]d  POD#1- Doing welll; pain controlled. H/H appropriate  Routine postpartum care  OOB, ambulated  Lovenox for VTE prophylaxis  Anemia: asymptomatic with hgb dropping from 11.8 to 10.8 today    Contraception: IUD outpatient  Feeding: breast feeding  Dispo: Plan for discharge POD 2 or 3.    LOS: 1 day   Kathleen Better, MD, PGY-1  OBGYN Faculty Teaching Service  05/04/2019, 7:43 AM

## 2019-05-04 NOTE — Lactation Note (Signed)
This note was copied from a baby's chart. Lactation Consultation Note  Patient Name: Kathleen Nguyen [id number] Date: 05/04/2019 Reason for consult: Initial assessment;Term Baby is 25 hours old/2% weight loss.  P3 mom states she breastfed her previous babies for 4 months.  She recently gave one supplement of formula because "no milk".  Instructed on hand expression and drops of colostrum expressed and finger fed to baby.  Discussed supply and demand.  Encouraged frequent feedings.  Mom is not sore but would like to apply something to nipples.  Coconut oil given with instructions.  Breastfeeding consultation services information given.  Encouraged to call prn.  Maternal Data Has patient been taught Hand Expression?: Yes Does the patient have breastfeeding experience prior to this delivery?: Yes  Feeding    LATCH Score Latch: Repeated attempts needed to sustain latch, nipple held in mouth throughout feeding, stimulation needed to elicit sucking reflex.  Audible Swallowing: None  Type of Nipple: Everted at rest and after stimulation  Comfort (Breast/Nipple): Soft / non-tender  Hold (Positioning): Assistance needed to correctly position infant at breast and maintain latch.  LATCH Score: 6  Interventions Interventions: Coconut oil  Lactation Tools Discussed/Used     Consult Status Consult Status: Follow-up Date: 05/05/19 Follow-up type: In-patient    Ave Filter 05/04/2019, 11:22 AM

## 2019-05-05 MED ORDER — IBUPROFEN 800 MG PO TABS: 800.0000 mg | ORAL_TABLET | Freq: Three times a day (TID) | ORAL | 0 refills | Status: DC

## 2019-05-05 MED ORDER — OXYCODONE HCL 5 MG PO TABS: 5.0000 mg | ORAL_TABLET | ORAL | 0 refills | Status: AC | PRN

## 2019-05-05 NOTE — Discharge Instructions (Signed)
Postpartum Care After Cesarean Delivery This sheet gives you information about how to care for yourself from the time you deliver your baby to up to 6-12 weeks after delivery (postpartum period). Your health care provider may also give you more specific instructions. If you have problems or questions, contact your health care provider. Follow these instructions at home: Medicines  Take over-the-counter and prescription medicines only as told by your health care provider.  If you were prescribed an antibiotic medicine, take it as told by your health care provider. Do not stop taking the antibiotic even if you start to feel better.  Ask your health care provider if the medicine prescribed to you: ? Requires you to avoid driving or using heavy machinery. ? Can cause constipation. You may need to take actions to prevent or treat constipation, such as:  Drink enough fluid to keep your urine pale yellow.  Take over-the-counter or prescription medicines.  Eat foods that are high in fiber, such as beans, whole grains, and fresh fruits and vegetables.  Limit foods that are high in fat and processed sugars, such as fried or sweet foods. Activity  Gradually return to your normal activities as told by your health care provider.  Avoid activities that take a lot of effort and energy (are strenuous) until approved by your health care provider. Walking at a slow to moderate pace is usually safe. Ask your health care provider what activities are safe for you. ? Do not lift anything that is heavier than your baby or 10 lb (4.5 kg) as told by your health care provider. ? Do not vacuum, climb stairs, or drive a car for as long as told by your health care provider.  If possible, have someone help you at home until you are able to do your usual activities yourself.  Rest as much as possible. Try to rest or take naps while your baby is sleeping. Vaginal bleeding  It is normal to have vaginal bleeding  (lochia) after delivery. Wear a sanitary pad to absorb vaginal bleeding and discharge. ? During the first week after delivery, the amount and appearance of lochia is often similar to a menstrual period. ? Over the next few weeks, it will gradually decrease to a dry, yellow-brown discharge. ? For most women, lochia stops completely by 4-6 weeks after delivery. Vaginal bleeding can vary from woman to woman.  Change your sanitary pads frequently. Watch for any changes in your flow, such as: ? A sudden increase in volume. ? A change in color. ? Large blood clots.  If you pass a blood clot, save it and call your health care provider to discuss. Do not flush blood clots down the toilet before you get instructions from your health care provider.  Do not use tampons or douches until your health care provider says this is safe.  If you are not breastfeeding, your period should return 6-8 weeks after delivery. If you are breastfeeding, your period may return anytime between 8 weeks after delivery and the time that you stop breastfeeding. Perineal care   If your C-section (Cesarean section) was unplanned, and you were allowed to labor and push before delivery, you may have pain, swelling, and discomfort of the tissue between your vaginal opening and your anus (perineum). You may also have an incision in the tissue (episiotomy) or the tissue may have torn during delivery. Follow these instructions as told by your health care provider: ? Keep your perineum clean and dry as told by  your health care provider. Use medicated pads and pain-relieving sprays and creams as directed. ? If you have an episiotomy or vaginal tear, check the area every day for signs of infection. Check for:  Redness, swelling, or pain.  Fluid or blood.  Warmth.  Pus or a bad smell. ? You may be given a squirt bottle to use instead of wiping to clean the perineum area after you go to the bathroom. As you start healing, you may use  the squirt bottle before wiping yourself. Make sure to wipe gently. ? To relieve pain caused by an episiotomy, vaginal tear, or hemorrhoids, try taking a warm sitz bath 2-3 times a day. A sitz bath is a warm water bath that is taken while you are sitting down. The water should only come up to your hips and should cover your buttocks. Breast care  Within the first few days after delivery, your breasts may feel heavy, full, and uncomfortable (breast engorgement). You may also have milk leaking from your breasts. Your health care provider can suggest ways to help relieve breast discomfort. Breast engorgement should go away within a few days.  If you are breastfeeding: ? Wear a bra that supports your breasts and fits you well. ? Keep your nipples clean and dry. Apply creams and ointments as told by your health care provider. ? You may need to use breast pads to absorb milk leakage. ? You may have uterine contractions every time you breastfeed for several weeks after delivery. Uterine contractions help your uterus return to its normal size. ? If you have any problems with breastfeeding, work with your health care provider or a Science writer.  If you are not breastfeeding: ? Avoid touching your breasts as this can make your breasts produce more milk. ? Wear a well-fitting bra and use cold packs to help with swelling. ? Do not squeeze out (express) milk. This causes you to make more milk. Intimacy and sexuality  Ask your health care provider when you can engage in sexual activity. This may depend on your: ? Risk of infection. ? Healing rate. ? Comfort and desire to engage in sexual activity.  You are able to get pregnant after delivery, even if you have not had your period. If desired, talk with your health care provider about methods of family planning or birth control (contraception). Lifestyle  Do not use any products that contain nicotine or tobacco, such as cigarettes, e-cigarettes,  and chewing tobacco. If you need help quitting, ask your health care provider.  Do not drink alcohol, especially if you are breastfeeding. Eating and drinking   Drink enough fluid to keep your urine pale yellow.  Eat high-fiber foods every day. These may help prevent or relieve constipation. High-fiber foods include: ? Whole grain cereals and breads. ? Brown rice. ? Beans. ? Fresh fruits and vegetables.  Take your prenatal vitamins until your postpartum checkup or until your health care provider tells you it is okay to stop. General instructions  Keep all follow-up visits for you and your baby as told by your health care provider. Most women visit their health care provider for a postpartum checkup within the first 3-6 weeks after delivery. Contact a health care provider if you:  Feel unable to cope with the changes that a new baby brings to your life, and these feelings do not go away.  Feel unusually sad or worried.  Have breasts that are painful, hard, or turn red.  Have a fever.  Have trouble holding urine or keeping urine from leaking. °· Have little or no interest in activities you used to enjoy. °· Have not breastfed at all and you have not had a menstrual period for 12 weeks after delivery. °· Have stopped breastfeeding and you have not had a menstrual period for 12 weeks after you stopped breastfeeding. °· Have questions about caring for yourself or your baby. °· Pass a blood clot from your vagina. °Get help right away if you: °· Have chest pain. °· Have difficulty breathing. °· Have sudden, severe leg pain. °· Have severe pain or cramping in your abdomen. °· Bleed from your vagina so much that you fill more than one sanitary pad in one hour. Bleeding should not be heavier than your heaviest period. °· Develop a severe headache. °· Faint. °· Have blurred vision or spots in your vision. °· Have a bad-smelling vaginal discharge. °· Have thoughts about hurting yourself or your  baby. °If you ever feel like you may hurt yourself or others, or have thoughts about taking your own life, get help right away. You can go to your nearest emergency department or call: °· Your local emergency services (911 in the [place] °· A suicide crisis helpline, such as the National Suicide Prevention Lifeline at 1-800-273-8255. This is open 24 hours a day. °Summary °· The period of time from when you deliver your baby to up to 6-12 weeks after delivery is called the postpartum period. °· Gradually return to your normal activities as told by your health care provider. °· Keep all follow-up visits for you and your baby as told by your health care provider. °This information is not intended to replace advice given to you by your health care provider. Make sure you discuss any questions you have with your health care provider. °Document Released: 06/07/2000 Document Revised: 01/28/2018 Document Reviewed: 01/28/2018 °Elsevier Patient Education © 2020 Elsevier Inc. ° °

## 2019-05-11 ENCOUNTER — Telehealth: Payer: Self-pay

## 2019-05-11 NOTE — Telephone Encounter (Signed)
Ms Rianna called with questions regarding registration of birth certificate. All questions answered. Honor Loh RN BSN PCCN

## 2019-05-17 ENCOUNTER — Ambulatory Visit (INDEPENDENT_AMBULATORY_CARE_PROVIDER_SITE_OTHER): Payer: BC Managed Care – PPO

## 2019-05-17 ENCOUNTER — Other Ambulatory Visit: Payer: Self-pay

## 2019-05-17 VITALS — BP 110/76 | HR 79 | Wt 187.6 lb

## 2019-05-17 DIAGNOSIS — Z5189 Encounter for other specified aftercare: Secondary | ICD-10-CM

## 2019-05-17 NOTE — Progress Notes (Signed)
Pt here today for incision check s/p rpt c-section on 05/03/19.  With Video Interpreter # [phone] pt denies any pain but does have some bleeding at the site.  Incision well approximated no odor, no drainage, and no erthyema.  Pt asked if she could use hair removal.  I advised pt to wait at least her six weeks before hair removal.  Pt asked if she could use an abdominal binder.  I informed pt that it is safe to use a binder at this time.  Pt verbalized understanding.    Mel Almond, RN 05/17/19

## 2019-05-18 ENCOUNTER — Telehealth: Payer: Self-pay

## 2019-05-18 NOTE — Telephone Encounter (Signed)
Ms Zhou called requesting transportation to dentist office. Same done. Earlie Server Muhoro RN BSN [facility] CNP. [phone]

## 2019-05-26 ENCOUNTER — Telehealth: Payer: Self-pay

## 2019-05-26 NOTE — Telephone Encounter (Signed)
Ms Kathleen Nguyen called me requesting transportation assistance to dentist office tomorrow December 2nd. I have called [facility] and a ride is scheduled tomorrow @0919am . I have communicated this information to [name]. Earlie Server Muhoro RN BSN PCCN CNP [phone]

## 2019-05-31 ENCOUNTER — Telehealth (INDEPENDENT_AMBULATORY_CARE_PROVIDER_SITE_OTHER): Payer: BC Managed Care – PPO | Admitting: Rheumatology

## 2019-05-31 ENCOUNTER — Ambulatory Visit: Payer: BC Managed Care – PPO | Admitting: Rheumatology

## 2019-05-31 ENCOUNTER — Encounter: Payer: Self-pay | Admitting: Rheumatology

## 2019-05-31 ENCOUNTER — Other Ambulatory Visit: Payer: Self-pay

## 2019-05-31 DIAGNOSIS — L659 Nonscarring hair loss, unspecified: Secondary | ICD-10-CM

## 2019-05-31 DIAGNOSIS — M461 Sacroiliitis, not elsewhere classified: Secondary | ICD-10-CM | POA: Diagnosis not present

## 2019-05-31 DIAGNOSIS — H04123 Dry eye syndrome of bilateral lacrimal glands: Secondary | ICD-10-CM | POA: Diagnosis not present

## 2019-05-31 DIAGNOSIS — Z789 Other specified health status: Secondary | ICD-10-CM

## 2019-05-31 DIAGNOSIS — Z758 Other problems related to medical facilities and other health care: Secondary | ICD-10-CM

## 2019-05-31 DIAGNOSIS — M457 Ankylosing spondylitis of lumbosacral region: Secondary | ICD-10-CM | POA: Diagnosis not present

## 2019-05-31 DIAGNOSIS — Z79899 Other long term (current) drug therapy: Secondary | ICD-10-CM

## 2019-05-31 DIAGNOSIS — D269 Other benign neoplasm of uterus, unspecified: Secondary | ICD-10-CM

## 2019-05-31 DIAGNOSIS — B353 Tinea pedis: Secondary | ICD-10-CM

## 2019-05-31 NOTE — Progress Notes (Signed)
Virtual Visit via Telephone Note  I connected with Kathleen Nguyen on 05/31/19 at 10:00 AM EST by telephone and verified that I am speaking with the correct person using two identifiers.  Location: Patient: Home Provider: Clinic  This service was conducted via virtual visit.  The patient was located at home. I was located in my office.  Consent was obtained prior to the virtual visit and is aware of possible charges through their insurance for this visit.  The patient is an established patient.  Dr. Estanislado Pandy, MD conducted the virtual visit and Hazel Sams, PA-C acted as scribe during the service.  Office staff helped with scheduling follow up visits after the service was conducted.   I discussed the limitations, risks, security and privacy concerns of performing an evaluation and management service by telephone and the availability of in person appointments. I also discussed with the patient that there may be a patient responsible charge related to this service. The patient expressed understanding and agreed to proceed.  CC: Lower back pain History of Present Illness: Patient is a 33 year old female with a history of ankylosing spondylitis. She had a C-section on 05/03/19.  She has been breastfeeding.  She states her baby is doing well.  She has been holding Cimzia since September.  She has been having increased lower back pain recently.  She denies any morning stiffness. She would like to restart on Cimzia.   Review of Systems  Constitutional: Negative for fever and malaise/fatigue.  HENT:       +Dry mouth  Eyes: Negative for photophobia, pain, discharge and redness.       +Dry eyes  Respiratory: Negative for cough, shortness of breath and wheezing.   Cardiovascular: Negative for chest pain and palpitations.  Gastrointestinal: Negative for blood in stool, constipation and diarrhea.  Genitourinary: Negative for dysuria.  Musculoskeletal: Positive for back pain. Negative for joint pain,  myalgias and neck pain.  Skin: Negative for rash.  Neurological: Negative for dizziness and headaches.  Psychiatric/Behavioral: Negative for depression. The patient is not nervous/anxious and does not have insomnia.       Observations/Objective: Physical Exam  Constitutional: She is oriented to person, place, and time.  Neurological: She is alert and oriented to person, place, and time.  Psychiatric: Mood, memory, affect and judgment normal.   Patient reports morning stiffness for 0 minutes.   Patient denies nocturnal pain.  Difficulty dressing/grooming: Denies Difficulty climbing stairs: Denies Difficulty getting out of chair: Denies Difficulty using hands for taps, buttons, cutlery, and/or writing: Denies   Assessment and Plan: Visit Diagnoses: Ankylosing spondylitis of lumbosacral region [facility]): She is having increased lower back and SI joint pain bilaterally.  Her pain is currently a 1/10.  She is not having any morning stiffness or nocturnal pain at this time.  She has been holding Cimzia  throughout her pregnancy.  Her last dose was in September 2020.  She had a C-section on 05/03/19 and has been breastfeeding since then.  She was cleared to restart on Cimzia 200 mg sq every 14 days.   She will follow up in the office in 3-4 months.   High risk medication use -Cimzia 200 mg sq injections every 14 days.  CBC and creatinine were ordered on 11/9/-05/04/19.    Sacroiliitis [facility]):  She is having increased bilateral SI joint pain. No morning stiffness.  No symptoms of sciatica.    Dry eyes:  She has chronic eye dryness but no inflammation.   Other  medical conditions are listed as follows:   Hair loss  Language barrier  Miscarriage  Tinea pedis of left foot  Uterine adenomyoma   Follow Up Instructions: She will follow up in 3-4 months.    I discussed the assessment and treatment plan with the patient. The patient was provided an opportunity to ask questions and  all were answered. The patient agreed with the plan and demonstrated an understanding of the instructions.   The patient was advised to call back or seek an in-person evaluation if the symptoms worsen or if the condition fails to improve as anticipated.  I provided 15 minutes of non-face-to-face time during this encounter.   Bo Merino, MD   Scribed by-  Hazel Sams, PA-C

## 2019-06-01 ENCOUNTER — Telehealth: Payer: Self-pay | Admitting: *Deleted

## 2019-06-01 ENCOUNTER — Other Ambulatory Visit: Payer: Self-pay | Admitting: *Deleted

## 2019-06-01 DIAGNOSIS — M457 Ankylosing spondylitis of lumbosacral region: Secondary | ICD-10-CM

## 2019-06-01 MED ORDER — CERTOLIZUMAB PEGOL 2 X 200 MG/ML ~~LOC~~ KIT: 200.0000 mg | PACK | SUBCUTANEOUS | 2 refills | Status: DC

## 2019-06-01 NOTE — Telephone Encounter (Signed)
Patient would like a call regarding her Cimzia. Thank you.

## 2019-06-01 NOTE — Telephone Encounter (Signed)
Returned patient's call, she needs assistance to schedule shipment. Advised her that I can [facility] call [facility] tomorrow. Patient says she will be free to call tomorrow at [place].

## 2019-06-02 NOTE — Telephone Encounter (Signed)
[facility]ed [facility] patient and scheduled 06/08/19 Cimzia shipment. Advised patient to contact office if she has any issues or does not receive shipment on 12/15.  10:07 AM Beatriz Chancellor, CPhT

## 2019-06-07 ENCOUNTER — Ambulatory Visit: Payer: BC Managed Care – PPO | Admitting: Family Medicine

## 2019-06-17 NOTE — Progress Notes (Signed)
Chart reviewed for nurse visit. Agree with plan of care.   [place], [place] 06/17/2019 6:07 PM

## 2019-06-28 ENCOUNTER — Telehealth: Payer: Self-pay

## 2019-06-28 NOTE — Telephone Encounter (Signed)
Ms Pier called with transportation request to and from the Dentist office. Same done. Honor Loh RN BSN PCCN [phone]

## 2019-06-29 ENCOUNTER — Telehealth: Payer: Self-pay | Admitting: Rheumatology

## 2019-06-29 NOTE — Telephone Encounter (Signed)
Returned patient's call, [facility] says [facility] are having trouble with the patient's Cimzia Savings Card. Conference called [employer] Department with patient. Rep Otila Kluver, advised they will need to reverify benefits for patient for the new calendar year, before they can send patient a new Savings Card. They will call patient back once benefits have been reviewed. And I advised her to call and let me know what they say.  Phone- [phone], option 1  Advised patient that she can stop by office to pick up a sample, while we work on fixing her [employer].  10:50 AM Beatriz Chancellor, CPhT

## 2019-06-29 NOTE — Telephone Encounter (Signed)
Patient left a voicemail requesting to speak with Kathleen Nguyen directly regarding her Cimzia.  Patient requested a return call.

## 2019-06-30 NOTE — Congregational Nurse Program (Signed)
  Dept: [place] Nurse Program Note  Date of Encounter: 06/30/2019  Past Medical History: Past Medical History:  Diagnosis Date  . Anemia   . Ankylosing spondylitis ([place])   . Ankylosing spondylitis ([place])   . Rheumatoid arthritis [facility])     Encounter Details: CNP Questionnaire - 06/30/19 2214      Questionnaire   Patient Status  Immigrant    Race  African    Location Patient Served At  [facility]    Uninsured  Not Applicable    Food  No food insecurities    Housing/Utilities  Yes, have permanent housing    Transportation  No transportation needs    Interpersonal Safety  Yes, feel physically and emotionally safe where you currently live    Medication  No medication insecurities    Medical Provider  Yes    Referrals  Area Agency    ED Visit Averted  Not Applicable    Life-Saving Intervention Made  Not Applicable     Client came to ask for help with hospital and other medical bills. She does not have all the bills due and needs to get these. 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  CNP contacted Register with [facility] and spoke with Prathik Aman Panda [phone] who is inquiring into her outstanding bills. CNP willcall [place] billing and followup with client next week. Client also explains she is up with her newborn at night and is tired. Her pulse was  97. Marguerite Olea, RN, BSN, CNP [phone]

## 2019-07-02 NOTE — Telephone Encounter (Signed)
Called [employer]im[employer] Savings card department with patient, patient's card has been reloaded for January 2021. Called [facility] to schedule patient's next shipment was scheduled for 07/07/19.  Phone- [phone], option 1

## 2019-07-05 NOTE — Telephone Encounter (Signed)
Medication Samples have been provided to the patient.  Drug name: Cimzia  Strength: 200mg    Qty: 1 LOT: [id number]  Exp.Date: 03/2020  Dosing instructions: Inject 200mg  into the skin every 14 days.   The patient has been instructed regarding the correct time, dose, and frequency of taking this medication, including desired effects and most common side effects.   Kathleen Nguyen 9:07 AM 07/05/2019

## 2019-07-06 ENCOUNTER — Other Ambulatory Visit: Payer: Self-pay

## 2019-07-06 ENCOUNTER — Telehealth: Payer: Self-pay

## 2019-07-06 ENCOUNTER — Encounter: Payer: Self-pay | Admitting: Nurse Practitioner

## 2019-07-06 ENCOUNTER — Ambulatory Visit (INDEPENDENT_AMBULATORY_CARE_PROVIDER_SITE_OTHER): Payer: BC Managed Care – PPO | Admitting: Nurse Practitioner

## 2019-07-06 DIAGNOSIS — Z30011 Encounter for initial prescription of contraceptive pills: Secondary | ICD-10-CM

## 2019-07-06 DIAGNOSIS — Z1389 Encounter for screening for other disorder: Secondary | ICD-10-CM | POA: Diagnosis not present

## 2019-07-06 DIAGNOSIS — K5909 Other constipation: Secondary | ICD-10-CM

## 2019-07-06 MED ORDER — DOCUSATE CALCIUM 240 MG PO CAPS: 240.0000 mg | ORAL_CAPSULE | Freq: Every day | ORAL | 2 refills | Status: DC

## 2019-07-06 MED ORDER — NORETHINDRONE 0.35 MG PO TABS: 1.0000 | ORAL_TABLET | Freq: Every day | ORAL | 6 refills | Status: DC

## 2019-07-06 NOTE — Telephone Encounter (Signed)
I have called [employer] and made arrangements for Kathleen Nguyen to and from [facility] office. Honor Loh RN BSN PCCN [phone]

## 2019-07-06 NOTE — Patient Instructions (Signed)

## 2019-07-06 NOTE — Progress Notes (Signed)
Subjective:   Language Barrier - interpreter present for the entire visit.  Kathleen Nguyen is a 34 y.o. female who presents for a postpartum visit. She is a few weeks postpartum following a Repeat ceseran. I have fully reviewed the prenatal and intrapartum course. The delivery was at 81 gestational weeks. Outcome: repeat cesarean section, low vertical incision. Anesthesia: spinal. Postpartum course has been unremarkable. Baby's course has been unremarkable. Baby is feeding by both breast and bottle - gerber good start. Bleeding no bleeding. Bowel function is normal. Bladder function is normal. Patient is not sexually active. Contraception method is oral progesterone-only contraceptive. Postpartum depression screening: Negative. Having some constipation.  The following portions of the patient's history were reviewed and updated as appropriate: allergies, current medications, past family history, past medical history, past social history, past surgical history and problem list.  Review of Systems Pertinent items noted in HPI and remainder of comprehensive ROS otherwise negative.   Objective:    LMP 08/03/2018   General:  alert, cooperative and no distress   Breasts:  not examined  Lungs: clear to auscultation bilaterally  Heart:  regular rate and rhythm, S1, S2 normal, no murmur, click, rub or gallop  Abdomen: soft, non-tender; bowel sounds normal; no masses,  no organomegaly  C/S incision is well healed and nontender   Vulva:   deferred  Vagina:   Cervix:    Corpus:   Adnexa:    Rectal Exam:         Assessment:    Normal  postpartum exam. Pap smear not done at today's visit.   Plan:    1. Contraception: oral progesterone-only contraceptive  To start now and delay intercourse for 2 weeks. 2. Follow up with PCP for other medical needs.  Reviewed measures to resolve constipation. 3. Follow up in: 6 months or as needed.   Will need to check on breastfeeding and decide if combined pills  will be needed now.  Earlie Server, RN, MSN, NP-BC Nurse Practitioner, [facility] for [employer], [place] [facility] 07/06/2019 8:03 PM

## 2019-07-07 ENCOUNTER — Telehealth: Payer: Self-pay

## 2019-07-07 NOTE — Congregational Nurse Program (Signed)
  Dept: [place] Nurse Program Note  Date of Encounter: 07/07/2019  Past Medical History: Past Medical History:  Diagnosis Date  . Anemia   . Ankylosing spondylitis ([place])   . Ankylosing spondylitis ([place])   . Rheumatoid arthritis ([place])     Encounter Details:  Client came in to have help getting medical bill information. We called [facility] and found that client's bills went to a Glass blower/designer. Client asked to call Agency together. We called and got her billing information together-each bill itemized so she can inform her husband. She will contact Medicaid to try and get a retro letter for a June service date for facility charge. The collection agency will accept and review a retro letter from [facility]. We spoke with Kathleen Nguyen at [employer]. Kathleen Nguyen will follow up with Medicaid.  Kathleen Olea, Kathleen Nguyen, BSN, Kathleen Nguyen [phone]

## 2019-07-13 ENCOUNTER — Telehealth: Payer: Self-pay

## 2019-07-13 NOTE — Telephone Encounter (Signed)
Pt left VM on nurse line requesting a call back.   Called pt with [place] interpreter Ahmed ID [id number]. Pt requests results of UPT from [place] visit on 07/06/19. Per chart review pt did not have a UPT done at that visit. Pt recalls she was supposed to wait to begin pills until having a negative UPT. Pt states she last had intercourse 1 week ago. Explained to pt she will need to not have intercourse for another week and then take a home pregnancy test. If the test is negative she may begin taking OCPs. Also explained to pt she will need to use another form of contraception for the first 2 weeks of OCPs. Pt verbalizes understanding and has no other concerns.

## 2019-07-13 NOTE — Telephone Encounter (Signed)
Made a phone call to assist client with billing questions. Called [place] financial. They would not speak to me but would be glad to offer an interpreter for this client when she calls.

## 2019-07-21 ENCOUNTER — Telehealth: Payer: Self-pay

## 2019-07-21 NOTE — Telephone Encounter (Signed)
Ms Guzzetta called me requesting a ride to and from Doctor`s office.Same done. Honor Loh RN BSN PCCN [phone]

## 2019-07-23 ENCOUNTER — Telehealth: Payer: Self-pay | Admitting: Rheumatology

## 2019-07-23 NOTE — Telephone Encounter (Signed)
Patient states she is having trouble [facility] special[facility]. Patient states she has received a bill even after the co-pay card. Patient states the current bill for over $100. Can you please look into this? Thanks!

## 2019-07-23 NOTE — Telephone Encounter (Signed)
Patient Kathleen Nguyen requesting a call back from Seth Bake or [place], stating it is urgent. No other information given.

## 2019-07-26 NOTE — Telephone Encounter (Signed)
Called patient, no answer left message.  Called Cimzia, Rep Josh advised that [facility] attempted to run an amount higher than what was on card, he added funds to patient's card. He also updated patient's savings card was updated to auto-replenish monthly. We will need to call them back after patient's next refill to update funds to original amount.  Cimzia savings- [phone]  Called [facility], they put in request to reprocess Copay debit card. Will call back tomorrow to confirm it went through.  [facility] billing# [id number]  10:31 AM Beatriz Chancellor, CPhT

## 2019-07-27 NOTE — Telephone Encounter (Signed)
Called [facility], rep states that full balance was submitted to Cimzia copay card. Will follow up with patient's next refill.  [facility]# [id number]  12:02 PM Beatriz Chancellor, CPhT

## 2019-08-16 ENCOUNTER — Telehealth: Payer: Self-pay

## 2019-08-16 NOTE — Telephone Encounter (Signed)
Transport arrangements to and from the dentist office done. Patient made aware and verbalized understanding Honor Loh rn BSN [id number]

## 2019-08-18 ENCOUNTER — Telehealth: Payer: Self-pay

## 2019-08-18 DIAGNOSIS — Z139 Encounter for screening, unspecified: Secondary | ICD-10-CM

## 2019-08-18 LAB — GLUCOSE, POCT (MANUAL RESULT ENTRY): POC Glucose: 139 mg/dl — AB (ref 70–99)

## 2019-08-18 NOTE — Telephone Encounter (Signed)
Spoke to [place] at [facility] to get client appointment. Client requesting hemoglobin to be checked. Kathleen Nguyen explained she needs to establish care because she has not been seen there in a long time. Set appointment for August 26, 2019 at 1:50pm with [name]. Marguerite Olea, RN, BSN, CNP [phone]

## 2019-08-18 NOTE — Congregational Nurse Program (Signed)
  Dept: [place] [facility] Nurse Program Note  Date of Encounter: 08/18/2019  Past Medical History: Past Medical History:  Diagnosis Date  . Anemia   . Ankylosing spondylitis ([place])   . Ankylosing spondylitis ([place])   . Rheumatoid arthritis ([place])     Encounter Details:   Client explains she did not have an appetite. She said she was anemic when she was pregnant and was wondering fir I could check her hemoglobin. I told her I did not have that lab capability her. Client came to clinic to get blood Pressure 106/67 and blood sugar (not Fasting) 137. She had walked over to the clinic and her pulse was 106. She sat for 5 minutes and her pulse went to 98 and 86. I took her temperature which was 99.5. She said she is not sleeping well at night. She has a new born. She requested that I try and get her into [facility]. I called [facility] and spoke with [place]. Appointment set for August 26, 2019 at 1:50pm to see [name], NP.Marland Kitchen Marguerite Olea, RN, BSN, CNP [phone]

## 2019-08-25 ENCOUNTER — Telehealth: Payer: Self-pay

## 2019-08-25 NOTE — Congregational Nurse Program (Signed)
  Dept: [place] Nurse Program Note  Date of Encounter: 08/25/2019  Past Medical History: Past Medical History:  Diagnosis Date  . Anemia   . Ankylosing spondylitis ([place])   . Ankylosing spondylitis ([place])   . Rheumatoid arthritis [facility])     Encounter Details: CNP Questionnaire - 08/25/19 1615      Questionnaire   Patient Status  Immigrant    Race  African    Location Patient Served At  [facility]    Uninsured  Not Applicable    Food  No food insecurities    Housing/Utilities  Yes, have permanent housing    Transportation  Yes, need transportation assistance    Interpersonal Safety  Yes, feel physically and emotionally safe where you currently live    Medication  No medication insecurities    Medical Provider  Yes    Referrals  Primary Care Provider/Clinic    ED Visit Averted  Not Applicable    Life-Saving Intervention Made  Not Applicable      Client came to get blood pressure checked and written referral to take to [facility] appointment tomorrow at 1:50pm. Ciennt states she does not have transportation.  I made a call to Transportation to arrange ride for the client to this appointment. She will be ready for transportation at her home tomorrow at 1:00pm. She explained she is still not sleeping well. Her baby is sleeping suring the day and up more at night. Her blood pressure today 106/70 and pulse 86. Marguerite Olea, RN, BSN, CNP [phone]

## 2019-08-25 NOTE — Telephone Encounter (Signed)
Client requesting transportation to appointment to [facility] tomoorow. Appointment at 1:50. Ebony Hail got transportation and it will arrive at client's home tomorrow at 1pm. Client informed. Thank you. Marguerite Olea, RN, BSN, CNP [phone]

## 2019-08-26 ENCOUNTER — Ambulatory Visit (INDEPENDENT_AMBULATORY_CARE_PROVIDER_SITE_OTHER): Payer: BC Managed Care – PPO | Admitting: Primary Care

## 2019-08-26 ENCOUNTER — Encounter (INDEPENDENT_AMBULATORY_CARE_PROVIDER_SITE_OTHER): Payer: Self-pay | Admitting: Primary Care

## 2019-08-26 ENCOUNTER — Other Ambulatory Visit: Payer: Self-pay

## 2019-08-26 VITALS — BP 110/68 | HR 73 | Temp 97.3°F | Ht 65.0 in | Wt 176.6 lb

## 2019-08-26 DIAGNOSIS — R300 Dysuria: Secondary | ICD-10-CM

## 2019-08-26 DIAGNOSIS — D508 Other iron deficiency anemias: Secondary | ICD-10-CM | POA: Diagnosis not present

## 2019-08-26 DIAGNOSIS — K5900 Constipation, unspecified: Secondary | ICD-10-CM

## 2019-08-26 DIAGNOSIS — R5383 Other fatigue: Secondary | ICD-10-CM | POA: Diagnosis not present

## 2019-08-26 LAB — POCT URINALYSIS DIP (CLINITEK)
Bilirubin, UA: NEGATIVE
Blood, UA: NEGATIVE
Glucose, UA: NEGATIVE mg/dL
Ketones, POC UA: NEGATIVE mg/dL
Leukocytes, UA: NEGATIVE
Nitrite, UA: NEGATIVE
POC PROTEIN,UA: NEGATIVE
Spec Grav, UA: 1.025 (ref 1.010–1.025)
Urobilinogen, UA: 0.2 E.U./dL
pH, UA: 5.5 (ref 5.0–8.0)

## 2019-08-26 NOTE — Progress Notes (Signed)
Established Patient Office Visit  Subjective:  Patient ID: Kathleen Nguyen, female    DOB: May 12, 1986  Age: 34 y.o. MRN: [id number]  CC:  Chief Complaint  Patient presents with  . burning with urination    HPI Kathleen Nguyen presents to establish care with new provider but is establish with [facility]. She has concerns with burning with urination, tired and fatigue. She has a PMH of anemia.   Past Medical History:  Diagnosis Date  . Anemia   . Ankylosing spondylitis ([place])   . Ankylosing spondylitis ([place])   . Rheumatoid arthritis [facility])     Past Surgical History:  Procedure Laterality Date  . CESAREAN SECTION    . CESAREAN SECTION    . CESAREAN SECTION N/A 05/03/2019   Procedure: CESAREAN SECTION;  Surgeon: Woodroe Mode, MD;  Location: [facility] LD ORS;  Service: Obstetrics;  Laterality: N/A;    Family History  Problem Relation Age of Onset  . Arthritis/Rheumatoid Mother     Social History   Socioeconomic History  . Marital status: Married    Spouse name: Not on file  . Number of children: 3  . Years of education: College  . Highest education level: Bachelor's degree (e.g., BA, AB, BS)  Occupational History  . Occupation: Unemployed  Tobacco Use  . Smoking status: Never Smoker  . Smokeless tobacco: Never Used  Substance and Sexual Activity  . Alcohol use: No  . Drug use: No  . Sexual activity: Yes    Birth control/protection: None  Other Topics Concern  . Not on file  Social History Narrative  . Not on file   Social Determinants of Health   Financial Resource Strain:   . Difficulty of Paying Living Expenses: Not on file  Food Insecurity: No Food Insecurity  . Worried About Charity fundraiser in the Last Year: Never true  . Ran Out of Food in the Last Year: Never true  Transportation Needs: No Transportation Needs  . Lack of Transportation (Medical): No  . Lack of Transportation (Non-Medical): No  Physical Activity:   . Days of Exercise per Week: Not on file  .  Minutes of Exercise per Session: Not on file  Stress:   . Feeling of Stress : Not on file  Social Connections: Unknown  . Frequency of Communication with Friends and Family: Not on file  . Frequency of Social Gatherings with Friends and Family: Not on file  . Attends Religious Services: Not on file  . Active Member of Clubs or Organizations: Not on file  . Attends Archivist Meetings: Not on file  . Marital Status: Married  Human resources officer Violence: Not At Risk  . Fear of Current or Ex-Partner: No  . Emotionally Abused: No  . Physically Abused: No  . Sexually Abused: No    Outpatient Medications Prior to Visit  Medication Sig Dispense Refill  . Certolizumab Pegol 2 X 200 MG/ML KIT Inject 200 mg into the skin every 14 (fourteen) days. (Patient not taking: Reported on 07/06/2019) 1 kit 2  . docusate calcium (SURFAK) 240 MG capsule Take 1 capsule (240 mg total) by mouth daily. 30 capsule 2  . ibuprofen (ADVIL) 800 MG tablet Take 1 tablet (800 mg total) by mouth every 8 (eight) hours. (Patient not taking: Reported on 07/06/2019) 30 tablet 0  . norethindrone (MICRONOR) 0.35 MG tablet Take 1 tablet (0.35 mg total) by mouth daily. 1 Package 6  . Prenatal Vit-Fe Phos-FA-Omega (VITAFOL GUMMIES) 3.33-0.333-34.8 MG  CHEW Chew 3 each by mouth daily. (Patient not taking: Reported on 05/17/2019) 90 tablet 12   No facility-administered medications prior to visit.    No Known Allergies  ROS Review of Systems  Constitutional: Positive for fatigue.  Endocrine: Positive for cold intolerance.      Objective:    Physical Exam  Constitutional: She is oriented to person, place, and time. She appears well-developed and well-nourished.  HENT:  Head: Normocephalic.  Eyes: Pupils are equal, round, and reactive to light. EOM are normal.  Cardiovascular: Normal rate and regular rhythm.  Pulmonary/Chest: Effort normal and breath sounds normal.  Abdominal: Soft. Bowel sounds are normal.   Musculoskeletal:        General: Normal range of motion.     Cervical back: Normal range of motion and neck supple.  Neurological: She is alert and oriented to person, place, and time. She has normal reflexes.  Skin: Skin is warm and dry.  Psychiatric: She has a normal mood and affect. Her behavior is normal. Judgment and thought content normal.    BP 110/68 (BP Location: Right Arm, Patient Position: Sitting, Cuff Size: Normal)   Pulse 73   Temp (!) 97.3 F (36.3 C) (Temporal)   Ht 5' 5"  (1.651 m)   Wt 176 lb 9.6 oz (80.1 kg)   LMP 05/03/2019 (Exact Date)   SpO2 96%   Breastfeeding Yes   BMI 29.39 kg/m  Wt Readings from Last 3 Encounters:  08/26/19 176 lb 9.6 oz (80.1 kg)  07/06/19 176 lb (79.8 kg)  05/17/19 187 lb 9.8 oz (85.1 kg)     There are no preventive care reminders to display for this patient.  There are no preventive care reminders to display for this patient.  Lab Results  Component Value Date   TSH 0.889 02/22/2019   Lab Results  Component Value Date   WBC 9.8 05/04/2019   HGB 10.8 (L) 05/04/2019   HCT 33.2 (L) 05/04/2019   MCV 87.6 05/04/2019   PLT 253 05/04/2019   Lab Results  Component Value Date   NA 139 02/22/2019   K 4.0 02/22/2019   CO2 20 02/22/2019   GLUCOSE 77 02/22/2019   BUN 6 02/22/2019   CREATININE 0.38 (L) 05/03/2019   BILITOT <0.2 02/22/2019   ALKPHOS 35 (L) 02/22/2019   AST 11 02/22/2019   ALT 7 02/22/2019   PROT 6.2 02/22/2019   ALBUMIN 3.5 (L) 02/22/2019   CALCIUM 8.7 02/22/2019   ANIONGAP 10 06/02/2018   No results found for: CHOL No results found for: HDL No results found for: LDLCALC No results found for: TRIG No results found for: [facility] Lab Results  Component Value Date   HGBA1C 5.2 12/04/2018      Assessment & Plan:  Kathleen Nguyen was seen today for burning with urination.  Diagnoses and all orders for this visit:  Burning with urination -     POCT URINALYSIS DIP (CLINITEK) Results are negative for UTI. She  drinks greater than 64 oz of water a day. Complaining of burning with urination, tired and fatigue.  Other iron deficiency anemia History of anemia may be underlying source fatigue Discussed Iron rich foods such as shellfish,liver, organ meats(liver, gizzard), and red meats can increase cholesterol and should be consumed in moderation.However; legumes(beans), spinach, pumpkin seeds, [place], broccoli, tofu, green leafy vegetables and dark chocolate can be consumed without concern to cholesterol.   -     CBC with Differential  Fatigue, unspecified type  may be  underlying source fatigue -     TSH + free T4  Constipation, unspecified constipation type Instead of medication she will increase her daily intake of fruits, vegetables and fibers -grains   No orders of the defined types were placed in this encounter.   Follow-up: Return if symptoms worsen or fail to improve, for annual physical.    Kerin Perna, NP

## 2019-08-26 NOTE — Patient Instructions (Signed)
Anemia  Anemia is a condition in which you do not have enough red blood cells or hemoglobin. Hemoglobin is a substance in red blood cells that carries oxygen. When you do not have enough red blood cells or hemoglobin (are anemic), your body cannot get enough oxygen and your organs may not work properly. As a result, you may feel very tired or have other problems. What are the causes? Common causes of anemia include:  Excessive bleeding. Anemia can be caused by excessive bleeding inside or outside the body, including bleeding from the intestine or from periods in women.  Poor nutrition.  Long-lasting (chronic) kidney, thyroid, and liver disease.  Bone marrow disorders.  Cancer and treatments for cancer.  HIV (human immunodeficiency virus) and AIDS (acquired immunodeficiency syndrome).  Treatments for HIV and AIDS.  Spleen problems.  Blood disorders.  Infections, medicines, and autoimmune disorders that destroy red blood cells. What are the signs or symptoms? Symptoms of this condition include:  Minor weakness.  Dizziness.  Headache.  Feeling heartbeats that are irregular or faster than normal (palpitations).  Shortness of breath, especially with exercise.  Paleness.  Cold sensitivity.  Indigestion.  Nausea.  Difficulty sleeping.  Difficulty concentrating. Symptoms may occur suddenly or develop slowly. If your anemia is mild, you may not have symptoms. How is this diagnosed? This condition is diagnosed based on:  Blood tests.  Your medical history.  A physical exam.  Bone marrow biopsy. Your health care provider may also check your stool (feces) for blood and may do additional testing to look for the cause of your bleeding. You may also have other tests, including:  Imaging tests, such as a CT scan or MRI.  Endoscopy.  Colonoscopy. How is this treated? Treatment for this condition depends on the cause. If you continue to lose a lot of blood, you may  need to be treated at a hospital. Treatment may include:  Taking supplements of iron, vitamin S31, or folic acid.  Taking a hormone medicine (erythropoietin) that can help to stimulate red blood cell growth.  Having a blood transfusion. This may be needed if you lose a lot of blood.  Making changes to your diet.  Having surgery to remove your spleen. Follow these instructions at home:  Take over-the-counter and prescription medicines only as told by your health care provider.  Take supplements only as told by your health care provider.  Follow any diet instructions that you were given.  Keep all follow-up visits as told by your health care provider. This is important. Contact a health care provider if:  You develop new bleeding anywhere in the body. Get help right away if:  You are very weak.  You are short of breath.  You have pain in your abdomen or chest.  You are dizzy or feel faint.  You have trouble concentrating.  You have bloody or black, tarry stools.  You vomit repeatedly or you vomit up blood. Summary  Anemia is a condition in which you do not have enough red blood cells or enough of a substance in your red blood cells that carries oxygen (hemoglobin).  Symptoms may occur suddenly or develop slowly.  If your anemia is mild, you may not have symptoms.  This condition is diagnosed with blood tests as well as a medical history and physical exam. Other tests may be needed.  Treatment for this condition depends on the cause of the anemia. This information is not intended to replace advice given to you by  your health care provider. Make sure you discuss any questions you have with your health care provider. Document Revised: 05/23/2017 Document Reviewed: 07/12/2016 Elsevier Patient Education  [place] rich foods such as shellfish,liver, organ meats(liver, gizzard), and red meats can increase cholesterol and should be consumed in  moderation.However; legumes(beans), spinach, pumpkin seeds, [place], broccoli, tofu, green leafy vegetables and dark chocolate can be consumed without concern to cholesterol.

## 2019-08-26 NOTE — Progress Notes (Signed)
Pt states she has been having tachycardia fatigue and insomnia

## 2019-08-27 LAB — CBC WITH DIFFERENTIAL/PLATELET
Basophils Absolute: 0 10*3/uL (ref 0.0–0.2)
Basos: 1 %
EOS (ABSOLUTE): 0.1 10*3/uL (ref 0.0–0.4)
Eos: 1 %
Hematocrit: 40 % (ref 34.0–46.6)
Hemoglobin: 12.8 g/dL (ref 11.1–15.9)
Immature Grans (Abs): 0 10*3/uL (ref 0.0–0.1)
Immature Granulocytes: 0 %
Lymphocytes Absolute: 1.8 10*3/uL (ref 0.7–3.1)
Lymphs: 43 %
MCH: 28.2 pg (ref 26.6–33.0)
MCHC: 32 g/dL (ref 31.5–35.7)
MCV: 88 fL (ref 79–97)
Monocytes Absolute: 0.3 10*3/uL (ref 0.1–0.9)
Monocytes: 7 %
Neutrophils Absolute: 2.1 10*3/uL (ref 1.4–7.0)
Neutrophils: 48 %
Platelets: 338 10*3/uL (ref 150–450)
RBC: 4.54 x10E6/uL (ref 3.77–5.28)
RDW: 14.1 % (ref 11.7–15.4)
WBC: 4.3 10*3/uL (ref 3.4–10.8)

## 2019-08-27 LAB — TSH+FREE T4
Free T4: 0.92 ng/dL (ref 0.82–1.77)
TSH: 0.631 u[IU]/mL (ref 0.450–4.500)

## 2019-08-30 NOTE — Progress Notes (Deleted)
Office Visit Note  Patient: Kathleen Nguyen             Date of Birth: 1985-12-23           MRN: [id number]             PCP: Clent Demark, PA-C Referring: Clent Demark, PA-C Visit Date: 09/06/2019 Occupation: @GUAROCC @  Subjective:  No chief complaint on file.   History of Present Illness: Kathleen Nguyen is a 34 y.o. female ***   Activities of Daily Living:  Patient reports morning stiffness for *** {minute/hour:19697}.   Patient {ACTIONS;DENIES/REPORTS:21021675::"Denies"} nocturnal pain.  Difficulty dressing/grooming: {ACTIONS;DENIES/REPORTS:21021675::"Denies"} Difficulty climbing stairs: {ACTIONS;DENIES/REPORTS:21021675::"Denies"} Difficulty getting out of chair: {ACTIONS;DENIES/REPORTS:21021675::"Denies"} Difficulty using hands for taps, buttons, cutlery, and/or writing: {ACTIONS;DENIES/REPORTS:21021675::"Denies"}  No Rheumatology ROS completed.   PMFS History:  Patient Active Problem List   Diagnosis Date Noted  . 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 Anemia in pregnancy 12/07/2018  . Language barrier 12/04/2018  . History of cesarean delivery 12/04/2018  . Ankylosing spondylitis ([place]) 11/11/2018  . Uterine adenomyoma 12/11/2016    Past Medical History:  Diagnosis Date  . Anemia   . Ankylosing spondylitis ([place])   . Ankylosing spondylitis ([place])   . Rheumatoid arthritis ([place])     Family History  Problem Relation Age of Onset  . Arthritis/Rheumatoid Mother    Past Surgical History:  Procedure Laterality Date  . CESAREAN SECTION    . CESAREAN SECTION    . CESAREAN SECTION N/A 05/03/2019   Procedure: CESAREAN SECTION;  Surgeon: Woodroe Mode, MD;  Location: [facility] LD ORS;  Service: Obstetrics;  Laterality: N/A;   Social History   Social History Narrative  . Not on file   Immunization History  Administered Date(s) Administered  . Influenza,inj,Quad PF,6+ Mos 02/22/2019  . Tdap 12/18/2017, 02/22/2019     Objective: Vital Signs: There were no  vitals taken for this visit.   Physical Exam   Musculoskeletal Exam: ***  CDAI Exam: CDAI Score: - Patient Global: -; Provider Global: - Swollen: -; Tender: - Joint Exam 09/06/2019   No joint exam has been documented for this visit   There is currently no information documented on the homunculus. Go to the Rheumatology activity and complete the homunculus joint exam.  Investigation: No additional findings.  Imaging: No results found.  Recent Labs: Lab Results  Component Value Date   WBC 4.3 08/26/2019   HGB 12.8 08/26/2019   PLT 338 08/26/2019   NA 139 02/22/2019   K 4.0 02/22/2019   CL 103 02/22/2019   CO2 20 02/22/2019   GLUCOSE 77 02/22/2019   BUN 6 02/22/2019   CREATININE 0.38 (L) 05/03/2019   BILITOT <0.2 02/22/2019   ALKPHOS 35 (L) 02/22/2019   AST 11 02/22/2019   ALT 7 02/22/2019   PROT 6.2 02/22/2019   ALBUMIN 3.5 (L) 02/22/2019   CALCIUM 8.7 02/22/2019   GFRAA >60 05/03/2019   QFTBGOLDPLUS NEGATIVE 12/28/2018    Speciality Comments: No specialty comments available.  Procedures:  No procedures performed Allergies: Patient has no known allergies.   Assessment / Plan:     Visit Diagnoses: No diagnosis found.  Orders: No orders of the defined types were placed in this encounter.  No orders of the defined types were placed in this encounter.   Face-to-face time spent with patient was *** minutes. Greater than 50% of time was spent in counseling and coordination of care.  Follow-Up Instructions: No follow-ups on file.   Geni Bers  Quita Skye, PA-C  Note - This record has been created using [employer].  Chart creation errors have been sought, but may not always  have been located. Such creation errors do not reflect on  the standard of medical care.

## 2019-09-06 ENCOUNTER — Ambulatory Visit: Payer: BC Managed Care – PPO | Admitting: Rheumatology

## 2019-09-09 ENCOUNTER — Telehealth (INDEPENDENT_AMBULATORY_CARE_PROVIDER_SITE_OTHER): Payer: Self-pay

## 2019-09-09 NOTE — Telephone Encounter (Signed)
Call placed using pacific interpreter [phone]). Patient verified date of birth. She is aware that all labs were normal. She asked while on call if it was safe for her to get the Covid- 19 vaccine while she Is breast feeding. Consulted with medical director who informed that the CDC now recommends it. Information relayed to patient. She verbalized understanding. Nat Christen, CMA

## 2019-09-09 NOTE — Telephone Encounter (Signed)
-----   Message from Kerin Perna, NP sent at 08/27/2019 11:45 AM EST ----- All labs were normal

## 2019-09-18 ENCOUNTER — Ambulatory Visit: Payer: BC Managed Care – PPO | Attending: Internal Medicine

## 2019-09-18 DIAGNOSIS — Z23 Encounter for immunization: Secondary | ICD-10-CM

## 2019-09-18 NOTE — Progress Notes (Signed)
   Covid-19 Vaccination Clinic  Name:  Kathleen Nguyen    MRN: [id number] DOB: October 15, 1985  09/18/2019  Kathleen Nguyen was observed post Covid-19 immunization for 15 minutes without incident. She was provided with Vaccine Information Sheet and instruction to access the V-Safe system.   Kathleen Nguyen was instructed to call 911 with any severe reactions post vaccine: Marland Kitchen Difficulty breathing  . Swelling of face and throat  . A fast heartbeat  . A bad rash all over body  . Dizziness and weakness

## 2019-09-29 NOTE — Congregational Nurse Program (Signed)
  Dept: [place] Nurse Program Note  Date of Encounter: 09/29/2019  Past Medical History: Past Medical History:  Diagnosis Date  . Anemia   . Ankylosing spondylitis ([place])   . Ankylosing spondylitis ([place])   . Rheumatoid arthritis [facility])     Encounter Details: CNP Questionnaire - 09/29/19 1711      Questionnaire   Patient Status  Immigrant    Race  African    Location Patient Served At  [employer]    Uninsured  Not Applicable    Food  No food insecurities    Housing/Utilities  Yes, have permanent housing    Transportation  No transportation needs    Interpersonal Safety  Yes, feel physically and emotionally safe where you currently live    Medication  No medication insecurities    Medical Provider  Yes    Referrals  Other    ED Visit Averted  Not Applicable    Life-Saving Intervention Made  Not Applicable      Client requested blood pressure screening. 112/74 and pule 80. She has no other health complaints. She wanted to know how to take a first aid class for information to take care of her family. We found first aid certification online. She also wanted help placing a call for paying a healthcare bill. Assisted client with call. Marguerite Olea, RN, BSN, CNP [phone]

## 2019-11-12 NOTE — Progress Notes (Signed)
Office Visit Note  Patient: Kathleen Nguyen             Date of Birth: [birth year]           MRN: [id number]             PCP: Clent Demark, PA-C Referring: Clent Demark, PA-C Visit Date: 11/16/2019 Occupation: '@GUAROCC'$ @  Subjective:  Medication monitoring   History of Present Illness: Kathleen Nguyen is a 34 y.o. female with history of ankylosing spondylitis.  Patient is prescribed Cimzia 200 mg sq injections every 14 days.  She has been off of Cimzia for 1 month due to needing to update lab work and have refills sent to the pharmacy.  She reports that Cimzia has been very effective at controlling her arthritis.  She has not had any recent flares.  She experiences intermittent thoracic pain but denies any neck or lower back pain at this time.  She denies any SI joint pain.  She denies any other joint pain or joint swelling at this time.  She denies any Achilles tendinitis or plantar fasciitis.  Activities of Daily Living:  Patient reports morning stiffness for 1 minute.   Patient Denies nocturnal pain.  Difficulty dressing/grooming: Denies Difficulty climbing stairs: Denies Difficulty getting out of chair: Denies Difficulty using hands for taps, buttons, cutlery, and/or writing: Reports  Review of Systems  Constitutional: Positive for fatigue.  HENT: Positive for mouth dryness. Negative for mouth sores and nose dryness.   Eyes: Positive for dryness. Negative for pain, itching and visual disturbance.  Respiratory: Negative for cough, hemoptysis, shortness of breath and difficulty breathing.   Cardiovascular: Negative for chest pain, palpitations, hypertension and swelling in legs/feet.  Gastrointestinal: Negative for blood in stool, constipation and diarrhea.  Endocrine: Negative for increased urination.  Genitourinary: Negative for difficulty urinating and painful urination.  Musculoskeletal: Positive for arthralgias, joint pain and morning stiffness. Negative for joint  swelling, myalgias, muscle weakness, muscle tenderness and myalgias.  Skin: Negative for color change, pallor, rash, hair loss, nodules/bumps, skin tightness, ulcers and sensitivity to sunlight.  Allergic/Immunologic: Negative for susceptible to infections.  Neurological: Negative for numbness and memory loss.  Hematological: Negative for bruising/bleeding tendency and swollen glands.  Psychiatric/Behavioral: Negative for depressed mood, confusion and sleep disturbance. The patient is not nervous/anxious.     PMFS History:  Patient Active Problem List   Diagnosis Date Noted  . 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 Anemia in pregnancy 12/07/2018  . Language barrier 12/04/2018  . History of cesarean delivery 12/04/2018  . Ankylosing spondylitis ([place]) 11/11/2018  . Uterine adenomyoma 12/11/2016    Past Medical History:  Diagnosis Date  . Anemia   . Ankylosing spondylitis ([place])   . Ankylosing spondylitis ([place])   . Rheumatoid arthritis ([place])     Family History  Problem Relation Age of Onset  . Arthritis/Rheumatoid Mother    Past Surgical History:  Procedure Laterality Date  . CESAREAN SECTION    . CESAREAN SECTION    . CESAREAN SECTION N/A 05/03/2019   Procedure: CESAREAN SECTION;  Surgeon: Woodroe Mode, MD;  Location: [facility] LD ORS;  Service: Obstetrics;  Laterality: N/A;   Social History   Social History Narrative  . Not on file   Immunization History  Administered Date(s) Administered  . Influenza,inj,Quad PF,6+ Mos 02/22/2019  . Janssen (J&J) SARS-COV-2 Vaccination 09/18/2019  . Tdap 12/18/2017, 02/22/2019     Objective: Vital Signs: BP 102/71 (BP Location: Left Arm, Patient Position: Sitting, Cuff  Size: Normal)   Pulse 81   Resp 12   Ht _0  (1.651 m)   Wt 179 lb 3.2 oz (81.3 kg)   BMI 29.82 kg/m    Physical Exam Vitals and nursing note reviewed.  Constitutional:      Appearance: She is well-developed.  HENT:     Head: Normocephalic and atraumatic.    Eyes:     Conjunctiva/sclera: Conjunctivae normal.  Pulmonary:     Effort: Pulmonary effort is normal.  Abdominal:     General: Bowel sounds are normal.     Palpations: Abdomen is soft.  Musculoskeletal:     Cervical back: Normal range of motion.  Lymphadenopathy:     Cervical: No cervical adenopathy.  Skin:    General: Skin is warm and dry.     Capillary Refill: Capillary refill takes less than 2 seconds.  Neurological:     Mental Status: She is alert and oriented to person, place, and time.  Psychiatric:        Behavior: Behavior normal.      Musculoskeletal Exam: C-spine, thoracic spine, and lumbar spine good ROM with no discomfort.  Midline spinal tenderness in the thoracic region.  No SI joint tenderness.  Shoulder joints, elbow joints, wrist joints, MCPs, PIPs and DIPs good range of motion with no synovitis.  She has complete fist formation bilaterally.  Hip joints have good range of motion with no discomfort.  Knee joints and ankle joints have good range of motion with no discomfort.  No warmth or effusion of knee joints noted.  No tenderness or inflammation of ankle joints.  No Achilles tendinitis or plantar fasciitis.  CDAI Exam: CDAI Score: -- Patient Global: --; Provider Global: -- Swollen: --; Tender: -- Joint Exam 11/16/2019   No joint exam has been documented for this visit   There is currently no information documented on the homunculus. Go to the Rheumatology activity and complete the homunculus joint exam.  Investigation: No additional findings.  Imaging: No results found.  Recent Labs: Lab Results  Component Value Date   WBC 4.3 08/26/2019   HGB 12.8 08/26/2019   PLT 338 08/26/2019   NA 139 02/22/2019   K 4.0 02/22/2019   CL 103 02/22/2019   CO2 20 02/22/2019   GLUCOSE 77 02/22/2019   BUN 6 02/22/2019   CREATININE 0.38 (L) 05/03/2019   BILITOT <0.2 02/22/2019   ALKPHOS 35 (L) 02/22/2019   AST 11 02/22/2019   ALT 7 02/22/2019   PROT 6.2  02/22/2019   ALBUMIN 3.5 (L) 02/22/2019   CALCIUM 8.7 02/22/2019   GFRAA >60 05/03/2019   QFTBGOLDPLUS NEGATIVE 12/28/2018    Speciality Comments: No specialty comments available.  Procedures:  No procedures performed Allergies: Patient has no known allergies.   Assessment / Plan:     Visit Diagnoses: Ankylosing spondylitis of lumbosacral region [facility]): She has not had any recent flares.  She has good ROM of the C-spine, thoracic spine, and lumbar spine on exam.  She has midline thoracic spinal tenderness.  No SI joint tenderness noted.  She has no synovitis or dactylitis on exam.  She has no achilles tendonitis or plantar fasciitis.  She has been off of Cimzia for the past 1 month due to needing to update lab work and requiring a refill.  She was clinically doing well on Cimzia 200 mg sq injections every 14 days.  CBC and CMP were drawn today.  A refill of cimzia was sent to the pharmacy.  She was advised to notify us if she develops signs or symptoms of a flare.  She will follow up in 5 months.- Plan: Certolizumab Pegol 2 X 200 MG/ML KIT  High risk medication use - Cimzia 200 mg sq injections every 14 days.  CBC and CMP were drawn today to monitor for drug toxicity.  Orders were released.  She will require updated lab work in August and every 3 months to monitor for drug toxicity.  TB gold was negative on 12/28/18. Future order for TB gold was placed today.  She has not had any recent infections. - Plan: CBC with Differential/Platelet, COMPLETE METABOLIC PANEL WITH GFR, QuantiFERON-TB Gold Plus Sacroiliitis (HCC)  Dry eyes: She has chronic eye dryness.    Hair loss: Resolved.   Other medical conditions are listed as follows:   Uterine adenomyoma  Tinea pedis of left foot  Language barrier  Miscarriage  Orders: Orders Placed This Encounter  Procedures  . CBC with Differential/Platelet  . COMPLETE METABOLIC PANEL WITH GFR  . QuantiFERON-TB Gold Plus   Meds ordered this encounter   Medications  . Certolizumab Pegol 2 X 200 MG/ML KIT    Sig: Inject 200 mg into the skin every 14 (fourteen) days.    Dispense:  1 kit    Refill:  2      Follow-Up Instructions: Return in about 5 months (around 04/17/2020) for Ankylosing Spondylitis.  Hazel Sams, PA-C  I examined and evaluated the patient with Hazel Sams PA.  Patient has been off Cimzia for a month.  She has been experiencing increasing stiffness and pain in her lumbar spine.  She had no synovitis on examination.  She will resume Cimzia.  The plan of care was discussed as noted above.  Bo Merino, MD Note - This record has been created using [employer].  Chart creation errors have been sought, but may not always  have been located. Such creation errors do not reflect on  the standard of medical care.

## 2019-11-16 ENCOUNTER — Telehealth: Payer: Self-pay

## 2019-11-16 ENCOUNTER — Other Ambulatory Visit: Payer: Self-pay

## 2019-11-16 ENCOUNTER — Encounter: Payer: Self-pay | Admitting: Rheumatology

## 2019-11-16 ENCOUNTER — Ambulatory Visit (INDEPENDENT_AMBULATORY_CARE_PROVIDER_SITE_OTHER): Payer: BC Managed Care – PPO | Admitting: Rheumatology

## 2019-11-16 VITALS — BP 102/71 | HR 81 | Resp 12 | Ht 65.0 in | Wt 179.2 lb

## 2019-11-16 DIAGNOSIS — B353 Tinea pedis: Secondary | ICD-10-CM

## 2019-11-16 DIAGNOSIS — M457 Ankylosing spondylitis of lumbosacral region: Secondary | ICD-10-CM | POA: Diagnosis not present

## 2019-11-16 DIAGNOSIS — L659 Nonscarring hair loss, unspecified: Secondary | ICD-10-CM

## 2019-11-16 DIAGNOSIS — Z79899 Other long term (current) drug therapy: Secondary | ICD-10-CM

## 2019-11-16 DIAGNOSIS — M461 Sacroiliitis, not elsewhere classified: Secondary | ICD-10-CM

## 2019-11-16 DIAGNOSIS — O039 Complete or unspecified spontaneous abortion without complication: Secondary | ICD-10-CM | POA: Diagnosis not present

## 2019-11-16 DIAGNOSIS — D269 Other benign neoplasm of uterus, unspecified: Secondary | ICD-10-CM

## 2019-11-16 DIAGNOSIS — H04123 Dry eye syndrome of bilateral lacrimal glands: Secondary | ICD-10-CM

## 2019-11-16 DIAGNOSIS — Z789 Other specified health status: Secondary | ICD-10-CM | POA: Diagnosis not present

## 2019-11-16 LAB — COMPLETE METABOLIC PANEL WITH GFR
AG Ratio: 1.5 (calc) (ref 1.0–2.5)
ALT: 15 U/L (ref 6–29)
AST: 13 U/L (ref 10–30)
Albumin: 4.3 g/dL (ref 3.6–5.1)
Alkaline phosphatase (APISO): 69 U/L (ref 31–125)
BUN: 13 mg/dL (ref 7–25)
CO2: 30 mmol/L (ref 20–32)
Calcium: 9.5 mg/dL (ref 8.6–10.2)
Chloride: 102 mmol/L (ref 98–110)
Creat: 0.54 mg/dL (ref 0.50–1.10)
GFR, Est African American: 144 mL/min/{1.73_m2} (ref >=60)
GFR, Est Non African American: 124 mL/min/{1.73_m2} (ref >=60)
Globulin: 2.8 g/dL (calc) (ref 1.9–3.7)
Glucose, Bld: 85 mg/dL (ref 65–99)
Potassium: 4.2 mmol/L (ref 3.5–5.3)
Sodium: 138 mmol/L (ref 135–146)
Total Bilirubin: 0.3 mg/dL (ref 0.2–1.2)
Total Protein: 7.1 g/dL (ref 6.1–8.1)

## 2019-11-16 LAB — CBC WITH DIFFERENTIAL/PLATELET
Absolute Monocytes: 525 cells/uL (ref 200–950)
Basophils Absolute: 31 cells/uL (ref 0–200)
Basophils Relative: 0.6 %
Eosinophils Absolute: 151 cells/uL (ref 15–500)
Eosinophils Relative: 2.9 %
HCT: 40.8 % (ref 35.0–45.0)
Hemoglobin: 13 g/dL (ref 11.7–15.5)
Lymphs Abs: 2522 cells/uL (ref 850–3900)
MCH: 28.2 pg (ref 27.0–33.0)
MCHC: 31.9 g/dL — ABNORMAL LOW (ref 32.0–36.0)
MCV: 88.5 fL (ref 80.0–100.0)
MPV: 10 fL (ref 7.5–12.5)
Monocytes Relative: 10.1 %
Neutro Abs: 1971 cells/uL (ref 1500–7800)
Neutrophils Relative %: 37.9 %
Platelets: 311 10*3/uL (ref 140–400)
RBC: 4.61 10*6/uL (ref 3.80–5.10)
RDW: 12.4 % (ref 11.0–15.0)
Total Lymphocyte: 48.5 %
WBC: 5.2 10*3/uL (ref 3.8–10.8)

## 2019-11-16 LAB — TEST AUTHORIZATION: TEST CODE:: 36970

## 2019-11-16 MED ORDER — CERTOLIZUMAB PEGOL 2 X 200 MG/ML ~~LOC~~ KIT: 200.0000 mg | PACK | SUBCUTANEOUS | 2 refills | Status: DC

## 2019-11-16 NOTE — Progress Notes (Signed)
This order was canceled and placed as a future order.

## 2019-11-16 NOTE — Telephone Encounter (Signed)
Client called me requesting transportation assistance to and from rheumatologist office. I have called [employer] and scheduled a ride. Earlie Server Muhoro RN BSN PCCN  [id number]

## 2019-11-17 NOTE — Progress Notes (Signed)
CBC and CMP WNL

## 2019-12-07 ENCOUNTER — Other Ambulatory Visit: Payer: Self-pay | Admitting: *Deleted

## 2019-12-07 DIAGNOSIS — M457 Ankylosing spondylitis of lumbosacral region: Secondary | ICD-10-CM

## 2019-12-07 MED ORDER — CERTOLIZUMAB PEGOL 2 X 200 MG/ML ~~LOC~~ KIT: 200.0000 mg | PACK | SUBCUTANEOUS | 0 refills | Status: DC

## 2019-12-07 NOTE — Telephone Encounter (Signed)
Patient contacted the office requesting a refill on Cimzia   Last Visit: 11/16/2019 Next Visit: 04/17/2020 Labs: 11/16/2019 WNL TB Gold: 7/6/2021Neg   Current Dose per office note 11/16/2019: Cimzia 200 mg sq injections every 14 days  Okay to refill per Dr. Estanislado Pandy

## 2019-12-27 ENCOUNTER — Other Ambulatory Visit: Payer: Self-pay | Admitting: Rheumatology

## 2019-12-27 DIAGNOSIS — M457 Ankylosing spondylitis of lumbosacral region: Secondary | ICD-10-CM

## 2019-12-28 NOTE — Telephone Encounter (Signed)
Last Visit: 11/16/2019 Next Visit: 04/17/2020 Labs: 11/16/2019 WNL TB Gold: 7/6/2021Neg   Current Dose per office note 11/16/2019: Cimzia 200 mg sq injections every 14 days  Okay to refill per Dr. Estanislado Pandy

## 2020-01-18 ENCOUNTER — Telehealth: Payer: Self-pay

## 2020-01-18 NOTE — Congregational Nurse Program (Signed)
  Dept: [place] Nurse Program Note  Date of Encounter: 01/18/2020  Past Medical History: Past Medical History:  Diagnosis Date  . Anemia   . Ankylosing spondylitis ([employer])   . Ankylosing spondylitis ([place])   . Rheumatoid arthritis [facility])     Encounter Details:  CNP Questionnaire - 01/18/20 1123      Questionnaire   Patient Status Immigrant    Race African    Location Patient Served At [employer] No food insecurities    Housing/Utilities Yes, have permanent housing    Transportation No transportation needs    Interpersonal Safety Yes, feel physically and emotionally safe where you currently live    Medication No medication insecurities    Medical Provider Yes    Referrals Other    ED Visit Averted Not Applicable    Life-Saving Intervention Made Not Applicable          Client came to clinic for BP screening which was 102/69 and pulse 86. She also needed help with medicaid card. She has lost her card and needed help calling social services. CNP placed call to social services at [facility] [phone] and after two attempts, I left a message requesting a call back. Claudia Desanctis, BSN, CNP [phone]

## 2020-01-18 NOTE — Telephone Encounter (Signed)
Client requesting help getting medicaid card replaced. CNP spoke with Social Services and they need client to come to the office or call the social service office.She will try and call.  Gave client number to call. Marguerite Olea, RN, BSN, CNP [phone]

## 2020-03-22 DIAGNOSIS — H5371 Glare sensitivity: Secondary | ICD-10-CM | POA: Diagnosis not present

## 2020-03-22 DIAGNOSIS — H2000 Unspecified acute and subacute iridocyclitis: Secondary | ICD-10-CM | POA: Diagnosis not present

## 2020-03-22 DIAGNOSIS — H5712 Ocular pain, left eye: Secondary | ICD-10-CM | POA: Diagnosis not present

## 2020-04-03 NOTE — Progress Notes (Signed)
Office Visit Note  Patient: Kathleen Nguyen             Date of Birth: 1986/05/27           MRN: [id number]             PCP: Clent Demark, PA-C Referring: Clent Demark, PA-C Visit Date: 04/17/2020 Occupation: @GUAROCC @  Interpreter: Dorene Sorrow   Subjective:  Other (patient reports she has not taken cimzia in approximately 2 months. patient reports back pain )   History of Present Illness: Kathleen Nguyen is a 35 y.o. female with ankylosing spondylitis.  She states that due to insurance issues she discontinued Cimzia about 2 months ago.  She has been experiencing pain in her SI joints, upper back and her shoulders.  She also has discomfort in her knee joints and ankle joints.  She states her insurance issue is resolved now and she would like to start on Cimzia again.  Patient states about a month ago she developed redness and pain in her left eye.  She was seen by an ophthalmologist and was treated with prednisone eyedrops.  She does not recall what diagnosis was given.  The symptoms resolved.  She had first COVID-19 vaccination but has not received a booster yet.  Activities of Daily Living:  Patient reports morning stiffness for 0  minutes.   Patient Reports nocturnal pain.  Difficulty dressing/grooming: Reports Difficulty climbing stairs: Reports Difficulty getting out of chair: Reports Difficulty using hands for taps, buttons, cutlery, and/or writing: Denies  Review of Systems  Constitutional: Positive for fatigue.  HENT: Positive for mouth dryness. Negative for mouth sores and nose dryness.   Eyes: Positive for pain, redness and dryness. Negative for itching.  Respiratory: Negative for shortness of breath and difficulty breathing.   Cardiovascular: Negative for chest pain and palpitations.  Gastrointestinal: Positive for constipation. Negative for blood in stool and diarrhea.  Endocrine: Negative for increased urination.  Genitourinary: Positive for painful  urination. Negative for difficulty urinating.  Musculoskeletal: Positive for arthralgias, joint pain, joint swelling, myalgias, muscle tenderness and myalgias. Negative for morning stiffness.  Skin: Negative for color change, rash and redness.  Allergic/Immunologic: Negative for susceptible to infections.  Neurological: Positive for headaches and weakness. Negative for dizziness, numbness and memory loss.  Hematological: Negative for bruising/bleeding tendency.  Psychiatric/Behavioral: Negative for confusion.    PMFS History:  Patient Active Problem List   Diagnosis Date Noted  . 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 Anemia in pregnancy 12/07/2018  . Language barrier 12/04/2018  . History of cesarean delivery 12/04/2018  . Ankylosing spondylitis ([place]) 11/11/2018  . Uterine adenomyoma 12/11/2016    Past Medical History:  Diagnosis Date  . Anemia   . Ankylosing spondylitis ([place])   . Ankylosing spondylitis ([place])   . Rheumatoid arthritis ([place])     Family History  Problem Relation Age of Onset  . Arthritis/Rheumatoid Mother    Past Surgical History:  Procedure Laterality Date  . CESAREAN SECTION    . CESAREAN SECTION    . CESAREAN SECTION N/A 05/03/2019   Procedure: CESAREAN SECTION;  Surgeon: Woodroe Mode, MD;  Location: [facility] LD ORS;  Service: Obstetrics;  Laterality: N/A;   Social History   Social History Narrative  . Not on file   Immunization History  Administered Date(s) Administered  . Influenza,inj,Quad PF,6+ Mos 02/22/2019  . Janssen (J&J) SARS-COV-2 Vaccination 09/18/2019  . Tdap 12/18/2017, 02/22/2019     Objective: Vital Signs: BP 108/74 (BP  Location: Left Arm, Patient Position: Sitting, Cuff Size: Normal)   Pulse 81   Resp 15   Ht 5\' 5"  (1.651 m)   Wt 184 lb 3.2 oz (83.6 kg)   BMI 30.65 kg/m    Physical Exam Vitals and nursing note reviewed.  Constitutional:      Appearance: She is well-developed.  HENT:     Head: Normocephalic and atraumatic.    Eyes:     Conjunctiva/sclera: Conjunctivae normal.  Cardiovascular:     Rate and Rhythm: Normal rate and regular rhythm.     Heart sounds: Normal heart sounds.  Pulmonary:     Effort: Pulmonary effort is normal.     Breath sounds: Normal breath sounds.  Abdominal:     General: Bowel sounds are normal.     Palpations: Abdomen is soft.  Musculoskeletal:     Cervical back: Normal range of motion.  Lymphadenopathy:     Cervical: No cervical adenopathy.  Skin:    General: Skin is warm and dry.     Capillary Refill: Capillary refill takes less than 2 seconds.  Neurological:     Mental Status: She is alert and oriented to person, place, and time.  Psychiatric:        Behavior: Behavior normal.      Musculoskeletal Exam: C-spine was in good range of motion.  She had tenderness over thoracic region.  She had tenderness over left SI joint.  Hip joints knee joints, ankles and MTPs with good range of motion with no synovitis.  CDAI Exam: CDAI Score: 3.2  Patient Global: 6 mm; Provider Global: 6 mm Swollen: 0 ; Tender: 6  Joint Exam 04/17/2020      Right  Left  Thoracic Spine   Tender     Sacroiliac      Tender  Knee   Tender   Tender  Ankle   Tender   Tender     Investigation: No additional findings.  Imaging: No results found.  Recent Labs: Lab Results  Component Value Date   WBC 5.2 11/16/2019   HGB 13.0 11/16/2019   PLT 311 11/16/2019   NA 138 11/16/2019   K 4.2 11/16/2019   CL 102 11/16/2019   CO2 30 11/16/2019   GLUCOSE 85 11/16/2019   BUN 13 11/16/2019   CREATININE 0.54 11/16/2019   BILITOT 0.3 11/16/2019   ALKPHOS 35 (L) 02/22/2019   AST 13 11/16/2019   ALT 15 11/16/2019   PROT 7.1 11/16/2019   ALBUMIN 3.5 (L) 02/22/2019   CALCIUM 9.5 11/16/2019   GFRAA 144 11/16/2019   QFTBGOLDPLUS NEGATIVE 12/28/2018    Speciality Comments: No specialty comments available.  Procedures:  No procedures performed Allergies: Patient has no known allergies.    Assessment / Plan:     Visit Diagnoses: Ankylosing spondylitis of lumbosacral region (HCC)-patient is having a flare with increased pain and discomfort in multiple joints.  She states she had recent episode of eye inflammation which was treated with prednisone eyedrops.  She ran out of Cimzia due to insurance issues.  She wants to start on Cimzia now.  We will apply for Cimzia again.  Indications side effects contraindications of Cimzia were again reviewed.  High risk medication use - Cimzia 200 mg sq injections every 14 days.  - Plan: CBC with Differential/Platelet, COMPLETE METABOLIC PANEL WITH GFR, QuantiFERON-TB Gold Plus today and then every 3 months to monitor for drug toxicity.  Sacroiliitis (HCC)-she had tenderness on palpation over left SI joint.  Thoracic pain-she has been experiencing increased thoracic pain recently.  Pain in both knees-she has been experiencing increased pain in her bilateral knee joints.  Today she is having left knee joint pain.  Dry eyes-she reports having redness and discomfort in her left eye.  I suppose she had iritis.  She does not know the diagnosis.  Have advised her to bring that information at the next visit.  Uterine adenomyoma  Tinea pedis of left foot-resolved  Language barrier-interpreter was present during the conversation.  Educated about COVID-19 virus infection-she had first Covid vaccination.  Use of booster was discussed.  Instructions were given and placed in the AVS.  Use of monoclonal antibodies was also discussed in case she develops COVID-19 infection.  Orders: Orders Placed This Encounter  Procedures  . CBC with Differential/Platelet  . COMPLETE METABOLIC PANEL WITH GFR  . QuantiFERON-TB Gold Plus   No orders of the defined types were placed in this encounter.     Follow-Up Instructions: Return in about 3 months (around 07/18/2020) for Ankylosing spondylitis.   Bo Merino, MD  Note - This record has been created  using Editor, commissioning.  Chart creation errors have been sought, but may not always  have been located. Such creation errors do not reflect on  the standard of medical care.

## 2020-04-04 NOTE — Congregational Nurse Program (Signed)
  Dept: [facility] Nurse Program Note  Date of Encounter: 04/04/2020  Past Medical History: Past Medical History:  Diagnosis Date  . Anemia   . Ankylosing spondylitis ([place])   . Ankylosing spondylitis ([place])   . Rheumatoid arthritis ([place])     Encounter Details:  CNP Questionnaire - 04/04/20 1337      Questionnaire   Do you give verbal consent to treat you today? Yes    Visit Setting Other    Location Patient Served At [facility]    Patient Status Immigrant    Medical Provider Yes    Insurance Private Insurance    Intervention Assess (including screenings)          [facility]. Her blood pressure 111/78 and pulse 84. She has seen an eye docotr recently for dry eyes and will followup with this soon. CNP informed client of vaccine clinic next Tuesday 12-2 for flu vaccine.  Marguerite Olea, RN, BSN, CNP [phone]

## 2020-04-17 ENCOUNTER — Telehealth: Payer: Self-pay | Admitting: Pharmacy Technician

## 2020-04-17 ENCOUNTER — Other Ambulatory Visit: Payer: Self-pay

## 2020-04-17 ENCOUNTER — Ambulatory Visit (INDEPENDENT_AMBULATORY_CARE_PROVIDER_SITE_OTHER): Payer: 59 | Admitting: Rheumatology

## 2020-04-17 ENCOUNTER — Encounter: Payer: Self-pay | Admitting: Rheumatology

## 2020-04-17 VITALS — BP 108/74 | HR 81 | Resp 15 | Ht 65.0 in | Wt 184.2 lb

## 2020-04-17 DIAGNOSIS — D269 Other benign neoplasm of uterus, unspecified: Secondary | ICD-10-CM

## 2020-04-17 DIAGNOSIS — M457 Ankylosing spondylitis of lumbosacral region: Secondary | ICD-10-CM

## 2020-04-17 DIAGNOSIS — M461 Sacroiliitis, not elsewhere classified: Secondary | ICD-10-CM

## 2020-04-17 DIAGNOSIS — B353 Tinea pedis: Secondary | ICD-10-CM | POA: Diagnosis not present

## 2020-04-17 DIAGNOSIS — Z79899 Other long term (current) drug therapy: Secondary | ICD-10-CM | POA: Diagnosis not present

## 2020-04-17 DIAGNOSIS — L659 Nonscarring hair loss, unspecified: Secondary | ICD-10-CM

## 2020-04-17 DIAGNOSIS — Z789 Other specified health status: Secondary | ICD-10-CM | POA: Diagnosis not present

## 2020-04-17 DIAGNOSIS — Z7189 Other specified counseling: Secondary | ICD-10-CM

## 2020-04-17 DIAGNOSIS — M25562 Pain in left knee: Secondary | ICD-10-CM

## 2020-04-17 DIAGNOSIS — G8929 Other chronic pain: Secondary | ICD-10-CM | POA: Diagnosis not present

## 2020-04-17 DIAGNOSIS — M25561 Pain in right knee: Secondary | ICD-10-CM

## 2020-04-17 DIAGNOSIS — O039 Complete or unspecified spontaneous abortion without complication: Secondary | ICD-10-CM

## 2020-04-17 DIAGNOSIS — M546 Pain in thoracic spine: Secondary | ICD-10-CM

## 2020-04-17 DIAGNOSIS — H04123 Dry eye syndrome of bilateral lacrimal glands: Secondary | ICD-10-CM

## 2020-04-17 NOTE — Telephone Encounter (Signed)
Patient has new insurance.  Submitted a Prior Authorization request to [facility] for [facility] via Cover My Meds. Will update once we receive a response.   (KeyWilmon [place]i) - [id number]

## 2020-04-17 NOTE — Patient Instructions (Addendum)
COVID-19 vaccine recommendations:   COVID-19 vaccine is recommended for everyone (unless you are allergic to a vaccine component), even if you are on a medication that suppresses your immune system.   Do not take Tylenol or any anti-inflammatory medications (NSAIDs) 24 hours prior to the COVID-19 vaccination.   There is no direct evidence about the efficacy of the COVID-19 vaccine in individuals who are on medications that suppress the immune system.   Even if you are fully vaccinated, and you are on any medications that suppress your immune system, please continue to wear a mask, maintain at least six feet social distance and practice hand hygiene.   If you develop a COVID-19 infection, please contact your PCP or our office to determine if you need antibody infusion.  The booster vaccine is now available for immunocompromised patients.   Please see the following web sites for updated information.   [email or web address]   Standing Labs We placed an order today for your standing lab work.   Please have your standing labs drawn in 3 months after starting Cimzia injections and then every 3 months  If possible, please have your labs drawn 2 weeks prior to your appointment so that the provider can discuss your results at your appointment.  We have open lab daily Monday through Thursday from 8:30-12:30 PM and 1:30-4:30 PM and Friday from 8:30-12:30 PM and 1:30-4:00 PM at the office of Dr. Bo Merino, [facility].   Please be advised, patients with office appointments requiring lab work will take precedents over walk-in lab work.  If possible, please come for your lab work on Monday and Friday afternoons, as you may experience shorter wait times. The office is located at [address], [place], [place], [place] [address] No appointment is necessary.   Labs are drawn by [facility]. Please bring your co-pay at  the time of your lab draw.  You may receive a bill from [facility] for your lab work.  If you wish to have your labs drawn at another location, please call the office 24 hours in advance to send orders.  If you have any questions regarding directions or hours of operation,  please call [phone].   As a reminder, please drink plenty of water prior to coming for your lab work. Thanks!

## 2020-04-18 NOTE — Progress Notes (Signed)
CBC is normal, CMP is normal.

## 2020-04-19 LAB — COMPLETE METABOLIC PANEL WITH GFR
AG Ratio: 1.3 (calc) (ref 1.0–2.5)
ALT: 15 U/L (ref 6–29)
AST: 15 U/L (ref 10–30)
Albumin: 4.4 g/dL (ref 3.6–5.1)
Alkaline phosphatase (APISO): 60 U/L (ref 31–125)
BUN/Creatinine Ratio: 26 (calc) — ABNORMAL HIGH (ref 6–22)
BUN: 11 mg/dL (ref 7–25)
CO2: 28 mmol/L (ref 20–32)
Calcium: 9.3 mg/dL (ref 8.6–10.2)
Chloride: 102 mmol/L (ref 98–110)
Creat: 0.42 mg/dL — ABNORMAL LOW (ref 0.50–1.10)
GFR, Est African American: 155 mL/min/{1.73_m2} (ref >=60)
GFR, Est Non African American: 134 mL/min/{1.73_m2} (ref >=60)
Globulin: 3.3 g/dL (calc) (ref 1.9–3.7)
Glucose, Bld: 87 mg/dL (ref 65–99)
Potassium: 4.1 mmol/L (ref 3.5–5.3)
Sodium: 137 mmol/L (ref 135–146)
Total Bilirubin: 0.6 mg/dL (ref 0.2–1.2)
Total Protein: 7.7 g/dL (ref 6.1–8.1)

## 2020-04-19 LAB — CBC WITH DIFFERENTIAL/PLATELET
Absolute Monocytes: 409 cells/uL (ref 200–950)
Basophils Absolute: 19 cells/uL (ref 0–200)
Basophils Relative: 0.4 %
Eosinophils Absolute: 71 cells/uL (ref 15–500)
Eosinophils Relative: 1.5 %
HCT: 39.9 % (ref 35.0–45.0)
Hemoglobin: 13 g/dL (ref 11.7–15.5)
Lymphs Abs: 1640 cells/uL (ref 850–3900)
MCH: 28.3 pg (ref 27.0–33.0)
MCHC: 32.6 g/dL (ref 32.0–36.0)
MCV: 86.9 fL (ref 80.0–100.0)
MPV: 9.8 fL (ref 7.5–12.5)
Monocytes Relative: 8.7 %
Neutro Abs: 2562 cells/uL (ref 1500–7800)
Neutrophils Relative %: 54.5 %
Platelets: 325 10*3/uL (ref 140–400)
RBC: 4.59 10*6/uL (ref 3.80–5.10)
RDW: 12.5 % (ref 11.0–15.0)
Total Lymphocyte: 34.9 %
WBC: 4.7 10*3/uL (ref 3.8–10.8)

## 2020-04-19 LAB — QUANTIFERON-TB GOLD PLUS
Mitogen-NIL: >10 IU/mL
NIL: 0.03 IU/mL
QuantiFERON-TB Gold Plus: NEGATIVE
TB1-NIL: 0.02 IU/mL
TB2-NIL: 0 IU/mL

## 2020-04-19 MED ORDER — CIMZIA PREFILLED 2 X 200 MG/ML ~~LOC~~ KIT: PACK | SUBCUTANEOUS | 0 refills | Status: DC

## 2020-04-19 NOTE — Telephone Encounter (Signed)
Last Visit: 04/17/2020 Next Visit: 07/17/2020 Labs: 04/17/2020 CBC is normal, CMP is normal. TB Gold: 04/17/2020 Neg   Current Dose per office note 04/17/2020: Cimzia 200 mg sq injections every 14 days  DX: Ankylosing spondylitis of lumbosacral region   Okay to refill per Dr. Estanislado Pandy

## 2020-04-19 NOTE — Telephone Encounter (Signed)
Received notification from [facility] regarding a prior authorization for [facility]. Authorization has been APPROVED from 04/19/20 to 04/19/23.   Authorization # [id number]  Per plan, patient must fill through [facility]. Patient has Medicaid as secondary, so she should not need patient assistance.

## 2020-04-19 NOTE — Progress Notes (Signed)
TB Gold is negative.

## 2020-04-20 IMAGING — US US OB TRANSVAGINAL
1 series · 13 of 28 positions shown · non-contrast
Comparison: Pelvic ultrasound dated 05/28/2018 and MRI dated
12/10/2016

CLINICAL DATA: 32-year-old female with failed pregnancy diagnosed
on the ultrasound of 05/28/2018 presenting for follow-up. The
estimated gestational age based on the prior ultrasound is 7 weeks,
0 days.

EXAM:
OBSTETRIC <14 WK US AND TRANSVAGINAL OB US
TECHNIQUE: Both transabdominal and transvaginal ultrasound examinations were
performed for complete evaluation of the gestation as well as the
maternal uterus, adnexal regions, and pelvic cul-de-sac.
Transvaginal technique was performed to assess early pregnancy.

[Series 1: us ob transvaginal · 0.21mm/px · 41 acquisitions, 13 frames shown]
[im 2/41]
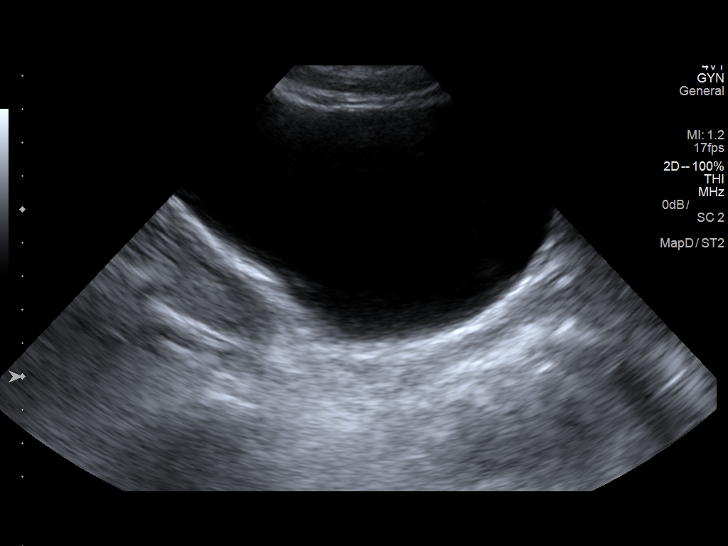
[im 5/41]
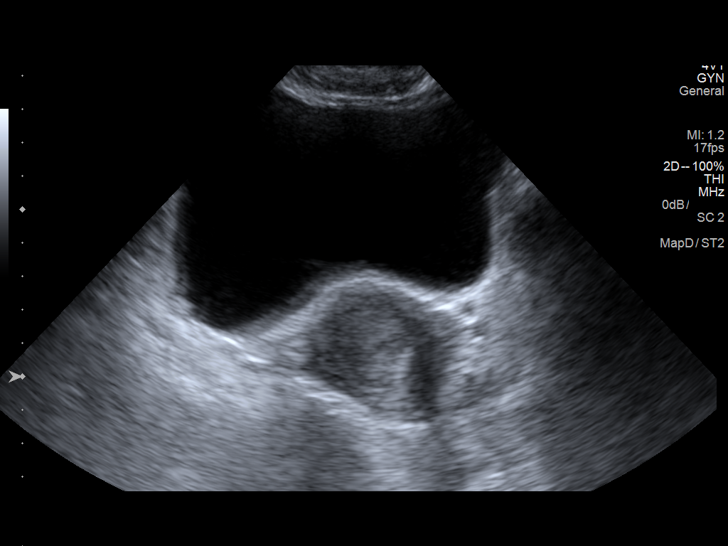
[im 8/41]
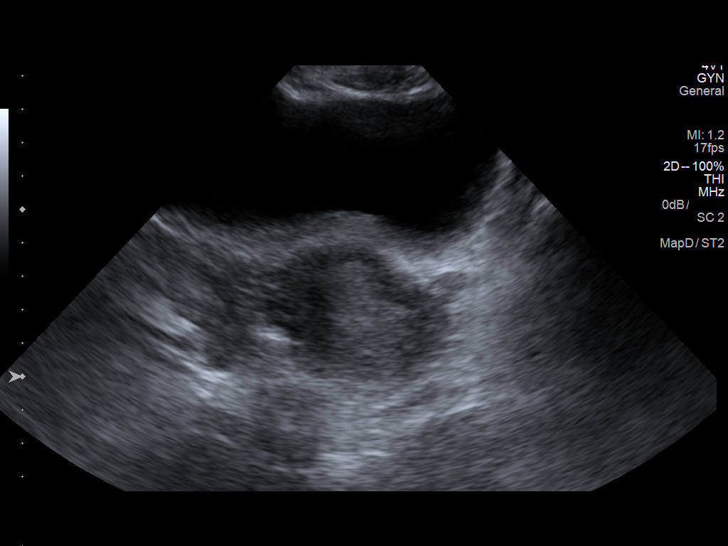
[im 11/41]
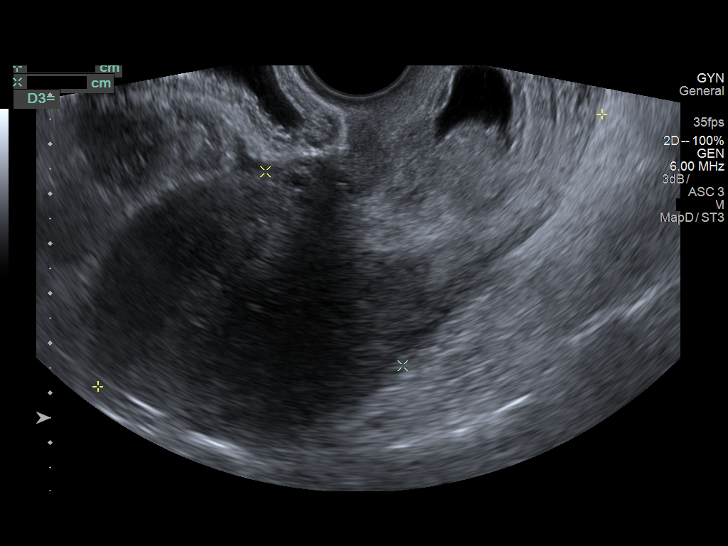
[im 14/41]
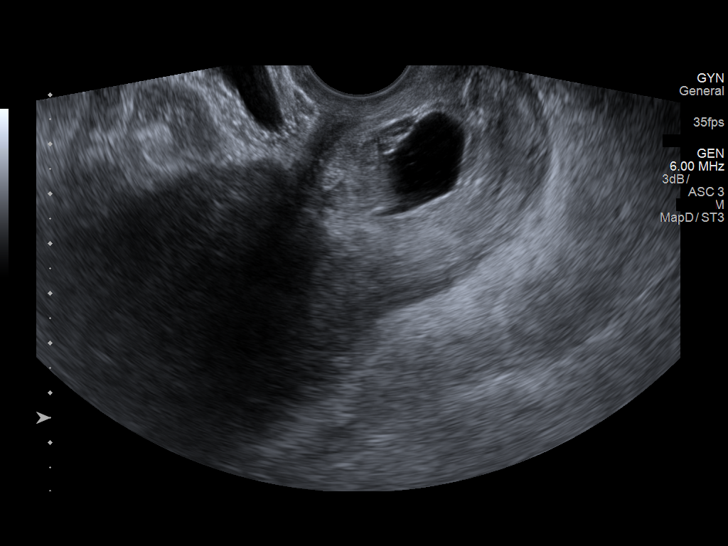
[im 17/41]
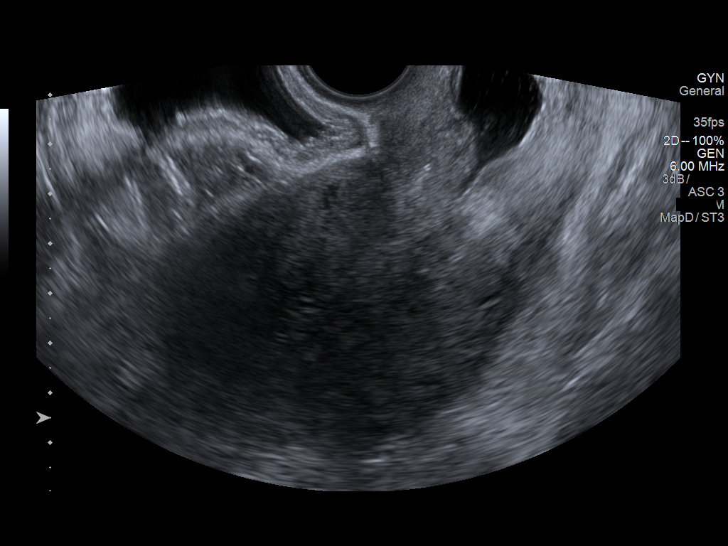
[im 21/41]
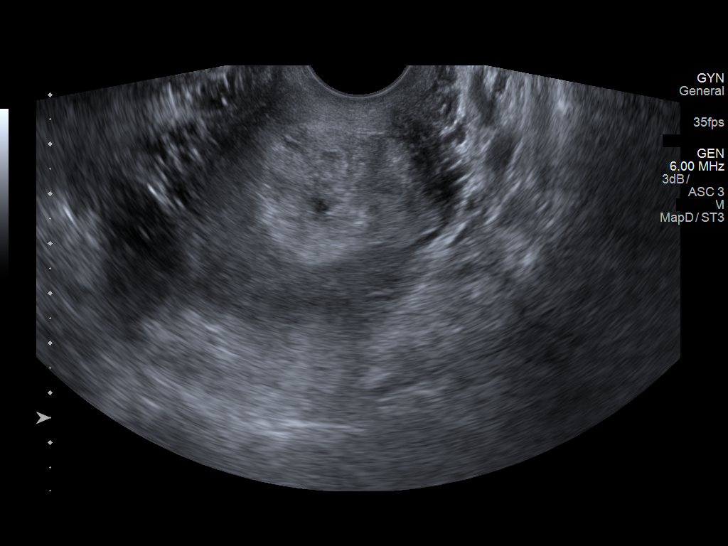
[im 24/41]
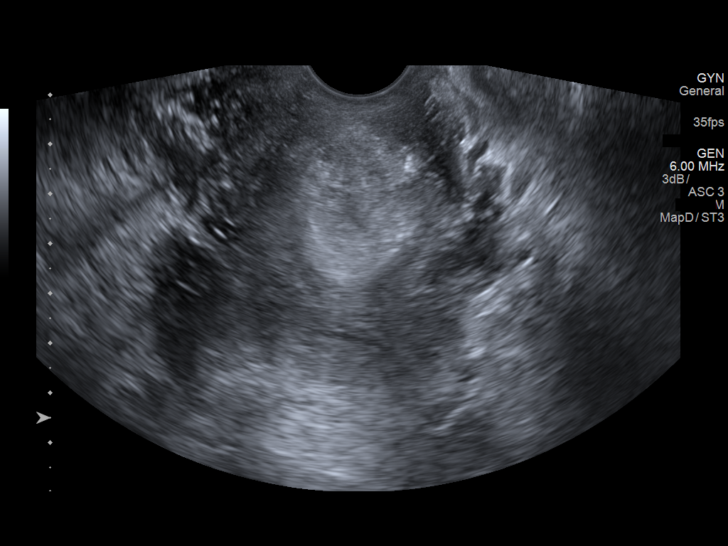
[im 27/41]
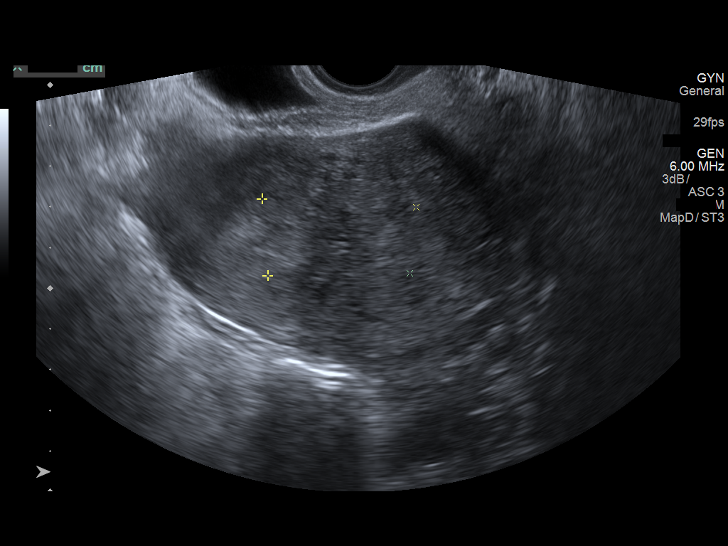
[im 30/41]
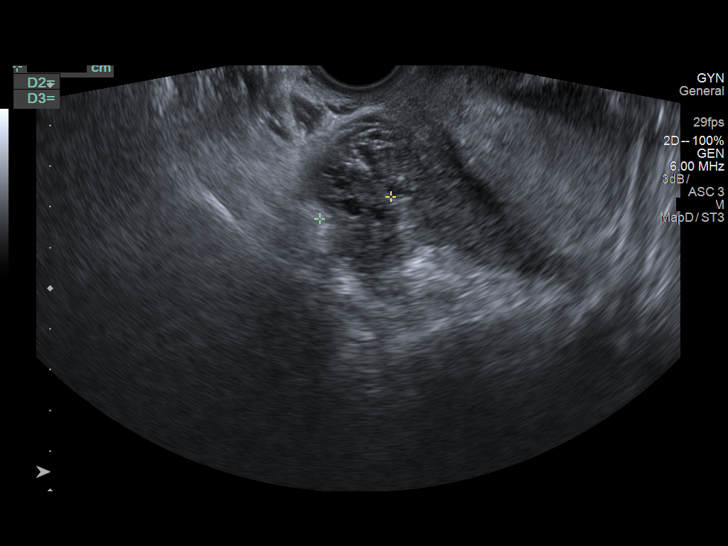
[im 33/41]
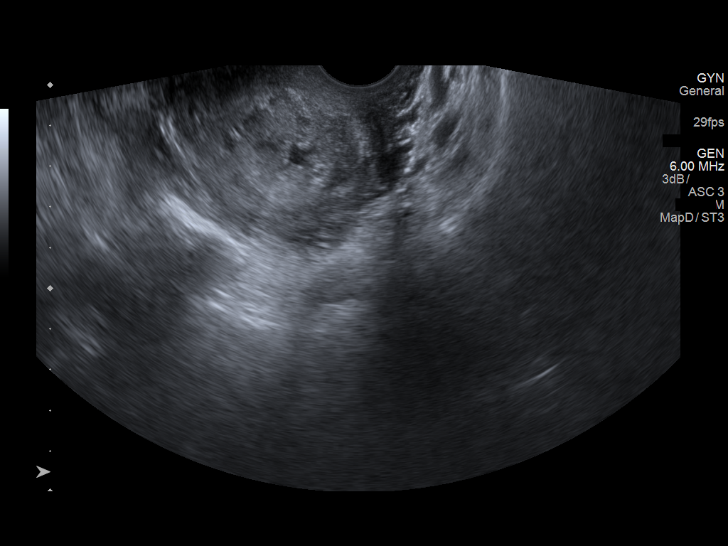
[im 36/41]
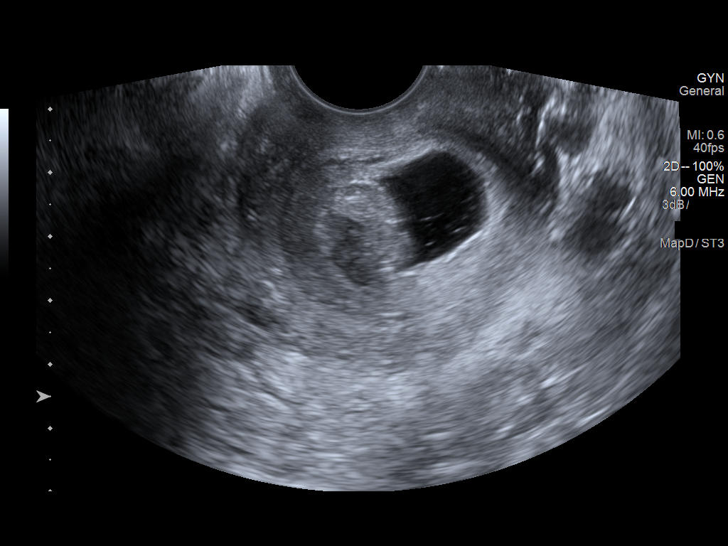
[im 39/41]
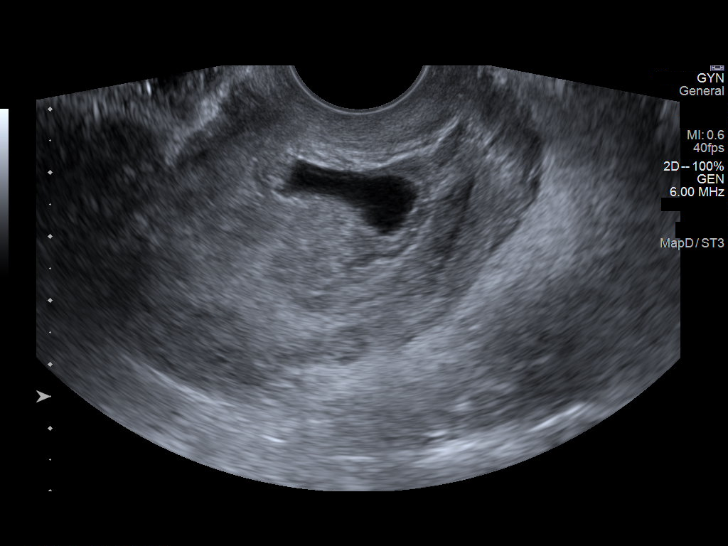

[13 of 28 positions shown; findings below may reference images not displayed]

FINDINGS: The uterus is anteverted and measures 8.0 x 4.8 x 11.5 cm. There is
heterogeneous appearance of the uterus with indistinct junctional
zone consistent with known adenomyosis.

There is a cystic structure in the cervical region measuring
approximately 2.4 cm corresponding to the gestational sac.
Heterogeneous complex content within the endocervix may represent
blood product. A 17 mm echogenic structure on cine image [DATE]
be outside of the sac and represent blood product and less likely a
fetal pole.

The estimated gestational age based on mean sac diameter of 2.4 cm
is 7 weeks, 4 days.

A 7 x 13 mm hypoechoic structure adjacent to the gestational sac may
represent a perigestational hemorrhage.

The right ovary is unremarkable and measures 1.8 x 3.6 x 2.2 cm. The
left ovary is not visualized.

No significant free fluid within pelvis.
IMPRESSION: The previously seen gestational sac is now located in the
endocervical region. There is probable small amount of blood product
surrounding the gestational sac.

## 2020-04-21 ENCOUNTER — Telehealth: Payer: Self-pay

## 2020-04-21 NOTE — Telephone Encounter (Signed)
Richardson Landry from [facility] left a voicemail regarding patient's prescription of Cimzia and need to verify that the patient has already had the starter dose and just needs the maintenance dose.  Also need to verify the patient's allergies.  Please call back  [phone] option 3   Richardson Landry states this is also a STAT request.

## 2020-04-21 NOTE — Telephone Encounter (Signed)
Spoke with Janett Billow and advised patient has already had the loading dose. Advised patient has no known allergies. They will work on getting the medication out to the patient.

## 2020-04-25 ENCOUNTER — Telehealth: Payer: Self-pay

## 2020-04-25 NOTE — Telephone Encounter (Signed)
Patient called stating [facility] told her they cannot fill her prescription of Cimzia due to her insurance and requesting a return call.  Patient also states she needs a sample of Cimzia.

## 2020-04-25 NOTE — Telephone Encounter (Signed)
Called pharmacy to check status, they left message for patient to set up 1st shipment. Called patient, left message.

## 2020-04-25 NOTE — Telephone Encounter (Signed)
Patient advised [facility] will reprocess and reach out to patient to schedule shipment. Patient advised she may come to the office for a sample.

## 2020-04-25 NOTE — Telephone Encounter (Signed)
Patient's primary insurance requires her to fill trough [employer]).  Called [facility], they state they advised patient that her secondary insurance was also requiring a PA. Provided secondary insurance PA approval info.  Received notification from [facility] regarding a prior authorization for [facility]. Authorization has been APPROVED from 04/25/20 to 04/25/21.   Authorization # [id number]  [facility] will reprocess and reach out to patient to schedule shipment.

## 2020-04-27 NOTE — Telephone Encounter (Signed)
Called [facility], patient's Cimzia prescription was shipped out on 04/26/20 and should deliver to patient today. Patient has a zero copay.  They provided Fedex tracking# [id number]  Order is out for delivery for today.

## 2020-05-03 NOTE — Congregational Nurse Program (Signed)
  Dept: [facility] Nurse Program Note  Date of Encounter: 05/03/2020  Past Medical History: Past Medical History:  Diagnosis Date  . Anemia   . Ankylosing spondylitis ([place])   . Ankylosing spondylitis ([place])   . Rheumatoid arthritis ([place])     Encounter Details:  CNP Questionnaire - 05/03/20 1542      Questionnaire   Do you give verbal consent to treat you today? Yes    Visit Setting Other    Location Patient Served At [facility]    Patient Status Immigrant    Medical Provider Yes    Insurance Private Insurance    Intervention Assess (including screenings)    Referrals Vaccination (Covid, Flu, Pneumonia, Shingles)          Client requested blood pressure to be checked. BP 106/71 and pulse 88. She is planning on getting her Covid booster. She received J&J for her first shot. She has gotten her Influenza vaccine.  Marguerite Olea, RN, BSN, CNP [phone]

## 2020-06-10 IMAGING — US US OB TRANSVAGINAL
1 series · 15 of 28 positions shown · non-contrast
Comparison: 05/18/2018

CLINICAL DATA: Bleeding.  Follow-up from 05/18/2018

EXAM:
TRANSVAGINAL OB ULTRASOUND
TECHNIQUE: Transvaginal ultrasound was performed for complete evaluation of the
gestation as well as the maternal uterus, adnexal regions, and
pelvic cul-de-sac.

[Series 1: us ob transvaginal · 44 acquisitions, 15 frames shown]
[im 1/44]
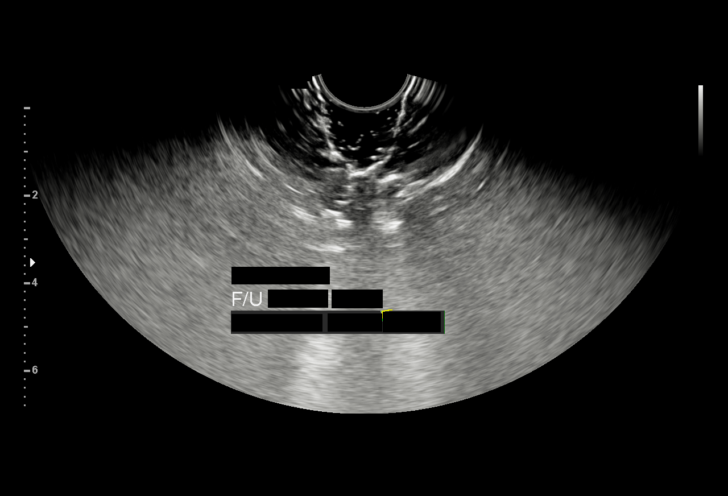
[im 4/44]
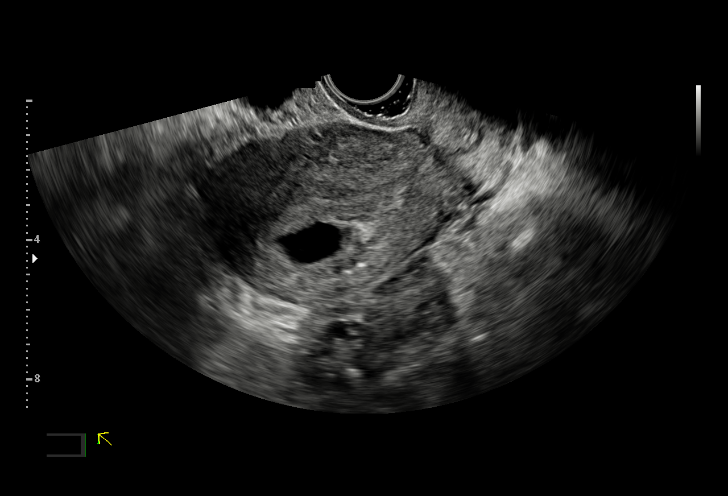
[im 7/44]
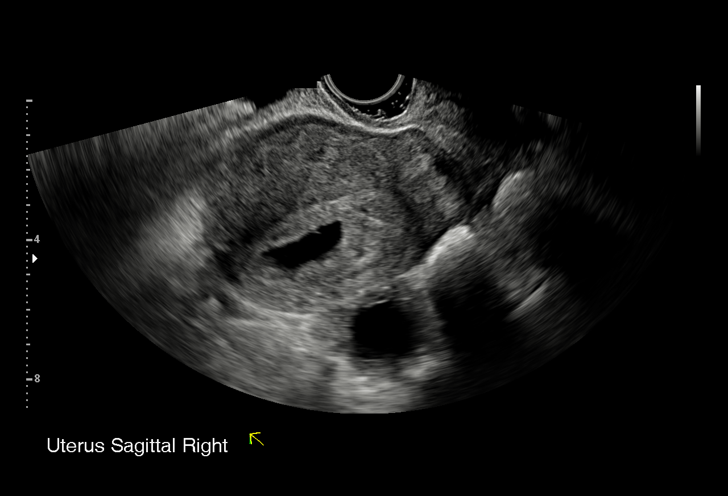
[im 10/44]
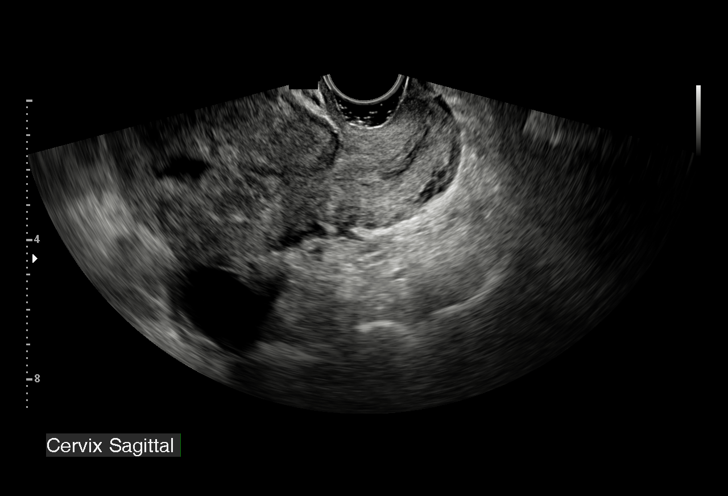
[im 13/44]
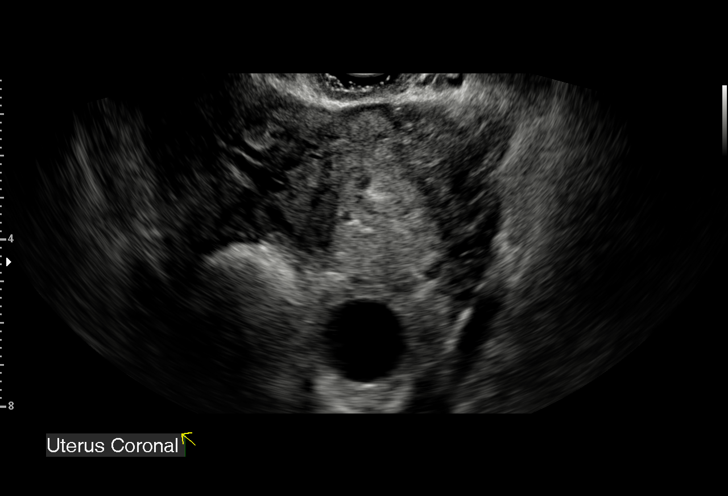
[im 16/44]
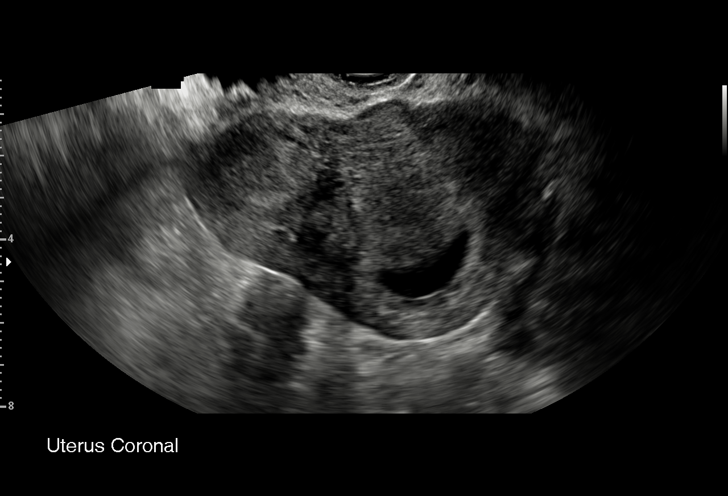
[im 20/44]
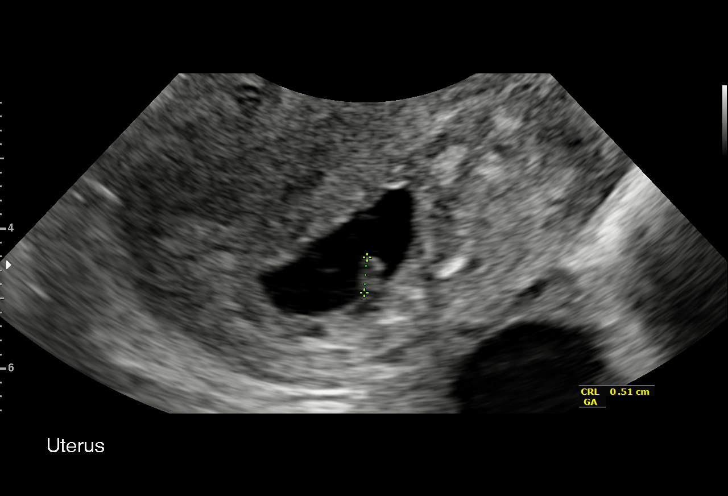
[im 23/44]
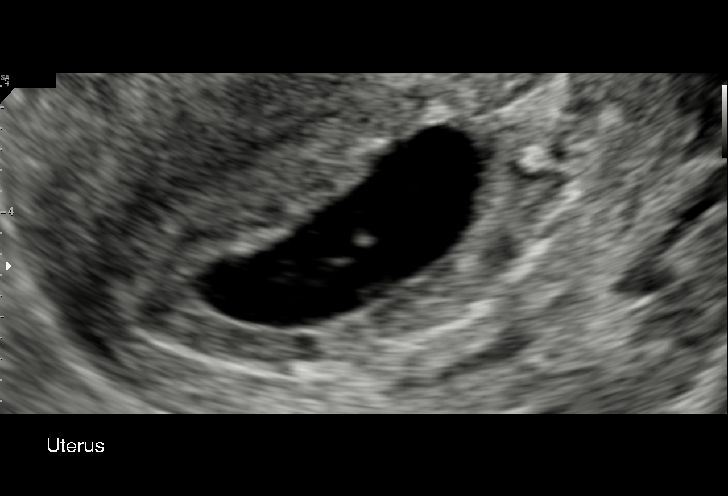
[im 24/44]
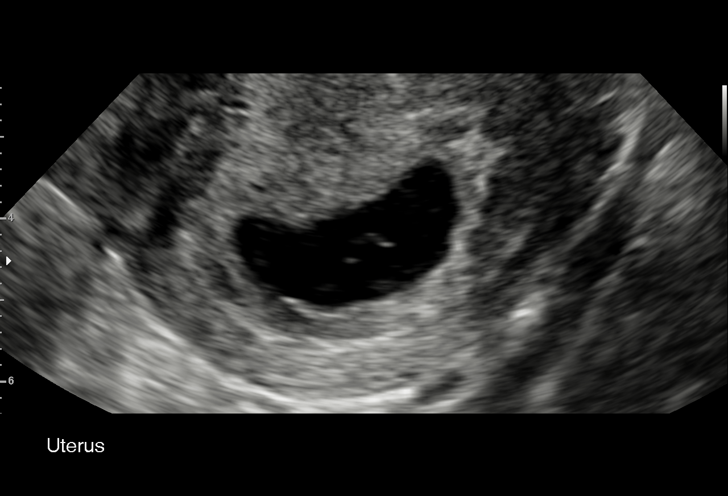
[im 28/44]
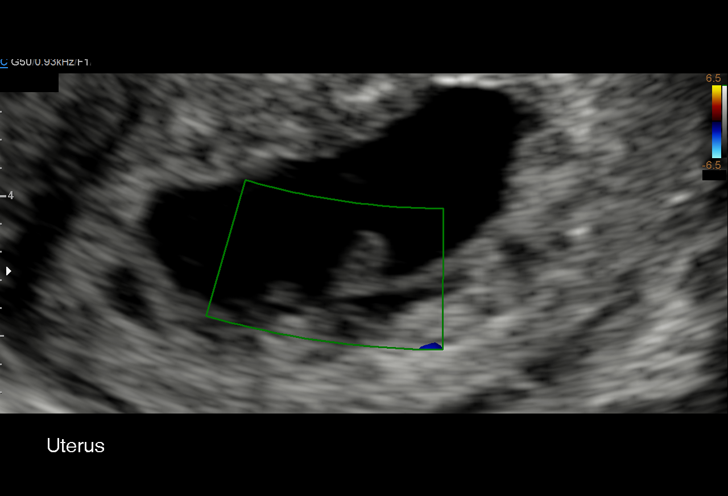
[im 31/44]
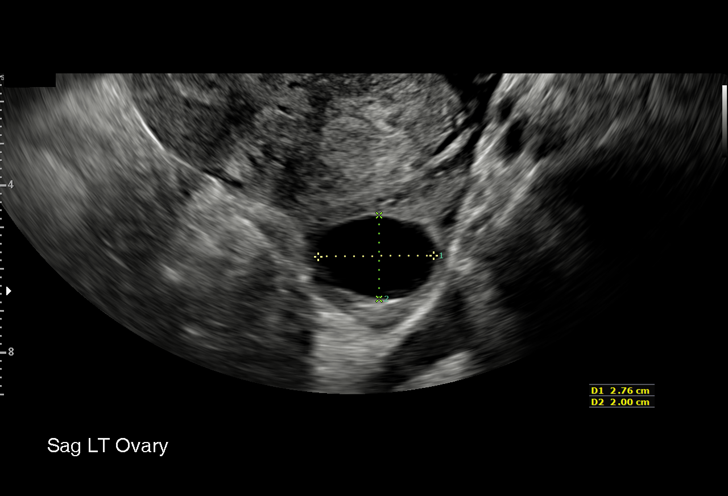
[im 34/44]
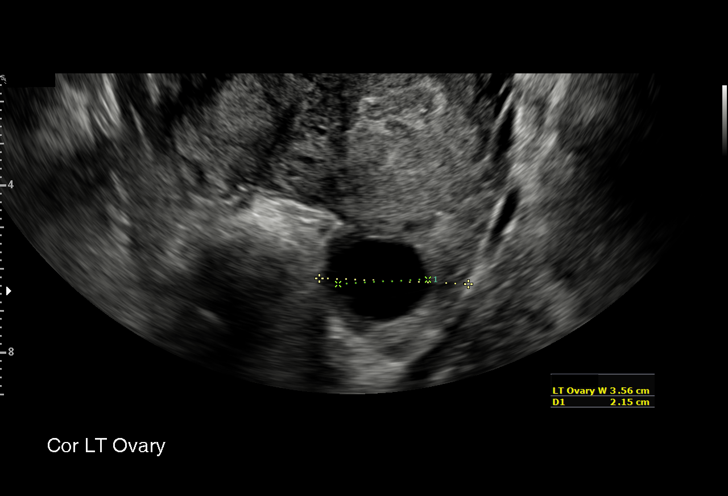
[im 37/44]
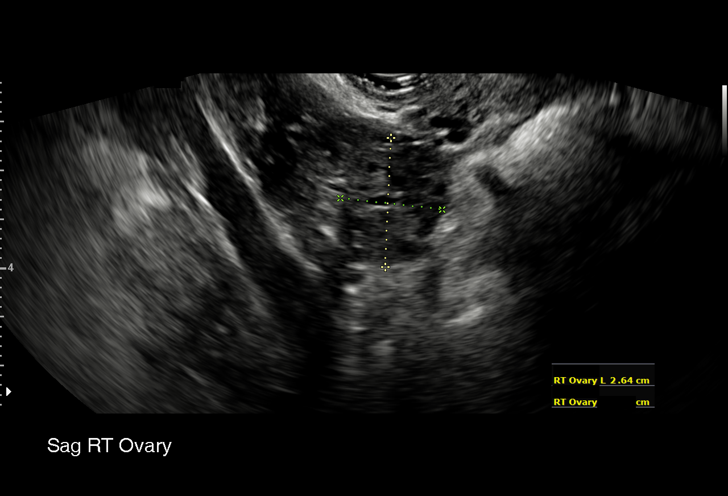
[im 40/44]
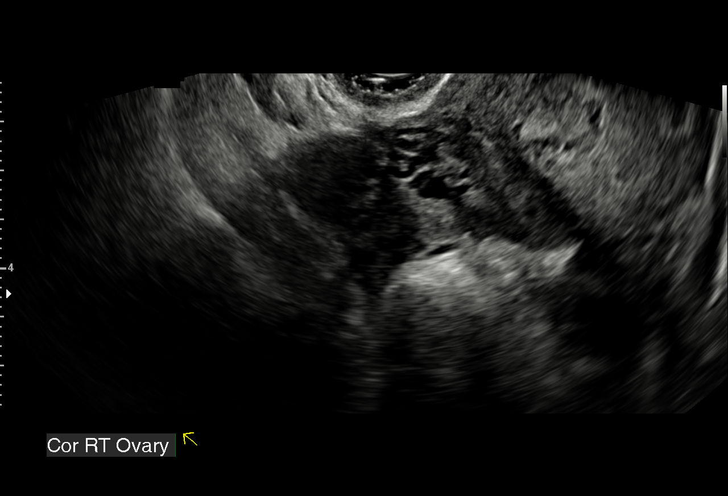
[im 44/44]
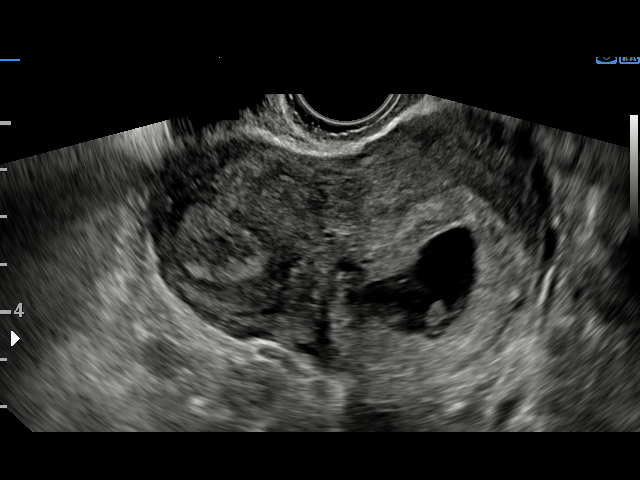

[15 of 28 positions shown; findings below may reference images not displayed]

FINDINGS: Intrauterine gestational sac: Single

Yolk sac:  Not well visualized

Embryo:  Visualized

Cardiac Activity: Not visualized

Heart Rate:  bpm

MSD:   mm    w     d

CRL:   5.1 mm   6 w 1 d                  US EDC: 01/23/2019

Subchorionic hemorrhage:  None visualized.

Maternal uterus/adnexae: No adnexal mass or free fluid.
IMPRESSION: Single intrauterine gestation, 6 weeks 1 day by crown-rump length.
No cardiac activity detected, and little change since prior
ultrasound performed 10 days ago. Findings meet definitive criteria
for failed pregnancy. This follows SRU consensus guidelines:
Diagnostic Criteria for Nonviable Pregnancy Early in the First
Trimester. N Engl J Med 5068;[DATE].

## 2020-06-16 IMAGING — US US OB TRANSVAGINAL
1 series · 15 of 28 positions shown · non-contrast
Comparison: 06/03/2018.

CLINICAL DATA: Failed pregnancy by previous ultrasound. Worsening
pain.

EXAM:
TRANSVAGINAL OB ULTRASOUND
TECHNIQUE: Transvaginal ultrasound was performed for complete evaluation of the
gestation as well as the maternal uterus, adnexal regions, and
pelvic cul-de-sac.

[Series 1: us ob transvaginal · 15 of 29 slices shown]
[im 1/29]
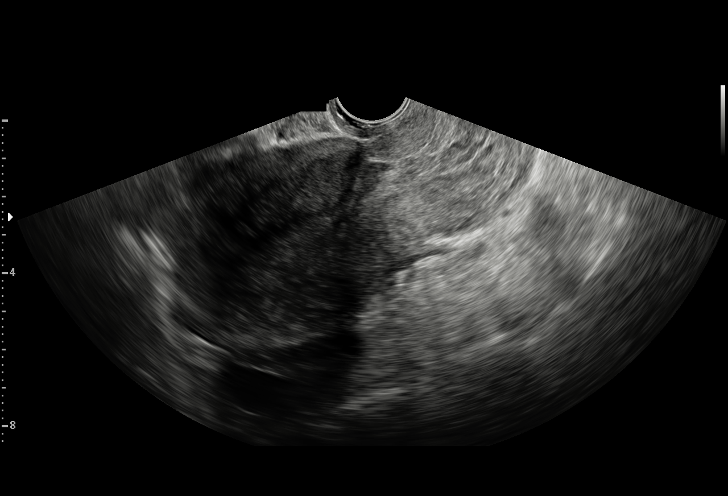
[im 3/29]
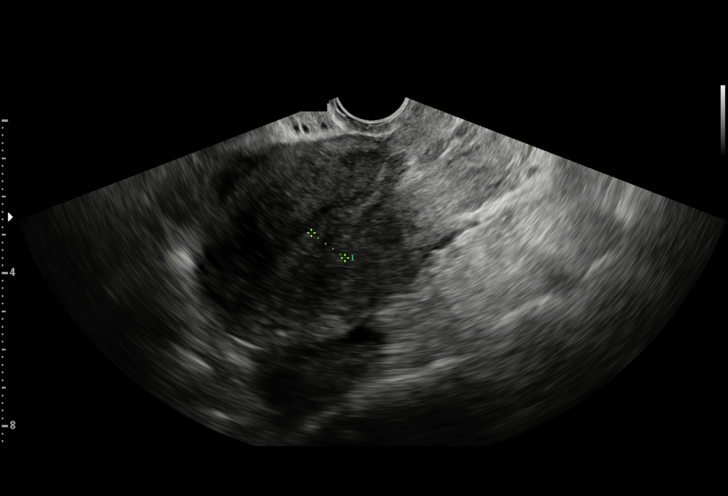
[im 5/29]
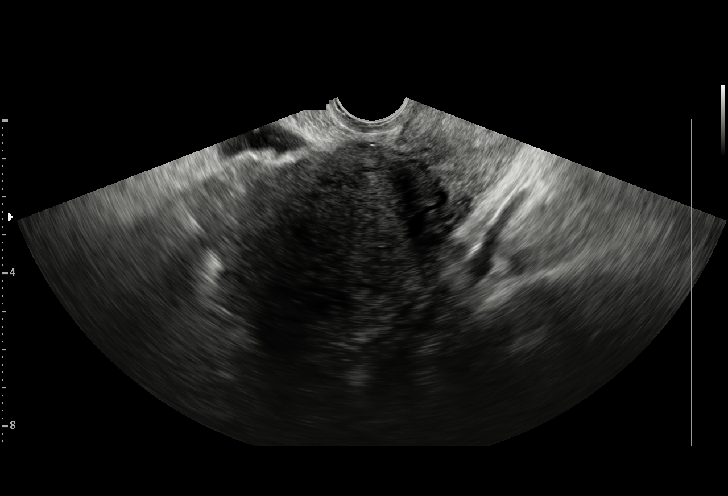
[im 7/29]
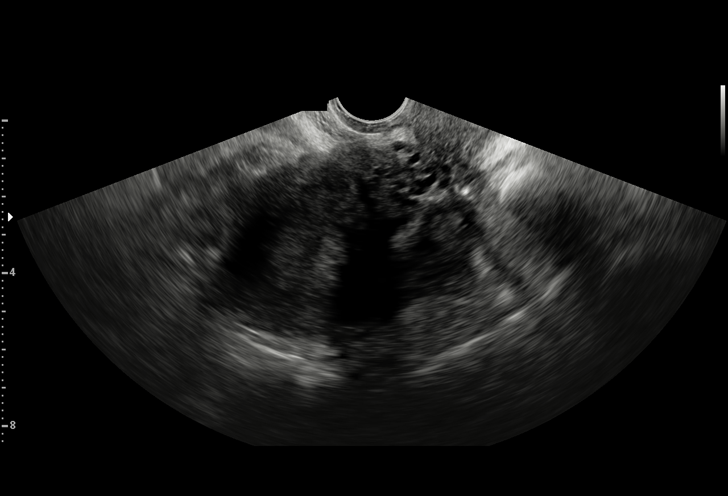
[im 9/29]
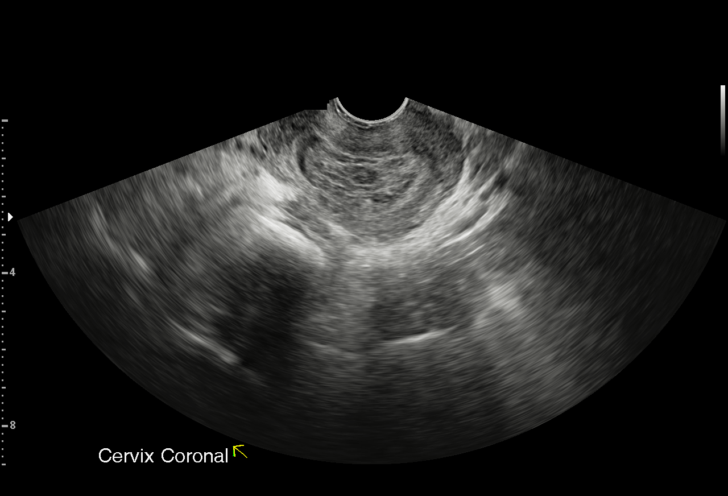
[im 11/29]
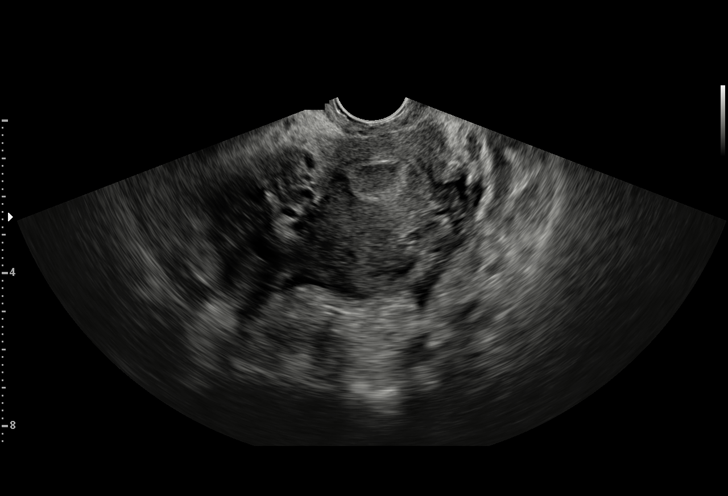
[im 13/29]
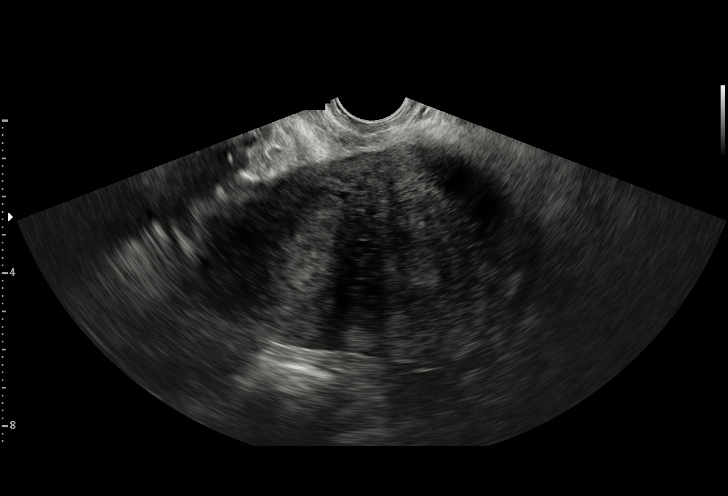
[im 15/29]
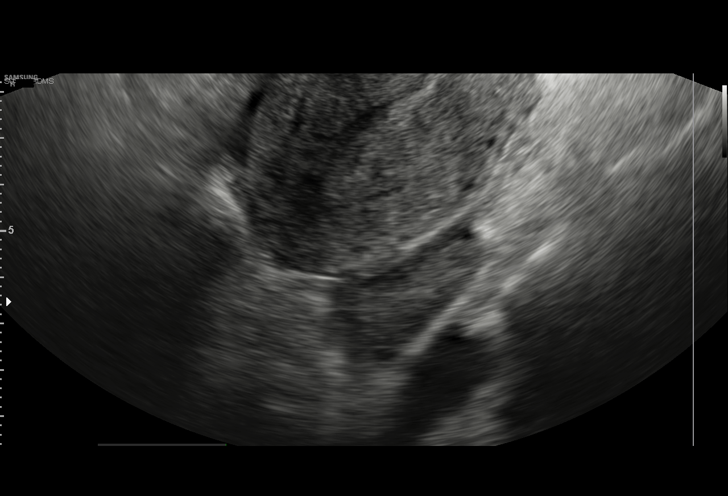
[im 16/29]
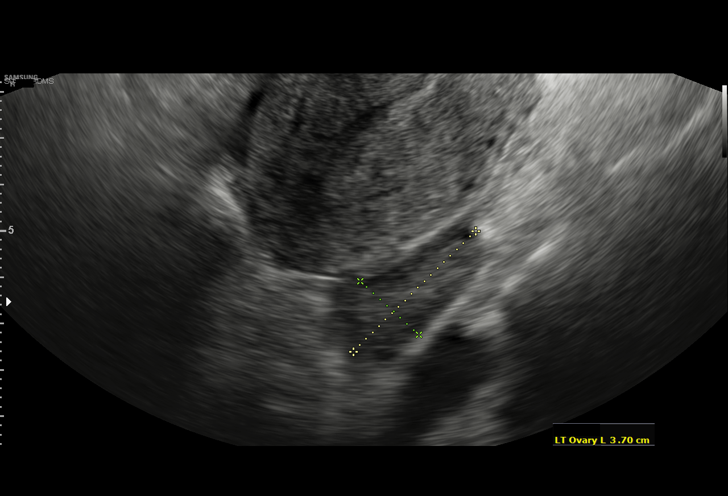
[im 18/29]
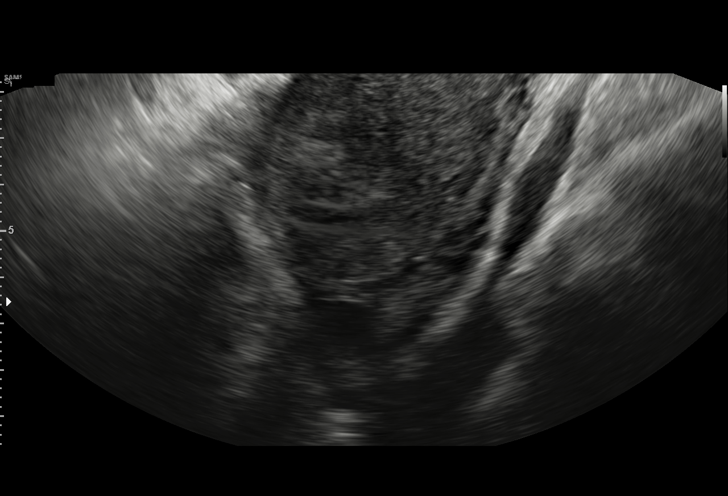
[im 20/29]
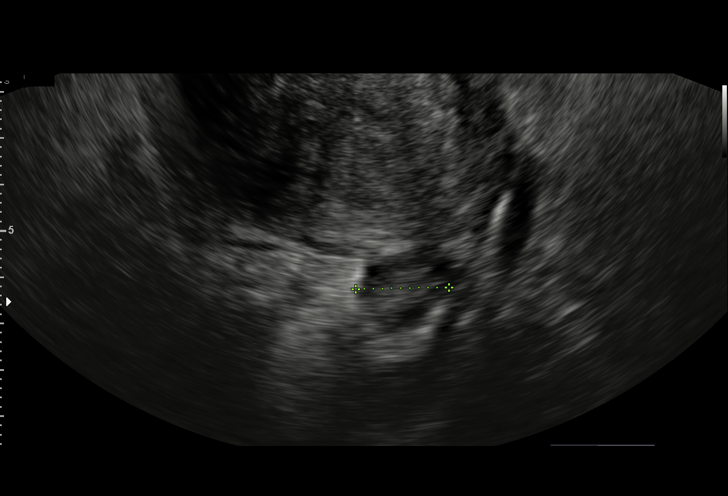
[im 22/29]
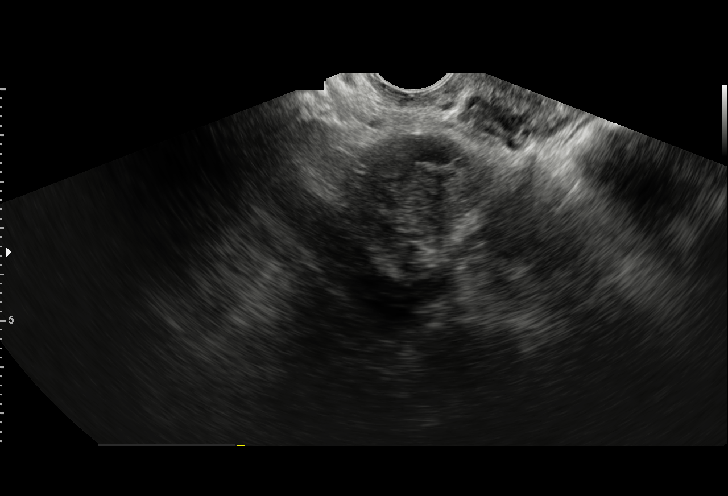
[im 24/29]
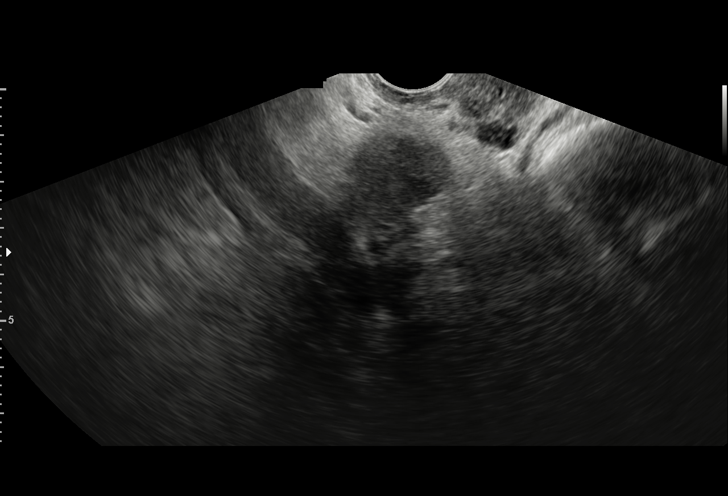
[im 26/29]
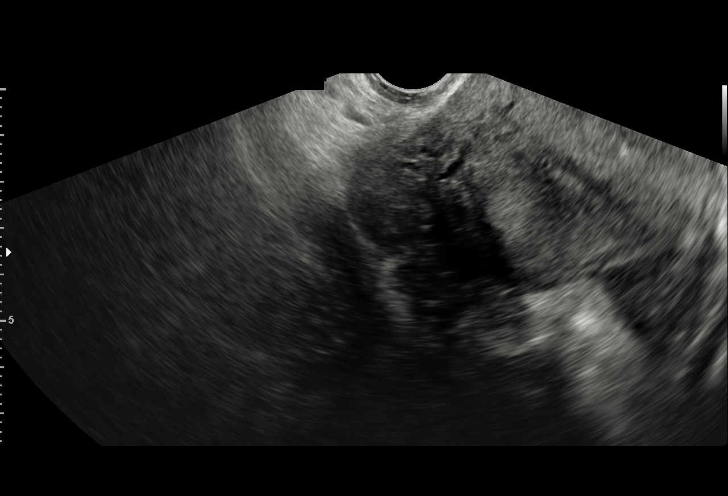
[im 29/29]
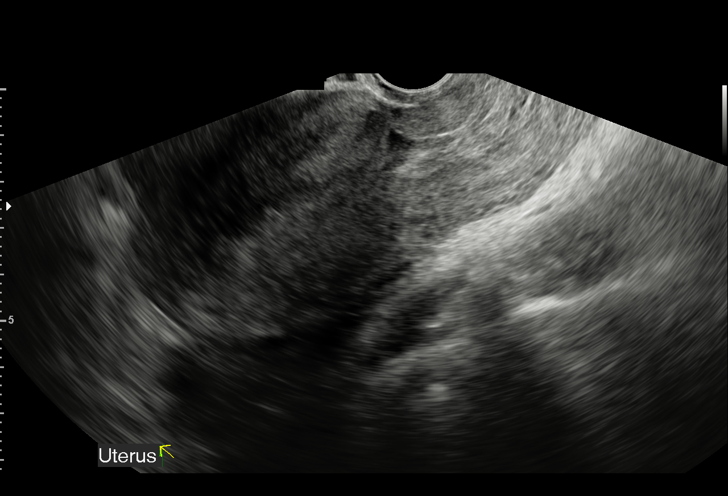

[15 of 28 positions shown; findings below may reference images not displayed]

FINDINGS: Intrauterine gestational sac: None

Yolk sac:  No

Embryo:  No

Subchorionic hemorrhage:  None visualized.

Maternal uterus/adnexae:

Right ovary: Normal

Left ovary: Normal

Other :None

Free fluid:  None
IMPRESSION: Findings meet definitive criteria for failed pregnancy. This follows
SRU consensus guidelines: Diagnostic Criteria for Nonviable
Pregnancy Early in the First Trimester. N Engl J Med
8463;[DATE].

Previously noted gestational sac within the endocervical region is
no longer visualized.

## 2020-07-03 NOTE — Progress Notes (Signed)
Office Visit Note  Patient: Kathleen Nguyen             Date of Birth: [birth year]           MRN: [id number]             PCP: Clent Demark, PA-C Referring: Clent Demark, PA-C Visit Date: 07/17/2020 Occupation: @GUAROCC @  Interpreter: Rulon Sera  Subjective:  Medication monitoring   History of Present Illness: Kathleen Nguyen is a 35 y.o. female with history of ankylosing spondylitis.  She is prescribed cimzia 200 mg sq injections every 14 days.  According to the patient she missed oine dose of cimzia recently due to requiring a refill.  She would like a sample of cimzia today while she is in the office.  She denies any increased joint pain or joint swelling recently.  She denies any neck or back pain currently.  She has not had any morning stiffness or nocturnal pain.  She denies any difficulty with ADLs.  She denies any plantar fasciitis or achilles tendonitis.  She states she continues to experience eye dryness but denies any eye pain or photophobia.  She has been seeing her ophthalmologist every 6 months and use eye drops as needed for symptomatic relief.       Activities of Daily Living:  Patient reports morning stiffness for 15 minutes.   Patient Reports nocturnal pain.  Difficulty dressing/grooming: Denies Difficulty climbing stairs: Denies Difficulty getting out of chair: Denies Difficulty using hands for taps, buttons, cutlery, and/or writing: Denies  Review of Systems  Constitutional: Positive for fatigue.  HENT: Positive for mouth dryness. Negative for mouth sores and nose dryness.   Eyes: Positive for dryness. Negative for pain and itching.  Respiratory: Negative for shortness of breath and difficulty breathing.   Cardiovascular: Negative for chest pain and palpitations.  Gastrointestinal: Positive for constipation. Negative for blood in stool and diarrhea.  Endocrine: Negative for increased urination.  Genitourinary: Negative for difficulty urinating.   Musculoskeletal: Positive for myalgias, morning stiffness and myalgias. Negative for arthralgias, joint pain, joint swelling and muscle tenderness.  Skin: Negative for color change, rash and redness.  Allergic/Immunologic: Negative for susceptible to infections.  Neurological: Positive for numbness. Negative for dizziness, headaches, memory loss and weakness.  Hematological: Negative for bruising/bleeding tendency.  Psychiatric/Behavioral: Negative for confusion.    PMFS History:  Patient Active Problem List   Diagnosis Date Noted  . 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 Anemia in pregnancy 12/07/2018  . Language barrier 12/04/2018  . History of cesarean delivery 12/04/2018  . Ankylosing spondylitis ([place]) 11/11/2018  . Uterine adenomyoma 12/11/2016    Past Medical History:  Diagnosis Date  . Anemia   . Ankylosing spondylitis ([place])   . Ankylosing spondylitis ([place])   . Rheumatoid arthritis ([place])     Family History  Problem Relation Age of Onset  . Arthritis/Rheumatoid Mother    Past Surgical History:  Procedure Laterality Date  . CESAREAN SECTION    . CESAREAN SECTION    . CESAREAN SECTION N/A 05/03/2019   Procedure: CESAREAN SECTION;  Surgeon: Woodroe Mode, MD;  Location: [facility]S;  Service: Obstetrics;  Laterality: N/A;   Social History   Social History Narrative  . Not on file   Immunization History  Administered Date(s) Administered  . Influenza,inj,Quad PF,6+ Mos 02/22/2019  . Janssen (J&J) SARS-COV-2 Vaccination 09/18/2019  . PFIZER(Purple Top)SARS-COV-2 Vaccination 05/08/2020  . Tdap 12/18/2017, 02/22/2019     Objective: Vital Signs: BP  104/73 (BP Location: Left Arm, Patient Position: Sitting, Cuff Size: Normal)   Pulse 88   Resp 15   Ht 5\' 5"  (1.651 m)   Wt 188 lb (85.3 kg)   BMI 31.28 kg/m    Physical Exam Vitals and nursing note reviewed.  Constitutional:      Appearance: She is well-developed and well-nourished.  HENT:     Head:  Normocephalic and atraumatic.  Eyes:     Extraocular Movements: EOM normal.     Conjunctiva/sclera: Conjunctivae normal.  Cardiovascular:     Pulses: Intact distal pulses.  Pulmonary:     Effort: Pulmonary effort is normal.  Abdominal:     Palpations: Abdomen is soft.  Musculoskeletal:     Cervical back: Normal range of motion.  Skin:    General: Skin is warm and dry.     Capillary Refill: Capillary refill takes less than 2 seconds.  Neurological:     Mental Status: She is alert and oriented to person, place, and time.  Psychiatric:        Mood and Affect: Mood and affect normal.        Behavior: Behavior normal.      Musculoskeletal Exam: C-spine, thoracic spine, and lumbar spine good ROM.  No midline spinal tenderness.  No SI joint tenderness.  Shoulder joints, with joints, wrist joints, MCPs, PIPs, DIPs have good range of motion with no synovitis.  She is able to make a complete fist bilaterally.  Hip joints have good range of motion with no discomfort.  No tenderness over trochanteric bursa bilaterally.  Knee joints have good range of motion with no warmth or effusion.  Ankle joints have good range of motion with no tenderness or inflammation.  No Achilles denies or plantar fasciitis.  No tenderness of MTP joints.  CDAI Exam: CDAI Score: -- Patient Global: --; Provider Global: -- Swollen: --; Tender: -- Joint Exam 07/17/2020   No joint exam has been documented for this visit   There is currently no information documented on the homunculus. Go to the Rheumatology activity and complete the homunculus joint exam.  Investigation: No additional findings.  Imaging: No results found.  Recent Labs: Lab Results  Component Value Date   WBC 4.7 04/17/2020   HGB 13.0 04/17/2020   PLT 325 04/17/2020   NA 137 04/17/2020   K 4.1 04/17/2020   CL 102 04/17/2020   CO2 28 04/17/2020   GLUCOSE 87 04/17/2020   BUN 11 04/17/2020   CREATININE 0.42 (L) 04/17/2020   BILITOT 0.6  04/17/2020   ALKPHOS 35 (L) 02/22/2019   AST 15 04/17/2020   ALT 15 04/17/2020   PROT 7.7 04/17/2020   ALBUMIN 3.5 (L) 02/22/2019   CALCIUM 9.3 04/17/2020   GFRAA 155 04/17/2020   QFTBGOLDPLUS NEGATIVE 04/17/2020    Speciality Comments: No specialty comments available.  Procedures:  No procedures performed Allergies: Patient has no known allergies.   Assessment / Plan:     Visit Diagnoses: Ankylosing spondylitis of lumbosacral region [facility]): She has not had any signs or symptoms of a flare recently.  C-spine, thoracic spine, and lumbar spine have good range of motion with no discomfort.  No midline spinal tenderness or SI joint tenderness noted.  She is not experiencing any morning stiffness or nocturnal pain.  She has not had any difficulty with ADLs.  She has no Achilles tendinitis or plantar fasciitis.  She has persistent eye dryness but has not had any eye pain, photophobia, or conjunctival  injection.  She is clinically doing well on cimzia 200 mg sq injections once every 14 days.  She recently missed a dose of cimzia due to requiring a refill/copay card.  A refill was sent to the pharmacy on 07/04/20. She will continue on Cimzia 200 mg subcutaneous injections every 14 days as prescribed.  A sample of Cimzia was provided to the patient today in the office.  Co-pay card was provided to the patient today while she was in the office.  She was advised to notify us if she develops any increased joint pain or joint swelling.  She will follow-up in the office in 3 months.  High risk medication use - Cimzia 200 mg sq injections every 14 days.  CBC and CMP were updated on 04/17/2020.  She is due to update lab work today.  Orders for CBC and CMP were released.  TB gold negative on 04/17/2020 and will continue to be monitored yearly.- Plan: CBC with Differential/Platelet, COMPLETE METABOLIC PANEL WITH GFR She has not had any recent infections.  We discussed the importance of holding Cimzia if she  develops signs or symptoms of an infection and to resume once infection has completely cleared. A sample of Cimzia was provided to the patient today in the office.  Sacroiliitis ([place]):She has no SI joint tenderness on exam.   Dry eyes: She has persistent eye dryness.  She has not had any eye pain, erythema, or photophobia recently.  She continues to follow-up with her ophthalmologist every 6 months.  She has been using eyedrops as needed for symptomatic relief.  No conjunctival injection was noted on examination today.  Other medical conditions are listed as follows  Uterine adenomyoma  Tinea pedis of left foot - Resolved  Language barrier: Interpreter: Hanan Elkonti  Orders: Orders Placed This Encounter  Procedures  . CBC with Differential/Platelet  . COMPLETE METABOLIC PANEL WITH GFR   No orders of the defined types were placed in this encounter.    Follow-Up Instructions: Return in about 3 months (around 10/15/2020) for Ankylosing Spondylitis.   Ofilia Neas, PA-C  Note - This record has been created using Dragon software.  Chart creation errors have been sought, but may not always  have been located. Such creation errors do not reflect on  the standard of medical care.

## 2020-07-04 ENCOUNTER — Other Ambulatory Visit: Payer: Self-pay | Admitting: Pharmacist

## 2020-07-04 DIAGNOSIS — M457 Ankylosing spondylitis of lumbosacral region: Secondary | ICD-10-CM

## 2020-07-04 DIAGNOSIS — Z79899 Other long term (current) drug therapy: Secondary | ICD-10-CM

## 2020-07-04 MED ORDER — CIMZIA PREFILLED 2 X 200 MG/ML ~~LOC~~ KIT: PACK | SUBCUTANEOUS | 0 refills | Status: DC

## 2020-07-04 NOTE — Telephone Encounter (Signed)
Called patient using interpreter, [phone].  Due for CBC/CMP 07/18/20  Received notification that patient must fill Cimzia pre-filled syringes through  [facility] Sent new prescription to [facility] for 1 month supply since she's due for labs in 2 weeks. Patient notified that she is due for labs in 2 weeks. She verbalized understanding and will stop by for labs.  Phone: [phone]  Knox Saliva, PharmD, MPH Clinical Pharmacist (Rheumatology and Pulmonology)

## 2020-07-17 ENCOUNTER — Other Ambulatory Visit: Payer: Self-pay

## 2020-07-17 ENCOUNTER — Encounter: Payer: Self-pay | Admitting: Physician Assistant

## 2020-07-17 ENCOUNTER — Ambulatory Visit (INDEPENDENT_AMBULATORY_CARE_PROVIDER_SITE_OTHER): Payer: 59 | Admitting: Physician Assistant

## 2020-07-17 VITALS — BP 104/73 | HR 88 | Resp 15 | Ht 65.0 in | Wt 188.0 lb

## 2020-07-17 DIAGNOSIS — M461 Sacroiliitis, not elsewhere classified: Secondary | ICD-10-CM

## 2020-07-17 DIAGNOSIS — Z789 Other specified health status: Secondary | ICD-10-CM

## 2020-07-17 DIAGNOSIS — D269 Other benign neoplasm of uterus, unspecified: Secondary | ICD-10-CM

## 2020-07-17 DIAGNOSIS — B353 Tinea pedis: Secondary | ICD-10-CM

## 2020-07-17 DIAGNOSIS — H04123 Dry eye syndrome of bilateral lacrimal glands: Secondary | ICD-10-CM

## 2020-07-17 DIAGNOSIS — M457 Ankylosing spondylitis of lumbosacral region: Secondary | ICD-10-CM

## 2020-07-17 DIAGNOSIS — Z79899 Other long term (current) drug therapy: Secondary | ICD-10-CM

## 2020-07-17 NOTE — Patient Instructions (Signed)
Standing Labs We placed an order today for your standing lab work.   Please have your standing labs drawn in April and every 3 months   If possible, please have your labs drawn 2 weeks prior to your appointment so that the provider can discuss your results at your appointment.  We have open lab daily Monday through Thursday from 8:30-12:30 PM and 1:30-4:30 PM and Friday from 8:30-12:30 PM and 1:30-4:00 PM at the office of Dr. Shaili Deveshwar, [facility].   Please be advised, patients with office appointments requiring lab work will take precedents over walk-in lab work.  If possible, please come for your lab work on Monday and Friday afternoons, as you may experience shorter wait times. The office is located at [address], [address], [place], [place] [address] No appointment is necessary.   Labs are drawn by [facility]. Please bring your co-pay at the time of your lab draw.  You may receive a bill from [facility] for your lab work.  If you wish to have your labs drawn at another location, please call the office 24 hours in advance to send orders.  If you have any questions regarding directions or hours of operation,  please call [phone].   As a reminder, please drink plenty of water prior to coming for your lab work. Thanks!   

## 2020-07-17 NOTE — Progress Notes (Signed)
Cimplicity copay/debit card information: [phone] [id number]  Expires: 12/22 ID:  [id number] CVC:  [id number]  Provided [place] with copay card information and Medicaid information to run as secondary.  Advised patient to call [place] [phone]) by tomorrow morning. Also advised that mornings or late evenings are the best time to call pharmacy to schedule shipment. We discussed that pharmacies are understaffed and it is recommended that she initiate shipment rather than waiting on pharmacy's call.  Advised her to call our clinic if she has issues with scheduling delivery.   Patient provided Cimzia sample today.  Drug name: Cimzia 200mg /ml PFS x 2 Qty: 1 box (2 PFS) LOT: [id number] Exp.Date: 04/22  Dosing instructions: Inject PFS under the skin every 14 days. Last dose was 06/15/20.  The patient has been instructed regarding the correct time, dose, and frequency of taking this medication, including desired effects and most common side effects.   Gwendalyn Mcgonagle S Corleen Otwell 11:13 AM 07/17/2020

## 2020-07-18 LAB — COMPLETE METABOLIC PANEL WITH GFR
AG Ratio: 1.5 (calc) (ref 1.0–2.5)
ALT: 11 U/L (ref 6–29)
AST: 13 U/L (ref 10–30)
Albumin: 4.7 g/dL (ref 3.6–5.1)
Alkaline phosphatase (APISO): 61 U/L (ref 31–125)
BUN: 18 mg/dL (ref 7–25)
CO2: 28 mmol/L (ref 20–32)
Calcium: 9.9 mg/dL (ref 8.6–10.2)
Chloride: 102 mmol/L (ref 98–110)
Creat: 0.52 mg/dL (ref 0.50–1.10)
GFR, Est African American: 144 mL/min/{1.73_m2} (ref >=60)
GFR, Est Non African American: 125 mL/min/{1.73_m2} (ref >=60)
Globulin: 3.1 g/dL (calc) (ref 1.9–3.7)
Glucose, Bld: 82 mg/dL (ref 65–99)
Potassium: 4.4 mmol/L (ref 3.5–5.3)
Sodium: 139 mmol/L (ref 135–146)
Total Bilirubin: 0.3 mg/dL (ref 0.2–1.2)
Total Protein: 7.8 g/dL (ref 6.1–8.1)

## 2020-07-18 LAB — CBC WITH DIFFERENTIAL/PLATELET
Absolute Monocytes: 380 cells/uL (ref 200–950)
Basophils Absolute: 30 cells/uL (ref 0–200)
Basophils Relative: 0.6 %
Eosinophils Absolute: 80 cells/uL (ref 15–500)
Eosinophils Relative: 1.6 %
HCT: 40.9 % (ref 35.0–45.0)
Hemoglobin: 13.1 g/dL (ref 11.7–15.5)
Lymphs Abs: 1920 cells/uL (ref 850–3900)
MCH: 28.2 pg (ref 27.0–33.0)
MCHC: 32 g/dL (ref 32.0–36.0)
MCV: 88.1 fL (ref 80.0–100.0)
MPV: 9.8 fL (ref 7.5–12.5)
Monocytes Relative: 7.6 %
Neutro Abs: 2590 cells/uL (ref 1500–7800)
Neutrophils Relative %: 51.8 %
Platelets: 347 10*3/uL (ref 140–400)
RBC: 4.64 10*6/uL (ref 3.80–5.10)
RDW: 12.3 % (ref 11.0–15.0)
Total Lymphocyte: 38.4 %
WBC: 5 10*3/uL (ref 3.8–10.8)

## 2020-07-18 NOTE — Progress Notes (Signed)
CBC and CMP WNL

## 2020-07-27 ENCOUNTER — Telehealth: Payer: Self-pay | Admitting: *Deleted

## 2020-07-27 NOTE — Telephone Encounter (Signed)
Patient contacted the office stating she is having trouble getting her prescription sent to her. Patient mentioned that she does not have anything left on her co-pay card. Patient is also having issues with her insurance note showing up. Can you please look into this and contact the patient. Thanks!

## 2020-07-28 NOTE — Telephone Encounter (Signed)
Adrian regarding patient's Cimzia [phone]).  Per [facility], they are still waiting for insurance team to verify Medicaid coverage of Cimzia to bill as secondary prior to calling Cimplicity to load the card.  Expected turnaround time is up to 72 hours.  Used interpreter (#Adil, [phone]) to call patient to notify. Patient has one syringe at home for her next dose that is due this upcoming week on 08/03/20.  Knox Saliva, PharmD, MPH Clinical Pharmacist (I Rheumatology and Pulmonology)

## 2020-08-01 NOTE — Telephone Encounter (Addendum)
Kathleen Nguyen. Patient's primary insurance is locking her into [facility] and unable to bill through her Medicaid. I've requested pharmacy to obtain override, as patient's Cimplicity card must be reloaded with each fill and company technically requires Cimplicity card to pick up any remaining copay after all insurance plans are adjudicated. Per [employer], they cannot get override to bill through Medicaid.  Per Cimplicity card rep, last loaded 01/12/20.  Called patient with interpreter [phone]).  Copay is $995 for 28 days before Cimplicity card.  Pharmacy verified that they've billed through both primary and Cimplicity card that'd been reloaded and patient's copay is $0.  Pharmacy has marked patient to be called to schedule delivery.  Knox Saliva, PharmD, MPH Clinical Pharmacist (Rheumatology and Pulmonology)

## 2020-08-04 NOTE — Telephone Encounter (Signed)
[place] to follow up on Cimzia order status. Had to once again advise rep that patient's primary and Cimplicity card could be billed since Medicaid isn't being billed. Able to adjudicate again for $0. Will mark patient to be scheduled for delivery this morning.Requested call to clinic stating that patient's Cimzia is scheduled so that we do not have to follow up with [facility] again.  Knox Saliva, PharmD, MPH Clinical Pharmacist (Rheumatology and Pulmonology)

## 2020-08-07 ENCOUNTER — Telehealth: Payer: Self-pay | Admitting: Rheumatology

## 2020-08-07 NOTE — Telephone Encounter (Signed)
[employer] calling to left you know patient will be receiving Cimzia tomorrow. [employer]  Call back  # [phone], if needed.

## 2020-08-16 ENCOUNTER — Telehealth: Payer: Self-pay | Admitting: Family Medicine

## 2020-08-16 NOTE — Telephone Encounter (Signed)
Pt states she is having severe vaginal pain and severe  bleeding for 5 days, irregular cycle since delivery Nov 2021. Pt states this cycle is more severe pain than normal and more severe bleeding. States that she had fibroids years ago but not sure what to do.

## 2020-08-17 NOTE — Telephone Encounter (Signed)
Called pt and discussed her concern. She stated that she is not having any pain today and that her bleeding is very much less. Pt would still like an appointment to see a physician due to her irregular bleeding. She would like next available appointment on a Tuesday morning. She was advised that a pelvic exam will probably be indicated. She voiced understanding and agreed to see a female or female physician.

## 2020-08-24 ENCOUNTER — Other Ambulatory Visit: Payer: Self-pay | Admitting: Physician Assistant

## 2020-08-24 DIAGNOSIS — M457 Ankylosing spondylitis of lumbosacral region: Secondary | ICD-10-CM

## 2020-08-24 NOTE — Telephone Encounter (Signed)
Last Visit: 07/17/2020 Next Visit: 10/16/2020 Labs: 07/17/2020 CBC and CMP WNL TB Gold: 04/17/2020 Neg   Current Dose per office note 07/17/2020: Cimzia 200 mg sq injections every 14 days  DX: Ankylosing spondylitis of lumbosacral region   Last Fill: 07/04/2020 (30 day supply)  Okay to refill per Dr. Estanislado Pandy

## 2020-09-05 ENCOUNTER — Encounter: Payer: Self-pay | Admitting: *Deleted

## 2020-09-05 NOTE — Congregational Nurse Program (Signed)
  Dept: [place] Nurse Program Note  Date of Encounter: 09/05/2020  Past Medical History: Past Medical History:  Diagnosis Date  . Anemia   . Ankylosing spondylitis ([place])   . Ankylosing spondylitis ([place])   . Rheumatoid arthritis ([place])     Encounter Details:  CNP Questionnaire - 09/05/20 1540      Questionnaire   Do you give verbal consent to treat you today? Yes    Visit Setting Church or Organization    Location Patient Served At [facility]    Patient Status Immigrant    Medical Provider Yes    [employer]    Intervention Assess (including screenings)    ED Visit Averted Yes          Client came into [facility] center requesting BP and weight check.  Weight was 188 lbs.  Client would like to lose about 40 to 45 lbs.  Went over healthy eating habits and getting 30 minutes of exercise 5 times a week.  Client to follow up with this CN as needed.  Karene Fry, RN, MSN, [place] Office [phone] Cell

## 2020-09-19 ENCOUNTER — Encounter: Payer: Self-pay | Admitting: *Deleted

## 2020-09-19 NOTE — Congregational Nurse Program (Signed)
  Dept: [place] Nurse Program Note  Date of Encounter: 09/19/2020  Past Medical History: Past Medical History:  Diagnosis Date  . Anemia   . Ankylosing spondylitis ([place])   . Ankylosing spondylitis ([place])   . Rheumatoid arthritis ([place])     Encounter Details:  CNP Questionnaire - 09/19/20 1408      Questionnaire   Do you give verbal consent to treat you today? Yes    Visit Setting Church or Organization    Location Patient Served At [employer] Provider Yes    [employer]    Intervention Assess (including screenings)    ED Visit Averted Yes          Client came into [facility] for BP check.  She is currently breast feeding a 15 mo infant.  She feels very tired.  She if following healthy eating guidelines per her report to me.  Encouraged to drink plenty of water, keep salt intake down and try to walk 30 minutes daily.  She will follow up as needed.  Karene Fry, RN, MSN, [place] Office [phone] Cell

## 2020-09-26 ENCOUNTER — Encounter: Payer: Self-pay | Admitting: *Deleted

## 2020-09-27 NOTE — Congregational Nurse Program (Signed)
  Dept: [facility] Nurse Program Note  Date of Encounter: 09/26/2020  Past Medical History: Past Medical History:  Diagnosis Date  . Anemia   . Ankylosing spondylitis ([place])   . Ankylosing spondylitis ([place])   . Rheumatoid arthritis ([place])     Encounter Details:  CNP Questionnaire - 09/26/20 1330      Questionnaire   Do you give verbal consent to treat you today? Yes    Visit Setting Church or Organization    Location Patient Served At [place]    Patient Status Immigrant    Medical Provider Yes    Insurance Private Insurance    Intervention Assess (including screenings);Educate    ED Visit Averted Yes          Client came into [facility] for BP check.  Client says that she has back pain.  Discussed walking and back exercises.  Client says she does have back exercises but has not been doing them.  Talked about making a schedule so that she can exercise and/or do back exercises for at least 10 minutes at a time.  Encouraged client and reinforced healthy eating and getting exercise for continued good health.  Client to follow up with this CN as needed.  Karene Fry, RN, MSN, [place] Office [phone] Cell

## 2020-10-02 NOTE — Progress Notes (Deleted)
Office Visit Note  Patient: Kathleen Nguyen             Date of Birth: 12/23/1985           MRN: [id number]             PCP: Clent Demark, PA-C Referring: Clent Demark, PA-C Visit Date: 10/16/2020 Occupation: @GUAROCC @  Subjective:    History of Present Illness: Kathleen Nguyen is a 35 y.o. female with history of ankylosing spondylitis.  She is on cimzia 200 mg sq injections every 2 weeks.   CBC and CMP WNL on 07/17/20.  She is due to update lab work.  Orders for CBC and CMP were released.  Her next lab work will be due in July and every 3 months.  Standing orders for CBC and CMP are in place.  TB gold negative on 04/17/2020 and will continue to be monitored yearly.    Activities of Daily Living:  Patient reports morning stiffness for *** {minute/hour:19697}.   Patient {ACTIONS;DENIES/REPORTS:21021675::"Denies"} nocturnal pain.  Difficulty dressing/grooming: {ACTIONS;DENIES/REPORTS:21021675::"Denies"} Difficulty climbing stairs: {ACTIONS;DENIES/REPORTS:21021675::"Denies"} Difficulty getting out of chair: {ACTIONS;DENIES/REPORTS:21021675::"Denies"} Difficulty using hands for taps, buttons, cutlery, and/or writing: {ACTIONS;DENIES/REPORTS:21021675::"Denies"}  No Rheumatology ROS completed.   PMFS History:  Patient Active Problem List   Diagnosis Date Noted  . 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 Anemia in pregnancy 12/07/2018  . Language barrier 12/04/2018  . History of cesarean delivery 12/04/2018  . Ankylosing spondylitis ([place]) 11/11/2018  . Uterine adenomyoma 12/11/2016    Past Medical History:  Diagnosis Date  . Anemia   . Ankylosing spondylitis ([place])   . Ankylosing spondylitis ([place])   . Rheumatoid arthritis ([place])     Family History  Problem Relation Age of Onset  . Arthritis/Rheumatoid Mother    Past Surgical History:  Procedure Laterality Date  . CESAREAN SECTION    . CESAREAN SECTION    . CESAREAN SECTION N/A 05/03/2019   Procedure: CESAREAN  SECTION;  Surgeon: Woodroe Mode, MD;  Location[facility];  Service: Obstetrics;  Laterality: N/A;   Social History   Social History Narrative  . Not on file   Immunization History  Administered Date(s) Administered  . Influenza,inj,Quad PF,6+ Mos 02/22/2019  . Janssen (J&J) SARS-COV-2 Vaccination 09/18/2019  . PFIZER(Purple Top)SARS-COV-2 Vaccination 05/08/2020  . Tdap 12/18/2017, 02/22/2019     Objective: Vital Signs: There were no vitals taken for this visit.   Physical Exam Vitals and nursing note reviewed.  Constitutional:      Appearance: She is well-developed.  HENT:     Head: Normocephalic and atraumatic.  Eyes:     Conjunctiva/sclera: Conjunctivae normal.  Pulmonary:     Effort: Pulmonary effort is normal.  Abdominal:     Palpations: Abdomen is soft.  Musculoskeletal:     Cervical back: Normal range of motion.  Skin:    General: Skin is warm and dry.     Capillary Refill: Capillary refill takes less than 2 seconds.  Neurological:     Mental Status: She is alert and oriented to person, place, and time.  Psychiatric:        Behavior: Behavior normal.      Musculoskeletal Exam: ***  CDAI Exam: CDAI Score: -- Patient Global: --; Provider Global: -- Swollen: --; Tender: -- Joint Exam 10/16/2020   No joint exam has been documented for this visit   There is currently no information documented on the homunculus. Go to the Rheumatology activity and complete the homunculus joint exam.  Investigation: No additional findings.  Imaging: No results found.  Recent Labs: Lab Results  Component Value Date   WBC 5.0 07/17/2020   HGB 13.1 07/17/2020   PLT 347 07/17/2020   NA 139 07/17/2020   K 4.4 07/17/2020   CL 102 07/17/2020   CO2 28 07/17/2020   GLUCOSE 82 07/17/2020   BUN 18 07/17/2020   CREATININE 0.52 07/17/2020   BILITOT 0.3 07/17/2020   ALKPHOS 35 (L) 02/22/2019   AST 13 07/17/2020   ALT 11 07/17/2020   PROT 7.8 07/17/2020   ALBUMIN 3.5  (L) 02/22/2019   CALCIUM 9.9 07/17/2020   GFRAA 144 07/17/2020   QFTBGOLDPLUS NEGATIVE 04/17/2020    Speciality Comments: No specialty comments available.  Procedures:  No procedures performed Allergies: Patient has no known allergies.   Assessment / Plan:     Visit Diagnoses: No diagnosis found.  Orders: No orders of the defined types were placed in this encounter.  No orders of the defined types were placed in this encounter.   Face-to-face time spent with patient was *** minutes. Greater than 50% of time was spent in counseling and coordination of care.  Follow-Up Instructions: No follow-ups on file.   Earnestine Mealing, CMA  Note - This record has been created using Editor, commissioning.  Chart creation errors have been sought, but may not always  have been located. Such creation errors do not reflect on  the standard of medical care.

## 2020-10-10 ENCOUNTER — Encounter: Payer: Self-pay | Admitting: *Deleted

## 2020-10-10 ENCOUNTER — Ambulatory Visit: Payer: 59 | Admitting: Obstetrics & Gynecology

## 2020-10-10 NOTE — [facility]regational Nurse Program (Signed)
  Dept: [facility] Nurse Program Note  Date of Encounter: 10/10/2020  Past Medical History: Past Medical History:  Diagnosis Date  . Anemia   . Ankylosing spondylitis ([place])   . Ankylosing spondylitis ([place])   . Rheumatoid arthritis ([place])     Encounter Details:  CNP Questionnaire - 10/10/20 1245      Questionnaire   Do you give verbal consent to treat you today? Yes    Visit Setting Church or Organization    Location Patient Served At [facility]    Patient Status Immigrant    Medical Provider Yes    Insurance Private Insurance    Intervention Assess (including screenings);Educate    ED Visit Averted Yes          Client came into clinic for BP check.  Client is doing well and is WNL with BP.  She shared that she walked 10 minutes.  Client tells me that she is in school at [employer].  Client will follow up with this CN as desired.  Karene Fry, RN, MSN, [place] Office [phone] Cell

## 2020-10-16 ENCOUNTER — Ambulatory Visit: Payer: 59 | Admitting: Physician Assistant

## 2020-10-16 DIAGNOSIS — M457 Ankylosing spondylitis of lumbosacral region: Secondary | ICD-10-CM

## 2020-10-16 DIAGNOSIS — M461 Sacroiliitis, not elsewhere classified: Secondary | ICD-10-CM

## 2020-10-16 DIAGNOSIS — D269 Other benign neoplasm of uterus, unspecified: Secondary | ICD-10-CM

## 2020-10-16 DIAGNOSIS — B353 Tinea pedis: Secondary | ICD-10-CM

## 2020-10-16 DIAGNOSIS — Z79899 Other long term (current) drug therapy: Secondary | ICD-10-CM

## 2020-10-16 DIAGNOSIS — H04123 Dry eye syndrome of bilateral lacrimal glands: Secondary | ICD-10-CM

## 2020-10-16 DIAGNOSIS — Z789 Other specified health status: Secondary | ICD-10-CM

## 2020-10-18 NOTE — Progress Notes (Signed)
Office Visit Note  Patient: Kathleen Nguyen             Date of Birth: 1985/12/28           MRN: [id number]             PCP: Clent Demark, PA-C Referring: Clent Demark, PA-C Visit Date: 10/25/2020 Occupation: @GUAROCC @  Interpreter: Timmothy Sours   Subjective:  Joint stiffness   History of Present Illness: Kathleen Nguyen is a 35 y.o. female with history of ankylosing spondylitis..  She is prescribed cimzia 200 mg sq injections every 2 weeks.  She states she missed her last Cimzia injection and is due for her next injection tomorrow.  She requested a sample today and the phone number to refill her prescription.  Patient reports that she experiences increased joint stiffness when having interruptions in her Cimzia dosing.  She states that about 6 months ago she injured her left knee and has occasional pain and swelling.  She has not been taking any over-the-counter products for pain relief.  She denies any SI joint pain at this time.  She denies any Achilles tendinitis or plantar fasciitis.  She denies any nocturnal pain.  She reports that she has been experiencing pain redness and dryness in the left eye.  She uses steroid eyedrops as needed.  She has an upcoming appointment with her ophthalmologist.  She is unsure of which ophthalmology practice she goes to or what her diagnosis is.     Activities of Daily Living:  Patient reports morning stiffness for 10-15  minutes.   Patient Denies nocturnal pain.  Difficulty dressing/grooming: Denies Difficulty climbing stairs: Denies Difficulty getting out of chair: Denies Difficulty using hands for taps, buttons, cutlery, and/or writing: Denies  Review of Systems  Constitutional: Positive for fatigue.  HENT: Positive for mouth dryness and nose dryness. Negative for mouth sores.   Eyes: Positive for photophobia, pain, redness and dryness.  Respiratory: Negative for shortness of breath and difficulty breathing.   Cardiovascular:  Negative for chest pain and palpitations.  Gastrointestinal: Positive for constipation. Negative for blood in stool and diarrhea.  Endocrine: Negative for increased urination.  Genitourinary: Negative for difficulty urinating.  Musculoskeletal: Positive for morning stiffness. Negative for arthralgias, joint pain, joint swelling, myalgias, muscle tenderness and myalgias.  Skin: Positive for rash. Negative for color change and redness.  Allergic/Immunologic: Negative for susceptible to infections.  Neurological: Positive for headaches. Negative for dizziness, numbness, memory loss and weakness.  Hematological: Negative for bruising/bleeding tendency.  Psychiatric/Behavioral: Negative for confusion.    PMFS History:  Patient Active Problem List   Diagnosis Date Noted  . 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 Anemia in pregnancy 12/07/2018  . Language barrier 12/04/2018  . History of cesarean delivery 12/04/2018  . Ankylosing spondylitis ([place]) 11/11/2018  . Uterine adenomyoma 12/11/2016    Past Medical History:  Diagnosis Date  . Anemia   . Ankylosing spondylitis ([place])   . Ankylosing spondylitis ([place])   . Rheumatoid arthritis ([place])     Family History  Problem Relation Age of Onset  . Arthritis/Rheumatoid Mother    Past Surgical History:  Procedure Laterality Date  . CESAREAN SECTION    . CESAREAN SECTION    . CESAREAN SECTION N/A 05/03/2019   Procedure: CESAREAN SECTION;  Surgeon: Woodroe Mode, MD;  Location: [facility] LD ORS;  Service: Obstetrics;  Laterality: N/A;   Social History   Social History Narrative  . Not on file   Immunization  History  Administered Date(s) Administered  . Influenza,inj,Quad PF,6+ Mos 02/22/2019  . Janssen (J&J) SARS-COV-2 Vaccination 09/18/2019  . PFIZER(Purple Top)SARS-COV-2 Vaccination 05/08/2020  . Tdap 12/18/2017, 02/22/2019     Objective: Vital Signs: BP 99/71 (BP Location: Left Arm, Patient Position: Sitting, Cuff Size: Normal)   Pulse  89   Ht 5\' 5"  (1.651 m)   Wt 183 lb (83 kg)   BMI 30.45 kg/m    Physical Exam Vitals and nursing note reviewed.  Constitutional:      Appearance: She is well-developed.  HENT:     Head: Normocephalic and atraumatic.  Eyes:     Conjunctiva/sclera: Conjunctivae normal.  Pulmonary:     Effort: Pulmonary effort is normal.  Abdominal:     Palpations: Abdomen is soft.  Musculoskeletal:     Cervical back: Normal range of motion.  Skin:    General: Skin is warm and dry.     Capillary Refill: Capillary refill takes less than 2 seconds.  Neurological:     Mental Status: She is alert and oriented to person, place, and time.  Psychiatric:        Behavior: Behavior normal.      Musculoskeletal Exam: C-spine, thoracic spine, and lumbar spine good ROM.  No midline spinal tenderness or SI joint tenderness.  Shoulder joints, elbow joints, wrist joints, MCPs, PIPs, and DIPs good ROM with no synovitis.  Complete fist formation bilaterally.  Hip joints good ROM with no discomfort.  Left knee has good ROM with joint swelling.  Right knee has good ROM with no warmth or effusion.  Ankle joints good ROM with no tenderness or joint swelling.  No tenderness over MTP joints.   CDAI Exam: CDAI Score: -- Patient Global: --; Provider Global: -- Swollen: --; Tender: -- Joint Exam 10/25/2020   No joint exam has been documented for this visit   There is currently no information documented on the homunculus. Go to the Rheumatology activity and complete the homunculus joint exam.  Investigation: No additional findings.  Imaging: No results found.  Recent Labs: Lab Results  Component Value Date   WBC 5.0 07/17/2020   HGB 13.1 07/17/2020   PLT 347 07/17/2020   NA 139 07/17/2020   K 4.4 07/17/2020   CL 102 07/17/2020   CO2 28 07/17/2020   GLUCOSE 82 07/17/2020   BUN 18 07/17/2020   CREATININE 0.52 07/17/2020   BILITOT 0.3 07/17/2020   ALKPHOS 35 (L) 02/22/2019   AST 13 07/17/2020   ALT 11  07/17/2020   PROT 7.8 07/17/2020   ALBUMIN 3.5 (L) 02/22/2019   CALCIUM 9.9 07/17/2020   GFRAA 144 07/17/2020   QFTBGOLDPLUS NEGATIVE 04/17/2020    Speciality Comments: No specialty comments available.  Procedures:  No procedures performed Allergies: Patient has no known allergies.    Assessment / Plan:     Visit Diagnoses: Ankylosing spondylitis of lumbosacral region [facility]): She has not had any signs or symptoms of a flare recently.  She has prescribed Cimzia 200 mg subcutaneous injections every 2 weeks.  According to the patient she missed her last Cimzia injection and is due for the next injection tomorrow but does not have a refill.  She lost the phone number to contact the [facility] for delivery of her refills.  We discussed the importance of staying compliant with Cimzia injections.  She has had several gaps in therapy due to not setting up prescription deliveries.  She experiences increased joint stiffness if she misses doses  of Cimzia.  She is not having any increased joint pain at this time.  She has no SI joint tenderness or midline spinal tenderness currently.  No synovitis or dactylitis was noted.  She has no evidence of Achilles tendinitis or plantar fasciitis.  She does have some warmth and swelling in the left knee joint and was advised to ice, elevate, and use compression.  According to the patient she has had intermittent left eye pain, redness, and dryness and uses prednisolone acetate eyedrops as needed.  She has an upcoming appointment with her ophthalmologist and was advised to have all records sent to our office.  She is unsure of her her diagnosis, the name of her ophthalmologist, or the name of the ophthalmology practice.   We will check a sed rate today. Sample of Cimzia was provided today in the office along with the phone number from [facility] which she was advised to contact to schedule delivery.   She will continue on the current treatment  regimen.  She was advised to notify us if she develops signs or symptoms of a flare.  She will follow-up in the office in 3 months.- Plan: Sedimentation rate  High risk medication use - Cimzia 200 mg sq injections every 2 weeks.  Sample provided today. She was also given the phone number to call to schedule the delivery of her next refill.  CBC and CMP updated on 07/17/20. She is due to update CBC and CMP today.  Her next lab work will be due in August and every 3 months. TB gold negative on 04/17/20 and will continue to be monitored yearly. - Plan: CBC with Differential/Platelet, COMPLETE METABOLIC PANEL WITH GFR Advised to hold cimzia if she develops signs or symptoms of an infection and to resume once the infection has cleared.  Sacroiliitis [facility]): She is not experiencing any SI discomfort or stiffness at this time.  She no tenderness palpation on examination today.  She has not had any nocturnal pain.  Left eye pain: She reports intermittent left eye pain, redness, and dryness.  She has been using prednisolone acetate drops as needed.  She has upcoming appointment with her ophthalmologist.  She is unsure of the physician's name, practice name, or her diagnosis.  She was advised to call us back with the name of the practice so we can obtain records.   Effusion, left knee: She has warmth and small effusion of the left knee on exam today.  According to the patient about 6 months ago she injured her left knee and if she walks or runs for prolonged period of time she notices intermittent swelling.  She has not been taking any over-the-counter products.  We discussed the use of ice, elevation, and compression.  She was advised to resume Cimzia as prescribed. She will notify us if her symptoms persist or worsen.   Uterine adenomyoma: She has an upcoming appointment with her gynecologist next week.  She has been experiencing irregular periods.  Tinea pedis of left foot: Resolved   Language  barrier  Orders: Orders Placed This Encounter  Procedures  . CBC with Differential/Platelet  . COMPLETE METABOLIC PANEL WITH GFR  . Sedimentation rate   No orders of the defined types were placed in this encounter.     Follow-Up Instructions: Return in about 3 months (around 01/25/2021) for Ankylosing Spondylitis.   Ofilia Neas, PA-C  Note - This record has been created using Dragon software.  Chart creation errors have been  sought, but may not always  have been located. Such creation errors do not reflect on  the standard of medical care.

## 2020-10-25 ENCOUNTER — Encounter: Payer: Self-pay | Admitting: Physician Assistant

## 2020-10-25 ENCOUNTER — Ambulatory Visit (INDEPENDENT_AMBULATORY_CARE_PROVIDER_SITE_OTHER): Payer: 59 | Admitting: Physician Assistant

## 2020-10-25 ENCOUNTER — Other Ambulatory Visit: Payer: Self-pay

## 2020-10-25 VITALS — BP 99/71 | HR 89 | Ht 65.0 in | Wt 183.0 lb

## 2020-10-25 DIAGNOSIS — Z79899 Other long term (current) drug therapy: Secondary | ICD-10-CM | POA: Diagnosis not present

## 2020-10-25 DIAGNOSIS — M461 Sacroiliitis, not elsewhere classified: Secondary | ICD-10-CM

## 2020-10-25 DIAGNOSIS — H5712 Ocular pain, left eye: Secondary | ICD-10-CM | POA: Diagnosis not present

## 2020-10-25 DIAGNOSIS — Z789 Other specified health status: Secondary | ICD-10-CM

## 2020-10-25 DIAGNOSIS — H04123 Dry eye syndrome of bilateral lacrimal glands: Secondary | ICD-10-CM

## 2020-10-25 DIAGNOSIS — M457 Ankylosing spondylitis of lumbosacral region: Secondary | ICD-10-CM | POA: Diagnosis not present

## 2020-10-25 DIAGNOSIS — B353 Tinea pedis: Secondary | ICD-10-CM

## 2020-10-25 DIAGNOSIS — D269 Other benign neoplasm of uterus, unspecified: Secondary | ICD-10-CM

## 2020-10-25 DIAGNOSIS — M25462 Effusion, left knee: Secondary | ICD-10-CM

## 2020-10-25 NOTE — Progress Notes (Signed)
Medication Samples have been provided to the patient.  Drug name: Cimzia   Strength: 200mg /mL        Qty: 1  LOT: [id number]  Exp.Date: 09/2021   Dosing instructions: Inject 200mg  into the skin every 2 weeks.

## 2020-10-25 NOTE — Patient Instructions (Signed)
[place] - Phone:  [phone]

## 2020-10-26 LAB — CBC WITH DIFFERENTIAL/PLATELET
Absolute Monocytes: 629 cells/uL (ref 200–950)
Basophils Absolute: 19 cells/uL (ref 0–200)
Basophils Relative: 0.4 %
Eosinophils Absolute: 82 cells/uL (ref 15–500)
Eosinophils Relative: 1.7 %
HCT: 37.6 % (ref 35.0–45.0)
Hemoglobin: 11.9 g/dL (ref 11.7–15.5)
Lymphs Abs: 2650 cells/uL (ref 850–3900)
MCH: 27.7 pg (ref 27.0–33.0)
MCHC: 31.6 g/dL — ABNORMAL LOW (ref 32.0–36.0)
MCV: 87.6 fL (ref 80.0–100.0)
MPV: 9.6 fL (ref 7.5–12.5)
Monocytes Relative: 13.1 %
Neutro Abs: 1421 cells/uL — ABNORMAL LOW (ref 1500–7800)
Neutrophils Relative %: 29.6 %
Platelets: 326 10*3/uL (ref 140–400)
RBC: 4.29 10*6/uL (ref 3.80–5.10)
RDW: 12.5 % (ref 11.0–15.0)
Total Lymphocyte: 55.2 %
WBC: 4.8 10*3/uL (ref 3.8–10.8)

## 2020-10-26 LAB — COMPLETE METABOLIC PANEL WITH GFR
AG Ratio: 1.5 (calc) (ref 1.0–2.5)
ALT: 10 U/L (ref 6–29)
AST: 10 U/L (ref 10–30)
Albumin: 4.5 g/dL (ref 3.6–5.1)
Alkaline phosphatase (APISO): 56 U/L (ref 31–125)
BUN: 14 mg/dL (ref 7–25)
CO2: 25 mmol/L (ref 20–32)
Calcium: 9.2 mg/dL (ref 8.6–10.2)
Chloride: 103 mmol/L (ref 98–110)
Creat: 0.52 mg/dL (ref 0.50–1.10)
GFR, Est African American: 144 mL/min/{1.73_m2} (ref >=60)
GFR, Est Non African American: 125 mL/min/{1.73_m2} (ref >=60)
Globulin: 3 g/dL (calc) (ref 1.9–3.7)
Glucose, Bld: 91 mg/dL (ref 65–99)
Potassium: 4 mmol/L (ref 3.5–5.3)
Sodium: 138 mmol/L (ref 135–146)
Total Bilirubin: 0.2 mg/dL (ref 0.2–1.2)
Total Protein: 7.5 g/dL (ref 6.1–8.1)

## 2020-10-26 LAB — SEDIMENTATION RATE: Sed Rate: 11 mm/h (ref 0–20)

## 2020-10-26 NOTE — Progress Notes (Signed)
Absolute neutrophils are borderline low.  WBC count is WNL.  We will continue to monitor.  CMP WNL.  ESR WNL.

## 2020-10-30 ENCOUNTER — Ambulatory Visit: Payer: 59 | Admitting: Obstetrics and Gynecology

## 2020-11-17 ENCOUNTER — Ambulatory Visit: Payer: 59 | Admitting: Obstetrics and Gynecology

## 2020-12-01 ENCOUNTER — Ambulatory Visit: Payer: 59 | Admitting: Family Medicine

## 2020-12-09 ENCOUNTER — Encounter (HOSPITAL_COMMUNITY): Payer: Self-pay

## 2020-12-09 ENCOUNTER — Other Ambulatory Visit: Payer: Self-pay

## 2020-12-09 ENCOUNTER — Emergency Department (HOSPITAL_COMMUNITY): Payer: 59

## 2020-12-09 ENCOUNTER — Emergency Department (HOSPITAL_COMMUNITY)
Admission: EM | Admit: 2020-12-09 | Discharge: 2020-12-09 | Disposition: A | Discharge status: Home / Self Care | Payer: 59 | Attending: Emergency Medicine | Admitting: Emergency Medicine

## 2020-12-09 DIAGNOSIS — R1031 Right lower quadrant pain: Secondary | ICD-10-CM

## 2020-12-09 DIAGNOSIS — R102 Pelvic and perineal pain: Secondary | ICD-10-CM | POA: Diagnosis present

## 2020-12-09 DIAGNOSIS — N946 Dysmenorrhea, unspecified: Secondary | ICD-10-CM | POA: Diagnosis not present

## 2020-12-09 LAB — COMPREHENSIVE METABOLIC PANEL
ALT: 14 U/L (ref 0–44)
AST: 15 U/L (ref 15–41)
Albumin: 4.3 g/dL (ref 3.5–5.0)
Alkaline Phosphatase: 44 U/L (ref 38–126)
Anion gap: 9 (ref 5–15)
BUN: 9 mg/dL (ref 6–20)
CO2: 23 mmol/L (ref 22–32)
Calcium: 9.2 mg/dL (ref 8.9–10.3)
Chloride: 104 mmol/L (ref 98–111)
Creatinine, Ser: 0.49 mg/dL (ref 0.44–1.00)
GFR, Estimated: >60 mL/min (ref >60)
Glucose, Bld: 101 mg/dL — ABNORMAL HIGH (ref 70–99)
Potassium: 3.6 mmol/L (ref 3.5–5.1)
Sodium: 136 mmol/L (ref 135–145)
Total Bilirubin: 0.7 mg/dL (ref 0.3–1.2)
Total Protein: 7.9 g/dL (ref 6.5–8.1)

## 2020-12-09 LAB — CBC
HCT: 38.1 % (ref 36.0–46.0)
Hemoglobin: 12.5 g/dL (ref 12.0–15.0)
MCH: 28.5 pg (ref 26.0–34.0)
MCHC: 32.8 g/dL (ref 30.0–36.0)
MCV: 86.8 fL (ref 80.0–100.0)
Platelets: 329 10*3/uL (ref 150–400)
RBC: 4.39 MIL/uL (ref 3.87–5.11)
RDW: 12.9 % (ref 11.5–15.5)
WBC: 8 10*3/uL (ref 4.0–10.5)
nRBC: 0 % (ref 0.0–0.2)

## 2020-12-09 LAB — URINALYSIS, ROUTINE W REFLEX MICROSCOPIC
Bacteria, UA: NONE SEEN
Bilirubin Urine: NEGATIVE
Glucose, UA: NEGATIVE mg/dL
Ketones, ur: 80 mg/dL — AB
Leukocytes,Ua: NEGATIVE
Nitrite: NEGATIVE
Protein, ur: 30 mg/dL — AB
RBC / HPF: >50 RBC/hpf — ABNORMAL HIGH (ref 0–5)
Specific Gravity, Urine: 1.023 (ref 1.005–1.030)
pH: 9 — ABNORMAL HIGH (ref 5.0–8.0)

## 2020-12-09 LAB — I-STAT BETA HCG BLOOD, ED (MC, WL, AP ONLY): I-stat hCG, quantitative: <5 m[IU]/mL (ref <5)

## 2020-12-09 MED ORDER — KETOROLAC TROMETHAMINE 30 MG/ML IJ SOLN
30.0000 mg | Freq: Once | INTRAMUSCULAR | Status: AC
  Administered 2020-12-09: 30 mg via INTRAVENOUS
  Filled 2020-12-09: qty 1

## 2020-12-09 MED ORDER — SODIUM CHLORIDE 0.9 % IV BOLUS
500.0000 mL | Freq: Once | INTRAVENOUS | Status: AC
  Administered 2020-12-09: 500 mL via INTRAVENOUS

## 2020-12-09 MED ORDER — OXYCODONE-ACETAMINOPHEN 5-325 MG PO TABS
1.0000 | ORAL_TABLET | Freq: Once | ORAL | Status: AC
  Administered 2020-12-09: 1 via ORAL
  Filled 2020-12-09: qty 1

## 2020-12-09 MED ORDER — MORPHINE SULFATE (PF) 4 MG/ML IV SOLN
4.0000 mg | Freq: Once | INTRAVENOUS | Status: AC
  Administered 2020-12-09: 4 mg via INTRAVENOUS
  Filled 2020-12-09: qty 1

## 2020-12-09 MED ORDER — POLYETHYLENE GLYCOL 3350 17 GM/SCOOP PO POWD: 1.0000 | Freq: Every day | ORAL | 0 refills | Status: DC | PRN

## 2020-12-09 MED ORDER — IBUPROFEN 600 MG PO TABS: 600.0000 mg | ORAL_TABLET | Freq: Four times a day (QID) | ORAL | 2 refills | Status: DC | PRN

## 2020-12-09 MED ORDER — OXYCODONE-ACETAMINOPHEN 5-325 MG PO TABS: 1.0000 | ORAL_TABLET | Freq: Four times a day (QID) | ORAL | 0 refills | Status: DC | PRN

## 2020-12-09 NOTE — ED Triage Notes (Signed)
Patient complains of pelvic cramping and menstrual bleeding x 2 days. States that she is taking otc meds with no relief.

## 2020-12-09 NOTE — ED Provider Notes (Signed)
[facility] EMERGENCY DEPARTMENT Provider Note   CSN: [id number] Arrival date & time: 12/09/20  1703     History CC:  Abdominal pain  Kathleen Nguyen is a 35 y.o. female who is primarily Arabic speaking (translator used) presenting to ED with cramping lower abdominal pain.  She reports onset about 3 days ago.  She says she is currently on her menstrual period and she tends to have significant cramping of this.  However pain is more tolerable this time.  She describes suprapubic pressure.  She describes she is passing bloody clots from the vagina.  She reports poor appetite and nausea due to pain.  She also reports constipation, cannot clarify when her last bowel movement was.  She does have an OB/GYN and has an appointment with them on Thursday, in 5 days, but felt the pain was too significant and came to the ED. she has not been taking any medications at home for her pain.  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   Anemia in pregnancy 12/07/2018   Language barrier 12/04/2018   History of cesarean delivery 12/04/2018   Ankylosing spondylitis ([place]) 11/11/2018   Uterine adenomyoma 12/11/2016    Past Surgical History:  Procedure Laterality Date   CESAREAN SECTION     CESAREAN SECTION     CESAREAN SECTION N/A 05/03/2019   Procedure: CESAREAN SECTION;  Surgeon: Woodroe Mode, MD;  Location: [facility] LD ORS;  Service: Obstetrics;  Laterality: N/A;     OB History     Gravida  4   Para  3   Term  3   Preterm      AB  1   Living  3      SAB  1   IAB      Ectopic      Multiple  0   Live Births  3           Family History  Problem Relation Age of Onset   Arthritis/Rheumatoid Mother     Social History   Tobacco Use   Smoking status: Never    Smokeless tobacco: Never  Vaping Use   Vaping Use: Never used  Substance Use Topics   Alcohol use: No   Drug use: No    Home Medications Prior to Admission medications   Medication Sig Start Date End Date Taking? Authorizing Provider  acetaminophen (TYLENOL) 500 MG tablet Take 500 mg by mouth every 4 (four) hours as needed for mild pain.   Yes [provider]  Carboxymethylcellulose Sodium (DRY EYE RELIEF OP) Place 1 drop into both eyes daily as needed (dry eyes).   Yes [provider]  ibuprofen (ADVIL) 600 MG tablet Take 1 tablet (600 mg total) by mouth every 6 (six) hours as needed for up to 30 doses for cramping. Take during menstrual periods for cramping pain 12/09/20  Yes Lizzet Hendley, Carola Rhine, MD  oxyCODONE-acetaminophen (PERCOCET/ROXICET) 5-325 MG tablet Take 1 tablet by mouth every 6 (six) hours as needed for up to 10 doses for severe pain. 12/09/20  Yes Reygan Heagle, Carola Rhine, MD  polyethylene glycol powder (GLYCOLAX/MIRALAX) 17 GM/SCOOP powder Take 255 g by mouth daily as needed for moderate constipation or severe constipation. Do not  take for more than 3 days in a row 12/09/20  Yes Jasmond River, Carola Rhine, MD  Certolizumab Pegol (CIMZIA PREFILLED) 2 X 200 MG/ML KIT INJECT 200 MG (1 SYRINGE) SUBCUTANEOUSLY EVERY 2 WEEKS Patient taking differently: Inject 200 mg into the skin every 14 (fourteen) days. 08/24/20   Bo Merino, MD    Allergies    Patient has no known allergies.  Review of Systems   Review of Systems  Constitutional:  Negative for chills and fever.  HENT:  Negative for ear pain and sore throat.   Respiratory:  Negative for cough and shortness of breath.   Cardiovascular:  Negative for chest pain and palpitations.  Gastrointestinal:  Positive for abdominal pain, constipation and nausea. Negative for vomiting.  Genitourinary:  Positive for menstrual problem, pelvic pain and vaginal bleeding. Negative for dysuria and hematuria.  Musculoskeletal:  Negative for  arthralgias and back pain.  Skin:  Negative for color change and rash.  Neurological:  Negative for syncope and headaches.  All other systems reviewed and are negative.  Physical Exam Updated Vital Signs BP 119/70 (BP Location: Right Arm)   Pulse 79   Temp 97.9 F (36.6 C) (Oral)   Resp 16   SpO2 100%   Physical Exam Constitutional:      General: She is not in acute distress. HENT:     Head: Normocephalic and atraumatic.  Eyes:     Conjunctiva/sclera: Conjunctivae normal.     Pupils: Pupils are equal, round, and reactive to light.  Cardiovascular:     Rate and Rhythm: Normal rate and regular rhythm.  Pulmonary:     Effort: Pulmonary effort is normal. No respiratory distress.  Abdominal:     General: There is no distension.     Tenderness: There is abdominal tenderness in the suprapubic area. There is no guarding or rebound.  Skin:    General: Skin is warm and dry.  Neurological:     General: No focal deficit present.     Mental Status: She is alert. Mental status is at baseline.  Psychiatric:        Mood and Affect: Mood normal.        Behavior: Behavior normal.    ED Results / Procedures / Treatments   Labs (all labs ordered are listed, but only abnormal results are displayed) Labs Reviewed  COMPREHENSIVE METABOLIC PANEL - Abnormal; Notable for the following components:      Result Value   Glucose, Bld 101 (*)    All other components within normal limits  URINALYSIS, ROUTINE W REFLEX MICROSCOPIC - Abnormal; Notable for the following components:   APPearance HAZY (*)    pH 9.0 (*)    Hgb urine dipstick MODERATE (*)    Ketones, ur 80 (*)    Protein, ur 30 (*)    RBC / HPF >50 (*)    All other components within normal limits  CBC  I-STAT BETA HCG BLOOD, ED ([facility], [facility], [facility] ONLY)    EKG None  Radiology DG Abd Portable 1 View  Result Date: 12/09/2020 CLINICAL DATA:  Abdominal pain EXAM: PORTABLE ABDOMEN - 1 VIEW COMPARISON:  None. FINDINGS: Scattered large and  small bowel gas is noted. No obstructive changes are seen. No free air is noted. No findings of constipation are seen. Bony structures are within normal limits. IMPRESSION: No acute abnormality noted. Electronically Signed   By: Inez Catalina M.D.   On: 12/09/2020 21:17   US PELVIC COMPLETE W TRANSVAGINAL AND TORSION  R/O  Result Date: 12/09/2020 CLINICAL DATA:  Right lower quadrant pain for 3 days. EXAM: TRANSABDOMINAL AND TRANSVAGINAL ULTRASOUND OF PELVIS DOPPLER ULTRASOUND OF OVARIES TECHNIQUE: Both transabdominal and transvaginal ultrasound examinations of the pelvis were performed. Transabdominal technique was performed for global imaging of the pelvis including uterus, ovaries, adnexal regions, and pelvic cul-de-sac. It was necessary to proceed with endovaginal exam following the transabdominal exam to visualize the ovaries and endometrium. Color and duplex Doppler ultrasound was utilized to evaluate blood flow to the ovaries. COMPARISON:  None. FINDINGS: Uterus Measurements: 8.5 x 4.2 x 7.4 cm = volume: 136 mL. Uterus is anteverted. There is a right fundal myometrial hyperechoic mass lesion measuring 3 cm maximal dimension. This is consistent with a uterine fibroid. Endometrium Thickness: 9 mm.  No focal abnormality visualized. Right ovary Measurements: 4.6 x 3.2 x 2.6 cm = volume: 20 mL. Normal appearance/no adnexal mass. Left ovary Measurements: 4 x 1.9 x 2.3 cm = volume: 9 mL. Normal appearance/no adnexal mass. Pulsed Doppler evaluation of both ovaries demonstrates normal low-resistance arterial and venous waveforms. Flow is demonstrated within both ovaries on color flow Doppler imaging. Other findings No abnormal free fluid. IMPRESSION: 1. 3 cm diameter uterine fibroid. Normal endometrial stripe thickness. 2. Normal appearance of the ovaries. No evidence of ovarian mass or torsion. Electronically Signed   By: Lucienne Capers M.D.   On: 12/09/2020 19:34    Procedures Procedures   Medications  Ordered in ED Medications  oxyCODONE-acetaminophen (PERCOCET/ROXICET) 5-325 MG per tablet 1 tablet (1 tablet Oral Given 12/09/20 1809)  morphine 4 MG/ML injection 4 mg (4 mg Intravenous Given 12/09/20 2117)  ketorolac (TORADOL) 30 MG/ML injection 30 mg (30 mg Intravenous Given 12/09/20 2118)  sodium chloride 0.9 % bolus 500 mL (0 mLs Intravenous Stopped 12/09/20 2320)    ED Course  I have reviewed the triage vital signs and the nursing notes.  Pertinent labs & imaging results that were available during my care of the patient were reviewed by me and considered in my medical decision making (see chart for details).  Ddx includes dysmenorrhea vs uterine fibroids vs UTI vs constipation vs other  Pregnancy test negative UA without sign of infection WBC normal.  Clinical exam is not consistent with appendicitis, colitis, or other intraabdominal infection Hgb stable and normal here -doubt significant bleed Doubt PID or STI per clinical history  Her pain improved with IV toradol, morphine and IV fluids. Pelvic ultrasound reviewed - no evidence of torsion or ToA or ruptured cyst, but she likely has a uterine fibroid.  This may be the cause of her symptoms, which occur regularly and only during her menses.  Clinical Course as of 12/10/20 1002  Sat Dec 09, 2020  2245 I reassessed the patient with the use of an Fish farm manager.  Her pain is significantly improved.  She feels a lot better and wants to go home.  I think this is reasonable.  She may be dealing with menstrual cramping and possible constipation.  I can prescribe some Motrin and MiraLAX and a few Percocet for breakthrough pain only.  I advise she follow-up with her OB/GYN as scheduled. [MT]    Clinical Course User Index [MT] Haralambos Yeatts, Carola Rhine, MD   M Final Clinical Impression(s) / ED Diagnoses Pelvic pain Dysmenorrhea   Rx / DC Orders ED Discharge Orders          Ordered    ibuprofen (ADVIL) 600 MG tablet  Every 6 hours PRN  12/09/20 2249    polyethylene glycol powder (GLYCOLAX/MIRALAX) 17 GM/SCOOP powder  Daily PRN        12/09/20 2249    oxyCODONE-acetaminophen (PERCOCET/ROXICET) 5-325 MG tablet  Every 6 hours PRN        12/09/20 2249             Wyvonnia Dusky, MD 12/10/20 1002

## 2020-12-09 NOTE — ED Provider Notes (Signed)
AcuteEmergency Medicine Provider Triage Evaluation Note  Kathleen Nguyen , a 35 y.o. female  was evaluated in triage.  Pt complains of 3 days of progressively worsening right lower quadrant pain, vaginal bleeding passing clots.  She states she feels is her menstrual cycle, however her LMP was 09/2020 and she has not taken a pregnancy test.  Stress secondary to pain, tearful.  Denies any associated nausea, vomiting, urinary symptoms, diarrhea.  Denies any chest pain or shortness of breath.  No history of ectopic pregnancy in the past.  Review of Systems  Positive: R LQ pain, vaginal bleeding with clots Negative: Chest pain, shortness of breath, palpitations, vomiting.   Physical Exam  BP 134/87 (BP Location: Right Arm)   Pulse 88   Temp 97.7 F (36.5 C) (Oral)   Resp 20   SpO2 100%  Gen:   Awake, no distress   Resp:  Normal effort  MSK:   Moves extremities without difficulty  Other:  Right lower quadrant tenderness palpation, no rebound but there is guarding.  Medical Decision Making  Medically screening exam initiated at 6:05 PM.  Appropriate orders placed.  Brynne Doane was informed that the remainder of the evaluation will be completed by another provider, this initial triage assessment does not replace that evaluation, and the importance of remaining in the ED until their evaluation is complete.  This chart was dictated using voice recognition software, Dragon. Despite the best efforts of this provider to proofread and correct errors, errors may still occur which can change documentation meaning.  Given LMP greater than 4 weeks ago, concern for possible ectopic pregnancy.  Will order basic laboratory studies as well as pelvic ultrasound with Doppler. Analgia ordered.   Aura Dials 12/09/20 1809    Arnaldo Natal, MD 12/10/20 1231

## 2020-12-09 NOTE — Discharge Instructions (Addendum)
Instructions:  You are likely having pain from your uterus and uterine fibroid, your menstrual period, but also from constipation.  Please bring all of these papers and results to your OB/GYN's office for your appointment on Thursday.  I want you to begin taking ibuprofen every 6 hours as prescribed.  You should start taking this in the first day of your menstrual period throughout your entire period.    If you have severe pain, you can take a Percocet as well.  Try to avoid using this medication is much as possible.  This medication can cause drowsiness and constipation.  I also prescribed you MiraLAX, powder to help with constipation.  Please mix 1 capful with a glass of water in the morning and drink it tomorrow.

## 2020-12-11 ENCOUNTER — Telehealth: Payer: Self-pay | Admitting: *Deleted

## 2020-12-11 NOTE — Telephone Encounter (Signed)
Transition Care Management Follow-up Telephone Call Date of discharge and from where: 12/09/2020 - [facility] How have you been since you were released from the hospital? "I am fine" Any questions or concerns? No  Items Reviewed: Did the pt receive and understand the discharge instructions provided? Yes  Medications obtained and verified? Yes  Other? No  Any new allergies since your discharge? No  Dietary orders reviewed? No Do you have support at home? Yes    Functional Questionnaire: (I = Independent and D = Dependent) ADLs: I  Bathing/Dressing- I  Meal Prep- I  Eating- I  Maintaining continence- I  Transferring/Ambulation- I  Managing Meds- I  Follow up appointments reviewed:  PCP Hospital f/u appt confirmed? No  Specialist Hospital f/u appt confirmed? Yes  Scheduled to see OBGYN on 12/18/2020 @ 1535. Are transportation arrangements needed? No  If their condition worsens, is the pt aware to call PCP or go to the Emergency Dept.? Yes Was the patient provided with contact information for the PCP's office or ED? Yes Was to pt encouraged to call back with questions or concerns? Yes

## 2020-12-18 ENCOUNTER — Ambulatory Visit: Payer: 59 | Admitting: Obstetrics and Gynecology

## 2020-12-22 ENCOUNTER — Encounter: Payer: Self-pay | Admitting: Family Medicine

## 2020-12-22 ENCOUNTER — Ambulatory Visit (INDEPENDENT_AMBULATORY_CARE_PROVIDER_SITE_OTHER): Payer: 59 | Admitting: Family Medicine

## 2020-12-22 ENCOUNTER — Other Ambulatory Visit: Payer: Self-pay

## 2020-12-22 VITALS — BP 112/73 | HR 74 | Ht 65.0 in | Wt 184.5 lb

## 2020-12-22 DIAGNOSIS — N939 Abnormal uterine and vaginal bleeding, unspecified: Secondary | ICD-10-CM | POA: Diagnosis not present

## 2020-12-22 DIAGNOSIS — Z124 Encounter for screening for malignant neoplasm of cervix: Secondary | ICD-10-CM

## 2020-12-22 NOTE — Progress Notes (Signed)
GYNECOLOGY OFFICE VISIT NOTE  History:   [facility] is a 35 y.o. [phone] here today for irregular, painful periods.  Reports long history of painful periods Has recently become more pronounced, with severe uterine cramps Since delivery of last baby in 2020 period was absent for one year and then has been irregular, coming every two months Heavy for 3 days, goes on for about 9 total This is a change in pattern from prior to her last pregnancy, length of time is the same but now is much heavier and passing clots which did not happen previously Not on birth control Possibly would like more children but more focused on controlling pain right now Last unprotected intercourse one week ago Patient's last menstrual period was 12/07/2020 (exact date).   Health Maintenance Due  Topic Date Due   Pneumococcal Vaccine 44-41 Years old (1 - PCV) Never done   PAP SMEAR-Modifier  01/14/2020   COVID-19 Vaccine (3 - Booster for Janssen series) 07/03/2020    Past Medical History:  Diagnosis Date   Anemia    Ankylosing spondylitis ([place])    Ankylosing spondylitis ([place])    Rheumatoid arthritis ([place])     Past Surgical History:  Procedure Laterality Date   CESAREAN SECTION     CESAREAN SECTION     CESAREAN SECTION N/A 05/03/2019   Procedure: CESAREAN SECTION;  Surgeon: Woodroe Mode, MD;  Location: [facility];  Service: Obstetrics;  Laterality: N/A;    The following portions of the patient's history were reviewed and updated as appropriate: allergies, current medications, past family history, past medical history, past social history, past surgical history and problem list.   Health Maintenance:   Last pap: Lab Results  Component Value Date   DIAGPAP Molecular only (A) 04/05/2019   HPV NOT DETECTED 01/13/2017    Last mammogram:  N/a   Review of Systems:  Pertinent items noted in HPI and remainder of comprehensive ROS otherwise negative.  Physical Exam:  BP 112/73   Pulse 74    Ht 5\' 5"  (1.651 m)   Wt 184 lb 8 oz (83.7 kg)   LMP 12/07/2020 (Exact Date)   BMI 30.70 kg/m  CONSTITUTIONAL: Well-developed, well-nourished female in no acute distress.  HEENT:  Normocephalic, atraumatic. External right and left ear normal. No scleral icterus.  NECK: Normal range of motion, supple, no masses noted on observation SKIN: No rash noted. Not diaphoretic. No erythema. No pallor. MUSCULOSKELETAL: Normal range of motion. No edema noted. NEUROLOGIC: Alert and oriented to person, place, and time. Normal muscle tone coordination.  PSYCHIATRIC: Normal mood and affect. Normal behavior. Normal judgment and thought content. RESPIRATORY: Effort normal, no problems with respiration noted  Labs and Imaging No results found for this or any previous visit (from the past 168 hour(s)). DG Abd Portable 1 View  Result Date: 12/09/2020 CLINICAL DATA:  Abdominal pain EXAM: PORTABLE ABDOMEN - 1 VIEW COMPARISON:  None. FINDINGS: Scattered large and small bowel gas is noted. No obstructive changes are seen. No free air is noted. No findings of constipation are seen. Bony structures are within normal limits. IMPRESSION: No acute abnormality noted. Electronically Signed   By: Inez Catalina M.D.   On: 12/09/2020 21:17   US PELVIC COMPLETE W TRANSVAGINAL AND TORSION R/O  Result Date: 12/09/2020 CLINICAL DATA:  Right lower quadrant pain for 3 days. EXAM: TRANSABDOMINAL AND TRANSVAGINAL ULTRASOUND OF PELVIS DOPPLER ULTRASOUND OF OVARIES TECHNIQUE: Both transabdominal and transvaginal ultrasound examinations of the pelvis were performed.  Transabdominal technique was performed for global imaging of the pelvis including uterus, ovaries, adnexal regions, and pelvic cul-de-sac. It was necessary to proceed with endovaginal exam following the transabdominal exam to visualize the ovaries and endometrium. Color and duplex Doppler ultrasound was utilized to evaluate blood flow to the ovaries. COMPARISON:  None. FINDINGS:  Uterus Measurements: 8.5 x 4.2 x 7.4 cm = volume: 136 mL. Uterus is anteverted. There is a right fundal myometrial hyperechoic mass lesion measuring 3 cm maximal dimension. This is consistent with a uterine fibroid. Endometrium Thickness: 9 mm.  No focal abnormality visualized. Right ovary Measurements: 4.6 x 3.2 x 2.6 cm = volume: 20 mL. Normal appearance/no adnexal mass. Left ovary Measurements: 4 x 1.9 x 2.3 cm = volume: 9 mL. Normal appearance/no adnexal mass. Pulsed Doppler evaluation of both ovaries demonstrates normal low-resistance arterial and venous waveforms. Flow is demonstrated within both ovaries on color flow Doppler imaging. Other findings No abnormal free fluid. IMPRESSION: 1. 3 cm diameter uterine fibroid. Normal endometrial stripe thickness. 2. Normal appearance of the ovaries. No evidence of ovarian mass or torsion. Electronically Signed   By: Lucienne Capers M.D.   On: 12/09/2020 19:34      Assessment and Plan:   Problem List Items Addressed This Visit       Genitourinary   Abnormal uterine bleeding (AUB)    Change in bleeding pattern over the past 8 months or so, mix of menorrhagia and oligomenorrhea. Discussed need for endometrial sampling given age and BMI, she is in agreement with this plan. Also discussed options for dysmenorrhea including pills, IUD. She brought up surgery but I suggested that given lack of significant fibroid burden would be a better idea to start with more conservative treatments. She would like hormonal IUD, will obtain EMB and pending results likely place hormonal IUD. She understands this is contraception and will need to be removed if she decides to get try to get pregnant again. Counseled on no unprotected sex until next visit.        Other Visit Diagnoses     Screening for cervical cancer    -  Primary       Routine preventative health maintenance measures emphasized. Please refer to After Visit Summary for other counseling recommendations.    Return in about 2 weeks (around 01/05/2021) for endometrial biopsy, pap smear.    Total face-to-face time with patient: 20 minutes.  Over 50% of encounter was spent on counseling and coordination of care.   Clarnce Flock, MD/MPH Attending Family Medicine Physician, [facility] for [facility], [place]

## 2020-12-22 NOTE — Assessment & Plan Note (Addendum)
Change in bleeding pattern over the past 8 months or so, mix of menorrhagia and oligomenorrhea. Discussed need for endometrial sampling given age and BMI, she is in agreement with this plan. Also discussed options for dysmenorrhea including pills, IUD. She brought up surgery but I suggested that given lack of significant fibroid burden would be a better idea to start with more conservative treatments. She would like hormonal IUD, will obtain EMB and pending results likely place hormonal IUD. She understands this is contraception and will need to be removed if she decides to get try to get pregnant again. Counseled on no unprotected sex until next visit.   Patient seen with assistance of in person interpreter (Arabic).

## 2021-01-09 ENCOUNTER — Ambulatory Visit: Payer: 59 | Admitting: Family Medicine

## 2021-01-15 NOTE — Progress Notes (Signed)
Office Visit Note  Patient: [facility]             Date of Birth: 18-Mar-1986           MRN: [id number]             PCP: Clent Demark, PA-C Referring: Clent Demark, PA-C Visit Date: 01/29/2021 Occupation: '@GUAROCC'$ @  Interpreter: Yong Channel  Subjective:  Medication management.   History of Present Illness: [facility] is a 35 y.o. female with history of ankylosing spondylitis.  She has been on Cimzia subcutaneous in office injections.  She has been tolerating injections well.  She denies any joint swelling.  She noticed improvement in the SI joint pain.  She states she has redness in her eyes off and on.  She has an appointment coming up with the ophthalmologist.  Last appointment was 6 months ago.  Activities of Daily Living:  Patient reports morning stiffness for 5 minutes.   Patient Denies nocturnal pain.  Difficulty dressing/grooming: Denies Difficulty climbing stairs: Denies Difficulty getting out of chair: Denies Difficulty using hands for taps, buttons, cutlery, and/or writing: Reports  Review of Systems  Constitutional:  Positive for fatigue.  HENT:  Positive for mouth dryness. Negative for mouth sores and nose dryness.   Eyes:  Negative for pain, itching and dryness.  Respiratory:  Negative for shortness of breath and difficulty breathing.   Cardiovascular:  Negative for chest pain and palpitations.  Gastrointestinal:  Negative for blood in stool, constipation and diarrhea.  Endocrine: Negative for increased urination.  Genitourinary:  Negative for difficulty urinating.  Musculoskeletal:  Positive for joint swelling and morning stiffness. Negative for joint pain, joint pain, myalgias, muscle tenderness and myalgias.  Skin:  Negative for color change, rash and redness.  Allergic/Immunologic: Negative for susceptible to infections.  Neurological:  Positive for headaches. Negative for dizziness, numbness, memory loss and weakness.  Hematological:   Negative for bruising/bleeding tendency.  Psychiatric/Behavioral:  Negative for confusion.    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   Anemia in pregnancy 12/07/2018   Language barrier 12/04/2018   History of cesarean delivery 12/04/2018   Ankylosing spondylitis ([place]) 11/11/2018   Uterine adenomyoma 12/11/2016    Past Medical History:  Diagnosis Date   Anemia    Ankylosing spondylitis ([facility])    Ankylosing spondylitis ([facility])    Rheumatoid arthritis ([place])     Family History  Problem Relation Age of Onset   Arthritis/Rheumatoid Mother    Past Surgical History:  Procedure Laterality Date   CESAREAN SECTION     CESAREAN SECTION     CESAREAN SECTION N/A 05/03/2019   Procedure: CESAREAN SECTION;  Surgeon: Woodroe Mode, MD;  Location: [facility];  Service: Obstetrics;  Laterality: N/A;   Social History   Social History Narrative   Not on file   Immunization History  Administered Date(s) Administered   Influenza,inj,Quad PF,6+ Mos 02/22/2019   Janssen (J&J) SARS-COV-2 Vaccination 09/18/2019   PFIZER(Purple Top)SARS-COV-2 Vaccination 05/08/2020   Tdap 12/18/2017, 02/22/2019     Objective: Vital Signs: BP 109/72 (BP Location: Left Arm, Patient Position: Sitting, Cuff Size: Normal)   Pulse 96   Ht '5\' 5"'$  (1.651 m)   Wt 187 lb 3.2 oz (84.9 kg)   BMI 31.15 kg/m    Physical Exam Vitals and nursing note reviewed.  Constitutional:      Appearance:  She is well-developed.  HENT:     Head: Normocephalic and atraumatic.  Eyes:     Conjunctiva/sclera: Conjunctivae normal.  Cardiovascular:     Rate and Rhythm: Normal rate and regular rhythm.     Heart sounds: Normal heart sounds.  Pulmonary:     Effort: Pulmonary effort is normal.     Breath sounds: Normal breath sounds.  Abdominal:     General: Bowel sounds are normal.     Palpations: Abdomen is soft.   Musculoskeletal:     Cervical back: Normal range of motion.  Lymphadenopathy:     Cervical: No cervical adenopathy.  Skin:    General: Skin is warm and dry.     Capillary Refill: Capillary refill takes less than 2 seconds.  Neurological:     Mental Status: She is alert and oriented to person, place, and time.  Psychiatric:        Behavior: Behavior normal.     Musculoskeletal Exam: C-spine thoracic and lumbar spine were in good range of motion.  She had no SI joint tenderness.  Shoulder joints, elbow joints, wrist joints, MCPs PIPs and DIPs with good range of motion with no synovitis.  Hip joints, knee joints, ankles, MTPs and PIPs with good range of motion with no synovitis.  CDAI Exam: CDAI Score: -- Patient Global: --; Provider Global: -- Swollen: --; Tender: -- Joint Exam 01/29/2021   No joint exam has been documented for this visit   There is currently no information documented on the homunculus. Go to the Rheumatology activity and complete the homunculus joint exam.  Investigation: No additional findings.  Imaging: No results found.  Recent Labs: Lab Results  Component Value Date   WBC 8.0 12/09/2020   HGB 12.5 12/09/2020   PLT 329 12/09/2020   NA 136 12/09/2020   K 3.6 12/09/2020   CL 104 12/09/2020   CO2 23 12/09/2020   GLUCOSE 101 (H) 12/09/2020   BUN 9 12/09/2020   CREATININE 0.49 12/09/2020   BILITOT 0.7 12/09/2020   ALKPHOS 44 12/09/2020   AST 15 12/09/2020   ALT 14 12/09/2020   PROT 7.9 12/09/2020   ALBUMIN 4.3 12/09/2020   CALCIUM 9.2 12/09/2020   GFRAA 144 10/25/2020   QFTBGOLDPLUS NEGATIVE 04/17/2020    Speciality Comments: No specialty comments available.  Procedures:  No procedures performed Allergies: Patient has no known allergies.   Assessment / Plan:     Visit Diagnoses: Ankylosing spondylitis of lumbosacral region (HCC)-patient is clinically doing well on Cimzia subcutaneous injections.  She had no synovitis on my examination.   She had no tenderness on palpation.  There was no evidence of plantar fasciitis, Achilles tendinitis or SI joint discomfort.  High risk medication use - Cimzia 200 mg sq injections every 2 weeks.  Labs from December 09, 2020 were normal.  TB gold was negative on April 17, 2020.  She has been advised to get labs every 3 months and TB Gold with her next labs.   - Plan: QuantiFERON-TB Gold Plus.  She was also advised to get up-to-date immunization.  Handout was placed in the AVS.  She was advised to stop Cimzia in case she develops an infection.  She may restart Cimzia once the infection resolves.  She is advised to get annual skin examination to screen for nonmelanoma skin cancer while she is on Cimzia.  She is not using any contraception and may conceive in the future.  Sacroiliitis (HCC)-she had no tenderness on examination.  Left eye pain-she gives history of intermittent eye discomfort and redness.  No conjunctival injection was noted.  She has an appointment coming up with the ophthalmologist.  Uterine adenomyoma  Language barrier-interpreter was present during the visit.  Orders: Orders Placed This Encounter  Procedures   QuantiFERON-TB Gold Plus    No orders of the defined types were placed in this encounter.   Follow-Up Instructions: Return in about 3 months (around 05/01/2021) for Ankylosing spondylitis.   Bo Merino, MD  Note - This record has been created using Editor, commissioning.  Chart creation errors have been sought, but may not always  have been located. Such creation errors do not reflect on  the standard of medical care.

## 2021-01-22 ENCOUNTER — Ambulatory Visit: Payer: 59 | Admitting: Rheumatology

## 2021-01-29 ENCOUNTER — Ambulatory Visit (INDEPENDENT_AMBULATORY_CARE_PROVIDER_SITE_OTHER): Payer: 59 | Admitting: Rheumatology

## 2021-01-29 ENCOUNTER — Encounter: Payer: Self-pay | Admitting: Rheumatology

## 2021-01-29 ENCOUNTER — Other Ambulatory Visit: Payer: Self-pay

## 2021-01-29 VITALS — BP 109/72 | HR 96 | Ht 65.0 in | Wt 187.2 lb

## 2021-01-29 DIAGNOSIS — H5712 Ocular pain, left eye: Secondary | ICD-10-CM

## 2021-01-29 DIAGNOSIS — M457 Ankylosing spondylitis of lumbosacral region: Secondary | ICD-10-CM

## 2021-01-29 DIAGNOSIS — D269 Other benign neoplasm of uterus, unspecified: Secondary | ICD-10-CM

## 2021-01-29 DIAGNOSIS — M461 Sacroiliitis, not elsewhere classified: Secondary | ICD-10-CM

## 2021-01-29 DIAGNOSIS — M25462 Effusion, left knee: Secondary | ICD-10-CM

## 2021-01-29 DIAGNOSIS — Z789 Other specified health status: Secondary | ICD-10-CM

## 2021-01-29 DIAGNOSIS — Z79899 Other long term (current) drug therapy: Secondary | ICD-10-CM | POA: Diagnosis not present

## 2021-01-29 DIAGNOSIS — B353 Tinea pedis: Secondary | ICD-10-CM

## 2021-01-29 NOTE — Patient Instructions (Signed)
Standing Labs We placed an order today for your standing lab work.   Please have your standing labs drawn in September and every 3 months  If possible, please have your labs drawn 2 weeks prior to your appointment so that the provider can discuss your results at your appointment.  Please note that you may see your imaging and lab results in [place] before we have reviewed them. We may be awaiting multiple results to interpret others before contacting you. Please allow our office up to 72 hours to thoroughly review all of the results before contacting the office for clarification of your results.  We have open lab daily: Monday through Thursday from 1:30-4:30 PM and Friday from 1:30-4:00 PM at the office of Dr. Bo Merino, [facility].   Please be advised, all patients with office appointments requiring lab work will take precedent over walk-in lab work.  If possible, please come for your lab work on Monday and Friday afternoons, as you may experience shorter wait times. The office is located at [address] [place], [place], [place] [address] No appointment is necessary.   Labs are drawn by [facility]. Please bring your co-pay at the time of your lab draw.  You may receive a bill from [place] for your lab work.  If you wish to have your labs drawn at another location, please call the office 24 hours in advance to send orders.  If you have any questions regarding directions or hours of operation,  please call [phone].   As a reminder, please drink plenty of water prior to coming for your lab work. Thanks!    Back Exercises The following exercises strengthen the muscles that help to support the trunk and back. They also help to keep the lower back flexible. Doing these exercises can help to prevent back pain or lessen existing pain. If you have back pain or discomfort, try doing these exercises 2-3 times each day or as told by your health care provider. As your pain  improves, do them once each day, but increase the number of times that you repeat the steps for each exercise (do more repetitions). To prevent the recurrence of back pain, continue to do these exercises once each day or as told by your health care provider. Do exercises exactly as told by your health care provider and adjust them as directed. It is normal to feel mild stretching, pulling, tightness, or discomfort as you do these exercises, but you should stop right away if youfeel sudden pain or your pain gets worse. Exercises Single knee to chest Repeat these steps 3-5 times for each leg: Lie on your back on a firm bed or the floor with your legs extended. Bring one knee to your chest. Your other leg should stay extended and in contact with the floor. Hold your knee in place by grabbing your knee or thigh with both hands and hold. Pull on your knee until you feel a gentle stretch in your lower back or buttocks. Hold the stretch for 10-30 seconds. Slowly release and straighten your leg. Pelvic tilt Repeat these steps 5-10 times: Lie on your back on a firm bed or the floor with your legs extended. Bend your knees so they are pointing toward the ceiling and your feet are flat on the floor. Tighten your lower abdominal muscles to press your lower back against the floor. This motion will tilt your pelvis so your tailbone points up toward the ceiling instead of pointing to your  feet or the floor. With gentle tension and even breathing, hold this position for 5-10 seconds. Cat-cow Repeat these steps until your lower back becomes more flexible: Get into a hands-and-knees position on a firm surface. Keep your hands under your shoulders, and keep your knees under your hips. You may place padding under your knees for comfort. Let your head hang down toward your chest. Contract your abdominal muscles and point your tailbone toward the floor so your lower back becomes rounded like the back of a cat. Hold  this position for 5 seconds. Slowly lift your head, let your abdominal muscles relax and point your tailbone up toward the ceiling so your back forms a sagging arch like the back of a cow. Hold this position for 5 seconds.  Press-ups Repeat these steps 5-10 times: Lie on your abdomen (face-down) on the floor. Place your palms near your head, about shoulder-width apart. Keeping your back as relaxed as possible and keeping your hips on the floor, slowly straighten your arms to raise the top half of your body and lift your shoulders. Do not use your back muscles to raise your upper torso. You may adjust the placement of your hands to make yourself more comfortable. Hold this position for 5 seconds while you keep your back relaxed. Slowly return to lying flat on the floor.  Bridges Repeat these steps 10 times: Lie on your back on a firm surface. Bend your knees so they are pointing toward the ceiling and your feet are flat on the floor. Your arms should be flat at your sides, next to your body. Tighten your buttocks muscles and lift your buttocks off the floor until your waist is at almost the same height as your knees. You should feel the muscles working in your buttocks and the back of your thighs. If you do not feel these muscles, slide your feet 1-2 inches farther away from your buttocks. Hold this position for 3-5 seconds. Slowly lower your hips to the starting position, and allow your buttocks muscles to relax completely. If this exercise is too easy, try doing it with your arms crossed over yourchest. Abdominal crunches Repeat these steps 5-10 times: Lie on your back on a firm bed or the floor with your legs extended. Bend your knees so they are pointing toward the ceiling and your feet are flat on the floor. Cross your arms over your chest. Tip your chin slightly toward your chest without bending your neck. Tighten your abdominal muscles and slowly raise your trunk (torso) high enough  to lift your shoulder blades a tiny bit off the floor. Avoid raising your torso higher than that because it can put too much stress on your low back and does not help to strengthen your abdominal muscles. Slowly return to your starting position. Back lifts Repeat these steps 5-10 times: Lie on your abdomen (face-down) with your arms at your sides, and rest your forehead on the floor. Tighten the muscles in your legs and your buttocks. Slowly lift your chest off the floor while you keep your hips pressed to the floor. Keep the back of your head in line with the curve in your back. Your eyes should be looking at the floor. Hold this position for 3-5 seconds. Slowly return to your starting position. Contact a health care provider if: Your back pain or discomfort gets much worse when you do an exercise. Your worsening back pain or discomfort does not lessen within 2 hours after you exercise. If  you have any of these problems, stop doing these exercises right away. Do not do them again unless your health care provider says that you can. Get help right away if: You develop sudden, severe back pain. If this happens, stop doing the exercises right away. Do not do them again unless your health care provider says that you can. This information is not intended to replace advice given to you by your health care provider. Make sure you discuss any questions you have with your healthcare provider. Document Revised: 10/15/2018 Document Reviewed: 03/12/2018 Elsevier Patient Education  [place]. Hip Exercises Ask your health care provider which exercises are safe for you. Do exercises exactly as told by your health care provider and adjust them as directed. It is normal to feel mild stretching, pulling, tightness, or discomfort as you do these exercises. Stop right away if you feel sudden pain or your pain gets worse. Do not begin these exercises until told by your health care provider. Stretching and  range-of-motion exercises These exercises warm up your muscles and joints and improve the movement and flexibility of your hip. These exercises also help to relieve pain, numbness, and tingling. You may be asked to limit your range of motion if you had a hipreplacement. Talk to your health care provider about these restrictions. Hamstrings, supine  Lie on your back (supine position). Loop a belt or towel over the ball of your left / right foot. The ball of your foot is on the walking surface, right under your toes. Straighten your left / right knee and slowly pull on the belt or towel to raise your leg until you feel a gentle stretch behind your knee (hamstring). Do not let your knee bend while you do this. Keep your other leg flat on the floor. Hold this position for __________ seconds. Slowly return your leg to the starting position. Repeat __________ times. Complete this exercise __________ times a day. Hip rotation  Lie on your back on a firm surface. With your left / right hand, gently pull your left / right knee toward the shoulder that is on the same side of the body. Stop when your knee is pointing toward the ceiling. Hold your left / right ankle with your other hand. Keeping your knee steady, gently pull your left / right ankle toward your other shoulder until you feel a stretch in your buttocks. Keep your hips and shoulders firmly planted while you do this stretch. Hold this position for __________ seconds. Repeat __________ times. Complete this exercise __________ times a day. Seated stretch This exercise is sometimes called hamstrings and adductors stretch. Sit on the floor with your legs stretched wide. Keep your knees straight during this exercise. Keeping your head and back in a straight line, bend at your waist to reach for your left foot (position A). You should feel a stretch in your right inner thigh (adductors). Hold this position for __________ seconds. Then slowly  return to the upright position. Keeping your head and back in a straight line, bend at your waist to reach forward (position B). You should feel a stretch behind both of your thighs and knees (hamstrings). Hold this position for __________ seconds. Then slowly return to the upright position. Keeping your head and back in a straight line, bend at your waist to reach for your right foot (position C). You should feel a stretch in your left inner thigh (adductors). Hold this position for __________ seconds. Then slowly return to the upright  position. Repeat __________ times. Complete this exercise __________ times a day. Lunge This exercise stretches the muscles of the hip (hip flexors). Place your left / right knee on the floor and bend your other knee so that is directly over your ankle. You should be half-kneeling. Keep good posture with your head over your shoulders. Tighten your buttocks to point your tailbone downward. This will prevent your back from arching too much. You should feel a gentle stretch in the front of your left / right thigh and hip. If you do not feel a stretch, slide your other foot forward slightly and then slowly lunge forward with your chest up until your knee once again lines up over your ankle. Make sure your tailbone continues to point downward. Hold this position for __________ seconds. Slowly return to the starting position. Repeat __________ times. Complete this exercise __________ times a day. Strengthening exercises These exercises build strength and endurance in your hip. Endurance is theability to use your muscles for a long time, even after they get tired. Bridge This exercise strengthens the muscles of your hip (hip extensors). Lie on your back on a firm surface with your knees bent and your feet flat on the floor. Tighten your buttocks muscles and lift your bottom off the floor until the trunk of your body and your hips are level with your thighs. Do not arch  your back. You should feel the muscles working in your buttocks and the back of your thighs. If you do not feel these muscles, slide your feet 1-2 inches (2.5-5 cm) farther away from your buttocks. Hold this position for __________ seconds. Slowly lower your hips to the starting position. Let your muscles relax completely between repetitions. Repeat __________ times. Complete this exercise __________ times a day. Straight leg raises, side-lying This exercise strengthens the muscles that move the hip joint away from the center of the body (hip abductors). Lie on your side with your left / right leg in the top position. Lie so your head, shoulder, hip, and knee line up. You may bend your bottom knee slightly to help you balance. Roll your hips slightly forward, so your hips are stacked directly over each other and your left / right knee is facing forward. Leading with your heel, lift your top leg 4-6 inches (10-15 cm). You should feel the muscles in your top hip lifting. Do not let your foot drift forward. Do not let your knee roll toward the ceiling. Hold this position for __________ seconds. Slowly return to the starting position. Let your muscles relax completely between repetitions. Repeat __________ times. Complete this exercise __________ times a day. Straight leg raises, side-lying This exercise strengthens the muscles that move the hip joint toward the center of the body (hip adductors). Lie on your side with your left / right leg in the bottom position. Lie so your head, shoulder, hip, and knee line up. You may place your upper foot in front to help you balance. Roll your hips slightly forward, so your hips are stacked directly over each other and your left / right knee is facing forward. Tense the muscles in your inner thigh and lift your bottom leg 4-6 inches (10-15 cm). Hold this position for __________ seconds. Slowly return to the starting position. Let your muscles relax  completely between repetitions. Repeat __________ times. Complete this exercise __________ times a day. Straight leg raises, supine This exercise strengthens the muscles in the front of your thigh (quadriceps). Lie on your back (  supine position) with your left / right leg extended and your other knee bent. Tense the muscles in the front of your left / right thigh. You should see your kneecap slide up or see increased dimpling just above your knee. Keep these muscles tight as you raise your leg 4-6 inches (10-15 cm) off the floor. Do not let your knee bend. Hold this position for __________ seconds. Keep these muscles tense as you lower your leg. Relax the muscles slowly and completely between repetitions. Repeat __________ times. Complete this exercise __________ times a day. Hip abductors, standing This exercise strengthens the muscles that move the leg and hip joint away from the center of the body (hip abductors). Tie one end of a rubber exercise band or tubing to a secure surface, such as a chair, table, or pole. Loop the other end of the band or tubing around your left / right ankle. Keeping your ankle with the band or tubing directly opposite the secured end, step away until there is tension in the tubing or band. Hold on to a chair, table, or pole as needed for balance. Lift your left / right leg out to your side. While you do this: Keep your back upright. Keep your shoulders over your hips. Keep your toes pointing forward. Make sure to use your hip muscles to slowly lift your leg. Do not tip your body or forcefully lift your leg. Hold this position for __________ seconds. Slowly return to the starting position. Repeat __________ times. Complete this exercise __________ times a day. Squats This exercise strengthens the muscles in the front of your thigh (quadriceps). Stand in a door frame so your feet and knees are in line with the frame. You may place your hands on the frame for  balance. Slowly bend your knees and lower your hips like you are going to sit in a chair. Keep your lower legs in a straight-up-and-down position. Do not let your hips go lower than your knees. Do not bend your knees lower than told by your health care provider. If your hip pain increases, do not bend as low. Hold this position for ___________ seconds. Slowly push with your legs to return to standing. Do not use your hands to pull yourself to standing. Repeat __________ times. Complete this exercise __________ times a day. This information is not intended to replace advice given to you by your health care provider. Make sure you discuss any questions you have with your healthcare provider. Document Revised: 01/14/2019 Document Reviewed: 04/21/2018 Elsevier Patient Education  2022 [place] are taking a medication(s) that can suppress your immune system.  The following immunizations are recommended: Flu annually Covid-19  Td/Tdap (tetanus, diphtheria, pertussis) every 10 years Pneumonia (Prevnar 15 then Pneumovax 23 at least 1 year apart.  Alternatively, can take Prevnar 20 without needing additional dose) Shingrix (after age 77): 2 doses from 4 weeks to 6 months apart  Please check with your PCP to make sure you are up to date.   If you test POSITIVE for COVID19 and have MILD to MODERATE symptoms: First, call your PCP if you would like to receive COVID19 treatment AND Hold your medications during the infection and for at least 1 week after your symptoms have resolved: Injectable medication (Benlysta, Cimzia, Cosentyx, Enbrel, Humira, Orencia, Remicade, Simponi, Stelara, Taltz, Tremfya) Methotrexate Leflunomide (Arava) Azathioprine Mycophenolate (Cellcept) Roma Kayser, or Rinvoq Otezla If you take Actemra or Kevzara, you DO NOT need to hold these for COVID19  infection.  If you test POSITIVE for COVID19 and have NO symptoms: First, call your PCP if you would  like to receive COVID19 treatment AND Hold your medications for at least 10 days after the day that you tested positive Injectable medication (Benlysta, Cimzia, Cosentyx, Enbrel, Humira, Orencia, Remicade, Simponi, Stelara, Taltz, Tremfya) Methotrexate Leflunomide (Arava) Azathioprine Mycophenolate (Cellcept) Roma Kayser, or Rinvoq Otezla If you take Actemra or Kevzara, you DO NOT need to hold these for COVID19 infection.  If you have signs or symptoms of an infection or start antibiotics: First, call your PCP for workup of your infection. Hold your medication through the infection, until you complete your antibiotics, and until symptoms resolve if you take the following: Injectable medication (Actemra, Benlysta, Cimzia, Cosentyx, Enbrel, Humira, Kevzara, Orencia, Remicade, Simponi, Stelara, Taltz, Tremfya) Methotrexate Leflunomide (Arava) Mycophenolate (Cellcept) Roma Kayser, or Rinvoq   Please get an annual skin examination by the dermatologist to screen for nonmelanoma skin cancer while you are on Cimzia injections

## 2021-02-02 ENCOUNTER — Other Ambulatory Visit (HOSPITAL_COMMUNITY)
Admission: RE | Admit: 2021-02-02 | Discharge: 2021-02-02 | Disposition: A | Discharge status: Home / Self Care | Payer: 59 | Source: Ambulatory Visit | Attending: Family Medicine | Admitting: Family Medicine

## 2021-02-02 ENCOUNTER — Other Ambulatory Visit: Payer: Self-pay

## 2021-02-02 ENCOUNTER — Encounter: Payer: Self-pay | Admitting: Family Medicine

## 2021-02-02 ENCOUNTER — Ambulatory Visit (INDEPENDENT_AMBULATORY_CARE_PROVIDER_SITE_OTHER): Payer: 59 | Admitting: Family Medicine

## 2021-02-02 VITALS — BP 110/67 | HR 86 | Ht 65.0 in | Wt 186.3 lb

## 2021-02-02 DIAGNOSIS — Z124 Encounter for screening for malignant neoplasm of cervix: Secondary | ICD-10-CM | POA: Insufficient documentation

## 2021-02-02 DIAGNOSIS — N939 Abnormal uterine and vaginal bleeding, unspecified: Secondary | ICD-10-CM

## 2021-02-02 DIAGNOSIS — Z3202 Encounter for pregnancy test, result negative: Secondary | ICD-10-CM | POA: Diagnosis not present

## 2021-02-02 LAB — POCT PREGNANCY, URINE: Preg Test, Ur: NEGATIVE

## 2021-02-02 MED ORDER — OXYCODONE-ACETAMINOPHEN 5-325 MG PO TABS: 1.0000 | ORAL_TABLET | Freq: Four times a day (QID) | ORAL | 0 refills | Status: DC | PRN

## 2021-02-02 NOTE — Progress Notes (Signed)
    GYNECOLOGY CLINIC ENDOMETRIAL BIOPSY PROCEDURE NOTE  Kathleen Nguyen is a y 35 y.o. [phone] here for endometrial biopsy for abnormal uterine bleeding.  Discussed nature of the procedure and risks and benefits.  Pregnancy test:  Lab Results  Component Value Date   PREGTESTUR NEGATIVE 02/02/2021    No Known Allergies  Patient given informed consent, signed copy in the chart, time out was performed.    The patient was placed in the lithotomy position and the cervix brought into view with sterile speculum.  Cervix cleansed x 2 with betadine swabs. A tenaculum was placed in the anterior lip of the cervix. The uterus was sounded for depth of 10 cm after use of os finder for dilation. A pipelle was introduced to into the uterus, suction created,  and an endometrial sample was obtained. All equipment was removed and accounted for.  The patient tolerated the procedure well.   Patient given post procedure instructions.  Per patient preference she will be notified of results by telephone with Arabic interpreter. Also given small amount of percocet for pain.   Clarnce Flock, MD/MPH Attending Family Medicine Physician, [facility] for [facility], [place]

## 2021-02-02 NOTE — Assessment & Plan Note (Signed)
EMB collected today, see separate note.  Pap also collected as she was due.  Follow up pending biopsy results.

## 2021-02-02 NOTE — Progress Notes (Signed)
GYNECOLOGY OFFICE VISIT NOTE  History:   Kathleen Nguyen is a 35 y.o. [phone] here today for AUB.  Last seen 12/22/20, at that time reported new heavier and prolonged bleeding compared to prior menses pattern Also counseled on hormonal IUD, but defer decision until biopsy is resulted Also due for pap  Health Maintenance Due  Topic Date Due   Pneumococcal Vaccine 70-66 Years old (1 - PCV) Never done   PAP SMEAR-Modifier  01/14/2020   COVID-19 Vaccine (3 - Booster for Janssen series) 07/03/2020   INFLUENZA VACCINE  01/22/2021    Past Medical History:  Diagnosis Date   Anemia    Ankylosing spondylitis ([facility])    Ankylosing spondylitis ([place])    Rheumatoid arthritis ([place])     Past Surgical History:  Procedure Laterality Date   CESAREAN SECTION     CESAREAN SECTION     CESAREAN SECTION N/A 05/03/2019   Procedure: CESAREAN SECTION;  Surgeon: Woodroe Mode, MD;  Location: [facility] LD ORS;  Service: Obstetrics;  Laterality: N/A;    The following portions of the patient's history were reviewed and updated as appropriate: allergies, current medications, past family history, past medical history, past social history, past surgical history and problem list.   Health Maintenance:   Last pap: Lab Results  Component Value Date   DIAGPAP Molecular only (A) 04/05/2019   HPV NOT DETECTED 01/13/2017     Last mammogram:  N/a    Review of Systems:  Pertinent items noted in HPI and remainder of comprehensive ROS otherwise negative.  Physical Exam:  BP 110/67   Pulse 86   Ht '5\' 5"'$  (1.651 m)   Wt 186 lb 4.8 oz (84.5 kg)   LMP 01/06/2021 (Approximate)   BMI 31.00 kg/m  CONSTITUTIONAL: Well-developed, well-nourished female in no acute distress.  HEENT:  Normocephalic, atraumatic. External right and left ear normal. No scleral icterus.  NECK: Normal range of motion, supple, no masses noted on observation SKIN: No rash noted. Not diaphoretic. No erythema. No pallor. MUSCULOSKELETAL: Normal  range of motion. No edema noted. NEUROLOGIC: Alert and oriented to person, place, and time. Normal muscle tone coordination.  PSYCHIATRIC: Normal mood and affect. Normal behavior. Normal judgment and thought content. RESPIRATORY: Effort normal, no problems with respiration noted ABDOMEN: No masses noted. No other overt distention noted.   PELVIC: Normal appearing external genitalia; normal appearing vaginal mucosa and cervix.  No abnormal discharge noted.    Labs and Imaging Results for orders placed or performed in visit on 02/02/21 (from the past 168 hour(s))  Pregnancy, urine POC   Collection Time: 02/02/21 10:22 AM  Result Value Ref Range   Preg Test, Ur NEGATIVE NEGATIVE   No results found.    Assessment and Plan:   Problem List Items Addressed This Visit       Genitourinary   Abnormal uterine bleeding (AUB) - Primary    EMB collected today, see separate note.  Pap also collected as she was due.  Follow up pending biopsy results.       Relevant Medications   oxyCODONE-acetaminophen (PERCOCET/ROXICET) 5-325 MG tablet   Other Relevant Orders   Pregnancy, urine POC (Completed)   Surgical pathology( [place])   Other Visit Diagnoses     Cervical cancer screening       Relevant Orders   Cytology - PAP( [place])   Screening for cervical cancer           Routine preventative health maintenance measures emphasized.  Please refer to After Visit Summary for other counseling recommendations.   Return for pending biopsy results.    Total face-to-face time with patient: 15 minutes.  Over 50% of encounter was spent on counseling and coordination of care.   Clarnce Flock, MD/MPH Attending Family Medicine Physician, [facility] for [facility], [place]

## 2021-02-05 LAB — SURGICAL PATHOLOGY

## 2021-02-07 ENCOUNTER — Telehealth: Payer: Self-pay

## 2021-02-07 LAB — CYTOLOGY - PAP
Comment: NEGATIVE
Diagnosis: NEGATIVE
High risk HPV: NEGATIVE

## 2021-02-07 NOTE — Telephone Encounter (Addendum)
-----   Message from Clarnce Flock, MD sent at 02/05/2021  2:17 PM EDT ----- Benign path on EMB Clinical staff please call patient with Arabic interpreter and let her know the results were normal. Please also see if she would like to schedule a visit for an IUD insertion or if she would like to discuss some other method for control of her AUB.   Normal pap, repeat in 5 years per [facility]  Please notify patient of this as well when calling   -----------------------------------------------------------------  Called pt with [facility] interpreter ID [phone]. Results given. Pt would like to return for an appt to discuss options to control bleeding. Appt scheduled for 02/21/21 at 1315 and patient notified.

## 2021-02-21 ENCOUNTER — Ambulatory Visit (INDEPENDENT_AMBULATORY_CARE_PROVIDER_SITE_OTHER): Payer: 59 | Admitting: Family Medicine

## 2021-02-21 ENCOUNTER — Other Ambulatory Visit: Payer: Self-pay

## 2021-02-21 ENCOUNTER — Encounter: Payer: Self-pay | Admitting: Family Medicine

## 2021-02-21 VITALS — BP 131/81 | HR 103 | Ht 61.0 in | Wt 185.7 lb

## 2021-02-21 DIAGNOSIS — N939 Abnormal uterine and vaginal bleeding, unspecified: Secondary | ICD-10-CM

## 2021-02-21 DIAGNOSIS — L989 Disorder of the skin and subcutaneous tissue, unspecified: Secondary | ICD-10-CM

## 2021-02-21 DIAGNOSIS — K5900 Constipation, unspecified: Secondary | ICD-10-CM | POA: Diagnosis not present

## 2021-02-21 MED ORDER — POLYETHYLENE GLYCOL 3350 17 GM/SCOOP PO POWD: 1.0000 | Freq: Every day | ORAL | 11 refills | Status: DC | PRN

## 2021-02-21 MED ORDER — NORGESTIMATE-ETH ESTRADIOL 0.25-35 MG-MCG PO TABS: 1.0000 | ORAL_TABLET | Freq: Every day | ORAL | 11 refills | Status: DC

## 2021-02-21 NOTE — Assessment & Plan Note (Signed)
Counseled on options, no contraindications to OCP's, will start with regular 3+1 usage. If periods are not improved with this discussed running packs together to not have a period all together. Reviewed med list, no interactions with OCPs. RTC in 6 months or sooner as needed.   Skin exam benign, given referral per her request.  Also sent refill for miralax.

## 2021-02-21 NOTE — Progress Notes (Signed)
GYNECOLOGY OFFICE VISIT NOTE  History:   Kathleen Nguyen is a 35 y.o. [phone] here today for follow up of AUB.  Over past two months had workup for AUB that was benign EMB pathology normal Pap smear NILM with neg HPV  Today returns for discussion of options for controlling bothersome bleeding Previously discussed hormonal IUD, not interested in that currently Not bleeding currently but has ongoing heavier, longer, more painful periods Thinking about having more children but not for a while No history of high blood pressure, blood clots, migraine with aura  Having some dark spots on her lower extremities when she goes in the sun Her rheumatologist (seen for ankylosing spondylitis) recommended she see a dermatologist  Also very constipated, would like a miralax refill  Health Maintenance Due  Topic Date Due   Pneumococcal Vaccine 45-63 Years old (1 - PCV) Never done   COVID-19 Vaccine (3 - Booster for [employer] series) 07/03/2020   INFLUENZA VACCINE  01/22/2021    Past Medical History:  Diagnosis Date   Anemia    Ankylosing spondylitis ([place])    Ankylosing spondylitis ([place])    Rheumatoid arthritis ([place])     Past Surgical History:  Procedure Laterality Date   CESAREAN SECTION     CESAREAN SECTION     CESAREAN SECTION N/A 05/03/2019   Procedure: CESAREAN SECTION;  Surgeon: Woodroe Mode, MD;  Location: [facility];  Service: Obstetrics;  Laterality: N/A;    The following portions of the patient's history were reviewed and updated as appropriate: allergies, current medications, past family history, past medical history, past social history, past surgical history and problem list.   Health Maintenance:   Last pap: Lab Results  Component Value Date   DIAGPAP  02/02/2021    - Negative for intraepithelial lesion or malignancy (NILM)   HPV NOT DETECTED 01/13/2017   [place] Negative 02/02/2021    Last mammogram:  N/a    Review of Systems:  Pertinent items noted in HPI  and remainder of comprehensive ROS otherwise negative.  Physical Exam:  BP 131/81   Pulse (!) 103   Ht '5\' 1"'$  (1.549 m)   Wt 185 lb 11.2 oz (84.2 kg)   LMP 02/12/2021 (Exact Date)   Breastfeeding No   BMI 35.09 kg/m  CONSTITUTIONAL: Well-developed, well-nourished female in no acute distress.  HEENT:  Normocephalic, atraumatic. External right and left ear normal. No scleral icterus.  NECK: Normal range of motion, supple, no masses noted on observation SKIN: No rash noted. Not diaphoretic. No erythema. No pallor. MUSCULOSKELETAL: Normal range of motion. No edema noted. NEUROLOGIC: Alert and oriented to person, place, and time. Normal muscle tone coordination.  PSYCHIATRIC: Normal mood and affect. Normal behavior. Normal judgment and thought content. RESPIRATORY: Effort normal, no problems with respiration noted   Labs and Imaging No results found for this or any previous visit (from the past 168 hour(s)). No results found.    Assessment and Plan:   Problem List Items Addressed This Visit       Genitourinary   Abnormal uterine bleeding (AUB) - Primary    Counseled on options, no contraindications to OCP's, will start with regular 3+1 usage. If periods are not improved with this discussed running packs together to not have a period all together. Reviewed med list, no interactions with OCPs. RTC in 6 months or sooner as needed.   Skin exam benign, given referral per her request.  Also sent refill for miralax.  Relevant Medications   norgestimate-ethinyl estradiol (ORTHO-CYCLEN) 0.25-35 MG-MCG tablet   Other Visit Diagnoses     Constipation, unspecified constipation type       Relevant Medications   polyethylene glycol powder (GLYCOLAX/MIRALAX) 17 GM/SCOOP powder   Skin abnormality       Relevant Orders   Ambulatory referral to Dermatology       Routine preventative health maintenance measures emphasized. Please refer to After Visit Summary for other counseling  recommendations.   Return in about 6 months (around 08/21/2021), or if symptoms worsen or fail to improve.    Total face-to-face time with patient: 20 minutes.  Over 50% of encounter was spent on counseling and coordination of care.   Clarnce Flock, MD/MPH Attending Family Medicine Physician, [facility] for [facility], [place]

## 2021-02-21 NOTE — Progress Notes (Signed)
Patient declines flu vaccine today  Altha Harm, CMA

## 2021-03-04 ENCOUNTER — Other Ambulatory Visit: Payer: Self-pay | Admitting: Rheumatology

## 2021-03-04 DIAGNOSIS — M457 Ankylosing spondylitis of lumbosacral region: Secondary | ICD-10-CM

## 2021-03-04 DIAGNOSIS — Z79899 Other long term (current) drug therapy: Secondary | ICD-10-CM

## 2021-03-05 NOTE — Telephone Encounter (Signed)
Next Visit: 04/30/2021  Last Visit: 01/29/2021  Last Fill: 08/24/2020  DX:Ankylosing spondylitis of lumbosacral region   Current Dose per office note 01/29/2021: Cimzia 200 mg sq injections every 2 weeks  Labs: 12/09/2020 Glucose 101  TB Gold: 04/17/2020 Neg    Left message to advise patient she is due to update labs.   Okay to refill Cimzia?

## 2021-03-30 ENCOUNTER — Ambulatory Visit (HOSPITAL_COMMUNITY)
Admission: EM | Admit: 2021-03-30 | Discharge: 2021-03-30 | Disposition: A | Discharge status: Home / Self Care | Payer: 59

## 2021-03-30 ENCOUNTER — Encounter (HOSPITAL_COMMUNITY): Payer: Self-pay | Admitting: *Deleted

## 2021-03-30 DIAGNOSIS — D219 Benign neoplasm of connective and other soft tissue, unspecified: Secondary | ICD-10-CM

## 2021-03-30 DIAGNOSIS — D259 Leiomyoma of uterus, unspecified: Secondary | ICD-10-CM

## 2021-03-30 DIAGNOSIS — N946 Dysmenorrhea, unspecified: Secondary | ICD-10-CM

## 2021-03-30 LAB — POCT URINALYSIS DIPSTICK, ED / UC
Bilirubin Urine: NEGATIVE
Glucose, UA: NEGATIVE mg/dL
Leukocytes,Ua: NEGATIVE
Nitrite: NEGATIVE
Protein, ur: 30 mg/dL — AB
Specific Gravity, Urine: >=1.03 (ref 1.005–1.030)
Urobilinogen, UA: 0.2 mg/dL (ref 0.0–1.0)
pH: 6 (ref 5.0–8.0)

## 2021-03-30 LAB — POC URINE PREG, ED: Preg Test, Ur: NEGATIVE

## 2021-03-30 MED ORDER — CELECOXIB 50 MG PO CAPS: 100.0000 mg | ORAL_CAPSULE | Freq: Two times a day (BID) | ORAL | 0 refills | Status: DC | PRN

## 2021-03-30 MED ORDER — HYDROCODONE-ACETAMINOPHEN 5-325 MG PO TABS: 1.0000 | ORAL_TABLET | ORAL | 0 refills | Status: DC | PRN

## 2021-03-30 NOTE — ED Triage Notes (Signed)
[employer]ia [employer] Interpreter:  Pt c/o right low abd "pain w/ period".  Reports LMP started 5 days ago, and started with pain 4 days ago.  Pt states she gets these same severe pains monthly with menses over past 6 cycles.  States she did not have menses in month of Sept, so this is the first period since August 2022; unk if pregnant.

## 2021-03-30 NOTE — ED Provider Notes (Signed)
[facility]    CSN: [id number] Arrival date & time: 03/30/21  1641      History   Chief Complaint Menstrual Pain   HPI Kathleen Nguyen is a 35 y.o. female.  Medical interpreter helped assist with our visit today.  HPI  Menstrual Pain: Patient reports that she has chronically very severe menstrual pain.  She has been diagnosed with fibroids and suspected endometriosis.  She has been prescribed Percocet to use as needed then followed by ibuprofen.  She states that the Percocet works well but the ibuprofen is not very beneficial to her symptoms.  She states that her cycle started about 5 days ago.  She has had some nausea and vomiting along with severe pain for the first few days of her period.  She reports that she has been out of her Percocet medication and asked for refill.  She denies any new or worse changed abdominal pain, dysuria.   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   Anemia in pregnancy 12/07/2018   Language barrier 12/04/2018   History of cesarean delivery 12/04/2018   Ankylosing spondylitis ([place]) 11/11/2018   Uterine adenomyoma 12/11/2016    Past Surgical History:  Procedure Laterality Date   CESAREAN SECTION     CESAREAN SECTION     CESAREAN SECTION N/A 05/03/2019   Procedure: CESAREAN SECTION;  Surgeon: Woodroe Mode, MD;  Location: [facility] LD [facility]S;  Service: Obstetrics;  Laterality: N/A;    OB History     Gravida  4   Para  3   Term  3   Preterm      AB  1   Living  3      SAB  1   IAB      Ectopic      Multiple  0   Live Births  3            Home Medications    Prior to Admission medications   Medication Sig Start Date End Date Taking? Authorizing Provider  ASPIRIN 81 PO Take by  mouth.   Yes [provider]  CIMZIA PREFILLED 2 X 200 MG/ML PSKT INJECT 200MG (1 SYRINGE) SUBCUTANEOUSLY EVERY 2 WEEKS 03/05/21  Yes Ofilia Neas, PA-C  norgestimate-ethinyl estradiol (ORTHO-CYCLEN) 0.25-35 MG-MCG tablet Take 1 tablet by mouth daily. 02/21/21  Yes Clarnce Flock, MD  acetaminophen (TYLENOL) 500 MG tablet Take 500 mg by mouth every 4 (four) hours as needed for mild pain.    [provider]  Carboxymethylcellulose Sodium (DRY EYE RELIEF OP) Place 1 drop into both eyes daily as needed (dry eyes).    [provider]  Certolizumab Pegol (CIMZIA PREFILLED) 2 X 200 MG/ML KIT INJECT 200 MG (1 SYRINGE) SUBCUTANEOUSLY EVERY 2 WEEKS 08/24/20   Bo Merino, MD  oxyCODONE-acetaminophen (PERCOCET/ROXICET) 5-325 MG tablet Take 1 tablet by mouth every 6 (six) hours as needed for up to 10 doses for severe pain. 02/02/21   Clarnce Flock, MD  polyethylene glycol powder (GLYCOLAX/MIRALAX) 17 GM/SCOOP powder Take 255 g by mouth daily as needed for moderate constipation or severe constipation. Do not take for more than 3 days in a row 02/21/21   Clarnce Flock, MD  Family History Family History  Problem Relation Age of Onset   Arthritis/Rheumatoid Mother     Social History Social History   Tobacco Use   Smoking status: Never   Smokeless tobacco: Never  Vaping Use   Vaping Use: Never used  Substance Use Topics   Alcohol use: No   Drug use: No     Allergies   Patient has no known allergies.   Review of Systems Review of Systems  As stated above in HPI Physical Exam Triage Vital Signs ED Triage Vitals  Enc Vitals Group     BP 03/30/21 1755 116/82     Pulse Rate 03/30/21 1755 89     Resp 03/30/21 1755 18     Temp 03/30/21 1755 98.6 F (37 C)     Temp Source 03/30/21 1755 Oral     SpO2 03/30/21 1755 100 %     Weight --      Height --      Head Circumference --      Peak Flow --      Pain Score 03/30/21 1757 4     Pain Loc --       Pain Edu? --      Excl. in [place]? --    No data found.  Updated Vital Signs BP 116/82   Pulse 89   Temp 98.6 F (37 C) (Oral)   Resp 18   LMP 03/25/2021 (Exact Date)   SpO2 100%   Breastfeeding No   Physical Exam Vitals and nursing note reviewed.  Constitutional:      General: She is not in acute distress.    Appearance: Normal appearance. She is not ill-appearing, toxic-appearing or diaphoretic.  HENT:     Head: Normocephalic and atraumatic.  Cardiovascular:     Rate and Rhythm: Normal rate and regular rhythm.  Pulmonary:     Effort: Pulmonary effort is normal.     Breath sounds: Normal breath sounds.  Abdominal:     General: Bowel sounds are normal. There is no distension.     Palpations: Abdomen is soft. There is no mass.     Tenderness: There is no abdominal tenderness. There is no right CVA tenderness, left CVA tenderness, guarding or rebound.     Hernia: No hernia is present.  Skin:    General: Skin is warm.  Neurological:     Mental Status: She is alert and oriented to person, place, and time.     UC Treatments / Results  Labs (all labs ordered are listed, but only abnormal results are displayed) Labs Reviewed  POCT URINALYSIS DIPSTICK, ED / UC - Abnormal; Notable for the following components:      Result Value   Ketones, ur TRACE (*)    Hgb urine dipstick LARGE (*)    Protein, ur 30 (*)    All other components within normal limits  POC URINE PREG, ED    EKG   Radiology No results found.  Procedures Procedures (including critical care time)  Medications Ordered in UC Medications - No data to display  Initial Impression / Assessment and Plan / UC Course  I have reviewed the triage vital signs and the nursing notes.  Pertinent labs & imaging results that were available during my care of the patient were reviewed by me and considered in my medical decision making (see chart for details).     New.  Urine pregnancy test is negative and  urinalysis not suggestive of a urinary  tract infection.  I have reviewed PMP D.  I am agreeable to sending in to [facility] for her to use only in the case of severe pain.  Instead I want her to focus on using Celebrex instead of the ibuprofen.  We discussed how to use this medication.  We also discussed that she can start this medication about 3 days before her menstrual cycle starts and this can help reduce her pain further.  She should continue to follow-up with OB/GYN. Final Clinical Impressions(s) / UC Diagnoses   Final diagnoses:  None   Discharge Instructions   None    ED Prescriptions   None    PDMP not reviewed this encounter.   Hughie Closs, [place] 03/30/21 1846

## 2021-04-03 ENCOUNTER — Telehealth: Payer: Self-pay | Admitting: Family Medicine

## 2021-04-03 NOTE — Telephone Encounter (Signed)
Having bleeding issues want to speak to a nurse

## 2021-04-05 NOTE — Telephone Encounter (Signed)
Called pt with [employer] # [id number] with pt. Pt states having small clots and moderate vaginal bleeding with pain while on her cycle.  Pt states went to Urgent Care on 10/7. Pt was given Rx Norco and Celebrex. Pt is currently taking OCPs to help with her current fibroids.  Pt denies heavy bleeding, large clots or having severe pain. Pt advised to continue to take OCPs, Ibuprofen and Celebrex as directed. Pt verbalized understanding. Pt advised to go to ER if having severe pain that wont go away with meds or having large clots and soaking pads. Pt verbalized understanding and agreeable to plan of care.  Colletta Maryland, RN

## 2021-04-16 NOTE — Progress Notes (Deleted)
Office Visit Note  Patient: Kathleen Nguyen             Date of Birth: [birth year]           MRN: [id number]             PCP: Kathleen Demark, PA-C Referring: Kathleen Demark, PA-C Visit Date: 04/30/2021 Occupation: @GUAROCC @  Subjective:    History of Present Illness: Kathleen Nguyen is a 35 y.o. female with history of ankylosing spondylitis.  She is on cimzia 200 mg sq injections every 2 weeks.   CBC and CMP updated on 12/09/20.   She is overdue to update lab work today.  Her next lab work will be due February and every 3 months.  Standing orders for CBC and CMP are in place. TB gold negative 04/17/20.  Order for TB gold released today.   Activities of Daily Living:  Patient reports morning stiffness for *** {minute/hour:19697}.   Patient {ACTIONS;DENIES/REPORTS:21021675::"Denies"} nocturnal pain.  Difficulty dressing/grooming: {ACTIONS;DENIES/REPORTS:21021675::"Denies"} Difficulty climbing stairs: {ACTIONS;DENIES/REPORTS:21021675::"Denies"} Difficulty getting out of chair: {ACTIONS;DENIES/REPORTS:21021675::"Denies"} Difficulty using hands for taps, buttons, cutlery, and/or writing: {ACTIONS;DENIES/REPORTS:21021675::"Denies"}  No Rheumatology ROS completed.   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   Anemia in pregnancy 12/07/2018   Language barrier 12/04/2018   History of cesarean delivery 12/04/2018   Ankylosing spondylitis ([place]) 11/11/2018   Uterine adenomyoma 12/11/2016    Past Medical History:  Diagnosis Date   Anemia    Ankylosing spondylitis ([facility])    Ankylosing spondylitis ([facility])    Rheumatoid arthritis ([place])     Family History  Problem Relation Age of Onset   Arthritis/Rheumatoid Mother    Past Surgical History:  Procedure Laterality Date   CESAREAN SECTION     CESAREAN SECTION     CESAREAN SECTION N/A 05/03/2019   Procedure: CESAREAN SECTION;   Surgeon: Woodroe Mode, MD;  Location: [facility] LD [facility]S;  Service: Obstetrics;  Laterality: N/A;   Social History   Social History Narrative   Not on file   Immunization History  Administered Date(s) Administered   Influenza,inj,Quad PF,6+ Mos 02/22/2019   Janssen (J&J) SARS-COV-2 Vaccination 09/18/2019   PFIZER(Purple Top)SARS-COV-2 Vaccination 05/08/2020   Tdap 12/18/2017, 02/22/2019     Objective: Vital Signs: There were no vitals taken for this visit.   Physical Exam Vitals and nursing note reviewed.  Constitutional:      Appearance: She is well-developed.  HENT:     Head: Normocephalic and atraumatic.  Eyes:     Conjunctiva/sclera: Conjunctivae normal.  Pulmonary:     Effort: Pulmonary effort is normal.  Abdominal:     Palpations: Abdomen is soft.  Musculoskeletal:     Cervical back: Normal range of motion.  Skin:    General: Skin is warm and dry.     Capillary Refill: Capillary refill takes less than 2 seconds.  Neurological:     Mental Status: She is alert and oriented to person, place, and time.  Psychiatric:        Behavior: Behavior normal.     Musculoskeletal Exam: ***  CDAI Exam: CDAI Score: -- Patient Global: --; Provider Global: -- Swollen: --; Tender: -- Joint Exam 04/30/2021   No joint exam has been documented for this visit   There is currently no information documented on the homunculus. Go to the Rheumatology activity and complete the homunculus joint exam.  Investigation: No additional  findings.  Imaging: No results found.  Recent Labs: Lab Results  Component Value Date   WBC 8.0 12/09/2020   HGB 12.5 12/09/2020   PLT 329 12/09/2020   NA 136 12/09/2020   K 3.6 12/09/2020   CL 104 12/09/2020   CO2 23 12/09/2020   GLUCOSE 101 (H) 12/09/2020   BUN 9 12/09/2020   CREATININE 0.49 12/09/2020   BILITOT 0.7 12/09/2020   ALKPHOS 44 12/09/2020   AST 15 12/09/2020   ALT 14 12/09/2020   PROT 7.9 12/09/2020   ALBUMIN 4.3 12/09/2020    CALCIUM 9.2 12/09/2020   GFRAA 144 10/25/2020   QFTBGOLDPLUS NEGATIVE 04/17/2020    Speciality Comments: No specialty comments available.  Procedures:  No procedures performed Allergies: Patient has no known allergies.   Assessment / Plan:     Visit Diagnoses: Ankylosing spondylitis of lumbosacral region [facility])  High risk medication use - Cimzia 200 mg sq injections every 2 weeks.  Sacroiliitis (HCC)  Effusion, left knee  Dry eyes  Hair loss  Uterine adenomyoma  Miscarriage  Language barrier  Orders: No orders of the defined types were placed in this encounter.  No orders of the defined types were placed in this encounter.    Follow-Up Instructions: No follow-ups on file.   Kathleen Neas, PA-C  Note - This record has been created using Dragon software.  Chart creation errors have been sought, but may not always  have been located. Such creation errors do not reflect on  the standard of medical care.

## 2021-04-19 ENCOUNTER — Other Ambulatory Visit: Payer: Self-pay | Admitting: Physician Assistant

## 2021-04-19 DIAGNOSIS — M457 Ankylosing spondylitis of lumbosacral region: Secondary | ICD-10-CM

## 2021-04-19 NOTE — Telephone Encounter (Signed)
Next Visit: 04/30/2021  Last Visit: 01/29/2021  Last Fill: 03/05/2021  DX:Ankylosing spondylitis of lumbosacral region   Current Dose per office note 01/29/2021: Cimzia 200 mg sq injections every 2 weeks.   Labs: 12/09/2020 Glucose 101  TB Gold: 04/17/2020 Neg    Patient to update labs at upcoming appointment on 04/30/2021.   Okay to refill Cimzia?

## 2021-04-30 ENCOUNTER — Ambulatory Visit: Payer: 59 | Admitting: Physician Assistant

## 2021-04-30 DIAGNOSIS — Z79899 Other long term (current) drug therapy: Secondary | ICD-10-CM

## 2021-04-30 DIAGNOSIS — M25462 Effusion, left knee: Secondary | ICD-10-CM

## 2021-04-30 DIAGNOSIS — M461 Sacroiliitis, not elsewhere classified: Secondary | ICD-10-CM

## 2021-04-30 DIAGNOSIS — Z789 Other specified health status: Secondary | ICD-10-CM

## 2021-04-30 DIAGNOSIS — O039 Complete or unspecified spontaneous abortion without complication: Secondary | ICD-10-CM

## 2021-04-30 DIAGNOSIS — D269 Other benign neoplasm of uterus, unspecified: Secondary | ICD-10-CM

## 2021-04-30 DIAGNOSIS — M457 Ankylosing spondylitis of lumbosacral region: Secondary | ICD-10-CM

## 2021-04-30 DIAGNOSIS — H04123 Dry eye syndrome of bilateral lacrimal glands: Secondary | ICD-10-CM

## 2021-04-30 DIAGNOSIS — L659 Nonscarring hair loss, unspecified: Secondary | ICD-10-CM

## 2021-06-21 ENCOUNTER — Other Ambulatory Visit: Payer: Self-pay | Admitting: Rheumatology

## 2021-06-21 DIAGNOSIS — M457 Ankylosing spondylitis of lumbosacral region: Secondary | ICD-10-CM

## 2021-06-28 ENCOUNTER — Other Ambulatory Visit: Payer: Self-pay | Admitting: Rheumatology

## 2021-06-28 ENCOUNTER — Other Ambulatory Visit: Payer: Self-pay | Admitting: *Deleted

## 2021-06-28 DIAGNOSIS — Z79899 Other long term (current) drug therapy: Secondary | ICD-10-CM

## 2021-06-28 DIAGNOSIS — M457 Ankylosing spondylitis of lumbosacral region: Secondary | ICD-10-CM

## 2021-06-28 NOTE — Telephone Encounter (Signed)
[place] left a voicemail in regards to [facility]. Pharmacy requests a refill of Cimzia 200mg /ml prefilled syringe. Phone #[phone] Fax [phone]

## 2021-06-29 ENCOUNTER — Other Ambulatory Visit: Payer: Self-pay | Admitting: *Deleted

## 2021-06-29 DIAGNOSIS — Z79899 Other long term (current) drug therapy: Secondary | ICD-10-CM

## 2021-06-29 MED ORDER — CIMZIA PREFILLED 2 X 200 MG/ML ~~LOC~~ PSKT: 200.0000 mg | PREFILLED_SYRINGE | SUBCUTANEOUS | 0 refills | Status: DC

## 2021-06-29 NOTE — Telephone Encounter (Signed)
Next Visit: Due November 2022. Message sent to the front to schedule patient   Last Visit: 01/29/2021  Last Fill: 04/19/2021 (30 day supply)  QI:[id number] spondylitis of lumbosacral region   Current Dose per office note 01/29/2021: Cimzia 200 mg sq injections every 2 weeks  Labs: 06/28/2021 WBC 3.0, Neutro Abs 705, Eosinophils Absolute 0, AST 36, ALT 37  TB Gold: 04/17/2020 Neg (updated 06/28/2021, results pending)   Okay to refill Cimzia?

## 2021-06-29 NOTE — Progress Notes (Signed)
AST and ALT are borderline elevated. Please clarify if she has been taking any tylenol or alcohol use.  Please clarify if she has been taking norco which has acetaminophen in it.   WBC count is low-3.0.  absolute neutrophils and absolute eosinophils are low.   Please clarify when her last dose of cimzia was.   Recheck CBC with diff and hepatic function panel in 1 month.

## 2021-07-01 LAB — COMPLETE METABOLIC PANEL WITH GFR
AG Ratio: 1.4 (calc) (ref 1.0–2.5)
ALT: 37 U/L — ABNORMAL HIGH (ref 6–29)
AST: 36 U/L — ABNORMAL HIGH (ref 10–30)
Albumin: 4.4 g/dL (ref 3.6–5.1)
Alkaline phosphatase (APISO): 34 U/L (ref 31–125)
BUN: 8 mg/dL (ref 7–25)
CO2: 29 mmol/L (ref 20–32)
Calcium: 8.9 mg/dL (ref 8.6–10.2)
Chloride: 103 mmol/L (ref 98–110)
Creat: 0.53 mg/dL (ref 0.50–0.97)
Globulin: 3.2 g/dL (calc) (ref 1.9–3.7)
Glucose, Bld: 92 mg/dL (ref 65–99)
Potassium: 4.1 mmol/L (ref 3.5–5.3)
Sodium: 139 mmol/L (ref 135–146)
Total Bilirubin: 0.5 mg/dL (ref 0.2–1.2)
Total Protein: 7.6 g/dL (ref 6.1–8.1)
eGFR: 124 mL/min/{1.73_m2} (ref >=60)

## 2021-07-01 LAB — CBC WITH DIFFERENTIAL/PLATELET
Absolute Monocytes: 243 cells/uL (ref 200–950)
Basophils Absolute: 9 cells/uL (ref 0–200)
Basophils Relative: 0.3 %
Eosinophils Absolute: 0 cells/uL — ABNORMAL LOW (ref 15–500)
Eosinophils Relative: 0 %
HCT: 37.5 % (ref 35.0–45.0)
Hemoglobin: 12.5 g/dL (ref 11.7–15.5)
Lymphs Abs: 2043 cells/uL (ref 850–3900)
MCH: 28.6 pg (ref 27.0–33.0)
MCHC: 33.3 g/dL (ref 32.0–36.0)
MCV: 85.8 fL (ref 80.0–100.0)
MPV: 9.9 fL (ref 7.5–12.5)
Monocytes Relative: 8.1 %
Neutro Abs: 705 cells/uL — ABNORMAL LOW (ref 1500–7800)
Neutrophils Relative %: 23.5 %
Platelets: 277 10*3/uL (ref 140–400)
RBC: 4.37 10*6/uL (ref 3.80–5.10)
RDW: 12.6 % (ref 11.0–15.0)
Total Lymphocyte: 68.1 %
WBC: 3 10*3/uL — ABNORMAL LOW (ref 3.8–10.8)

## 2021-07-01 LAB — QUANTIFERON-TB GOLD PLUS
Mitogen-NIL: >10 IU/mL
NIL: 0.05 IU/mL
QuantiFERON-TB Gold Plus: NEGATIVE
TB1-NIL: 0 IU/mL
TB2-NIL: 0 IU/mL

## 2021-07-01 NOTE — Progress Notes (Signed)
TB Gold is negative.

## 2021-07-30 ENCOUNTER — Emergency Department (HOSPITAL_COMMUNITY): Payer: 59

## 2021-07-30 ENCOUNTER — Emergency Department (HOSPITAL_COMMUNITY)
Admission: EM | Admit: 2021-07-30 | Discharge: 2021-07-30 | Disposition: A | Discharge status: Home / Self Care | Payer: 59 | Attending: Emergency Medicine | Admitting: Emergency Medicine

## 2021-07-30 ENCOUNTER — Other Ambulatory Visit: Payer: Self-pay

## 2021-07-30 ENCOUNTER — Encounter (HOSPITAL_COMMUNITY): Payer: Self-pay

## 2021-07-30 DIAGNOSIS — N9489 Other specified conditions associated with female genital organs and menstrual cycle: Secondary | ICD-10-CM | POA: Insufficient documentation

## 2021-07-30 DIAGNOSIS — R1031 Right lower quadrant pain: Secondary | ICD-10-CM | POA: Insufficient documentation

## 2021-07-30 LAB — LIPASE, BLOOD: Lipase: 36 U/L (ref 11–51)

## 2021-07-30 LAB — COMPREHENSIVE METABOLIC PANEL
ALT: 14 U/L (ref 0–44)
AST: 14 U/L — ABNORMAL LOW (ref 15–41)
Albumin: 4.2 g/dL (ref 3.5–5.0)
Alkaline Phosphatase: 39 U/L (ref 38–126)
Anion gap: 11 (ref 5–15)
BUN: 7 mg/dL (ref 6–20)
CO2: 22 mmol/L (ref 22–32)
Calcium: 9.1 mg/dL (ref 8.9–10.3)
Chloride: 100 mmol/L (ref 98–111)
Creatinine, Ser: 0.48 mg/dL (ref 0.44–1.00)
GFR, Estimated: >60 mL/min (ref >60)
Glucose, Bld: 106 mg/dL — ABNORMAL HIGH (ref 70–99)
Potassium: 3.4 mmol/L — ABNORMAL LOW (ref 3.5–5.1)
Sodium: 133 mmol/L — ABNORMAL LOW (ref 135–145)
Total Bilirubin: 0.4 mg/dL (ref 0.3–1.2)
Total Protein: 8.1 g/dL (ref 6.5–8.1)

## 2021-07-30 LAB — CBC
HCT: 38.3 % (ref 36.0–46.0)
Hemoglobin: 12.2 g/dL (ref 12.0–15.0)
MCH: 27.7 pg (ref 26.0–34.0)
MCHC: 31.9 g/dL (ref 30.0–36.0)
MCV: 87 fL (ref 80.0–100.0)
Platelets: 331 10*3/uL (ref 150–400)
RBC: 4.4 MIL/uL (ref 3.87–5.11)
RDW: 13.1 % (ref 11.5–15.5)
WBC: 5.9 10*3/uL (ref 4.0–10.5)
nRBC: 0 % (ref 0.0–0.2)

## 2021-07-30 LAB — URINALYSIS, ROUTINE W REFLEX MICROSCOPIC
Bacteria, UA: NONE SEEN
Bilirubin Urine: NEGATIVE
Glucose, UA: NEGATIVE mg/dL
Ketones, ur: 5 mg/dL — AB
Leukocytes,Ua: NEGATIVE
Nitrite: NEGATIVE
Protein, ur: NEGATIVE mg/dL
Specific Gravity, Urine: 1.02 (ref 1.005–1.030)
pH: 6 (ref 5.0–8.0)

## 2021-07-30 LAB — I-STAT BETA HCG BLOOD, ED (MC, WL, AP ONLY): I-stat hCG, quantitative: <5 m[IU]/mL (ref <5)

## 2021-07-30 MED ORDER — ONDANSETRON 4 MG PO TBDP
4.0000 mg | ORAL_TABLET | Freq: Once | ORAL | Status: AC
Start: 2021-07-30 — End: 2021-07-30
  Administered 2021-07-30: 4 mg via ORAL
  Filled 2021-07-30: qty 1

## 2021-07-30 MED ORDER — KETOROLAC TROMETHAMINE 60 MG/2ML IM SOLN
30.0000 mg | Freq: Once | INTRAMUSCULAR | Status: AC
  Administered 2021-07-30: 30 mg via INTRAMUSCULAR
  Filled 2021-07-30: qty 2

## 2021-07-30 NOTE — ED Provider Triage Note (Signed)
Emergency Medicine Provider Triage Evaluation Note  Kathleen Nguyen , a 36 y.o. female  was evaluated in triage.  Pt complains of right lower quadrant pain.  Patient has history of uterine fibroids, and sees rheumatologist.  She states her pain has been going on for 2 days.  She is currently on her menstrual cycle, vaginal bleeding for the past 3 days.  She reports nausea, no vomiting,.  She is unsure of pregnancy status, has not taken a test.  Review of Systems  Positive: Right lower quadrant pain, vaginal bleeding, nausea Negative: Fever, chills, vomiting  Physical Exam  BP 127/88 (BP Location: Left Arm)    Pulse 83    Temp 97.7 F (36.5 C) (Oral)    Resp 20    Ht 5\' 1"  (1.549 m)    Wt 85 kg    SpO2 100%    BMI 35.41 kg/m  Gen:   Awake, no distress   Resp:  Normal effort  MSK:   Moves extremities without difficulty  Other:  Tearful in triage  Medical Decision Making  Medically screening exam initiated at 11:41 AM.  Appropriate orders placed.  [name] was informed that the remainder of the evaluation will be completed by another provider, this initial triage assessment does not replace that evaluation, and the importance of remaining in the ED until their evaluation is complete.  Interpreter used during interview Patient states that her rheumatologist has told her to "obtain testing before getting any medicine for this". Unsure as to what medicine she is talking about.    [name] T, PA-C 07/30/21 1142

## 2021-07-30 NOTE — ED Triage Notes (Signed)
Pt arrives POV for eval of RLQ abd pain x 2 days. Pt also reports vaginal bleeding x 3 days, is currently on cycle. Pt reports nausea, no vomiting. Unsure of pregnancy status, has not taken a test.

## 2021-07-30 NOTE — ED Notes (Signed)
Patient verbalizes understanding of d/c instructions. Opportunities for questions and answers were provided. Pt d/c from ED and ambulated to lobby with husband.  

## 2021-07-30 NOTE — Discharge Instructions (Signed)
You were evaluated in our emergency department for pelvic pain.  Your ultrasound did not reveal evidence of ovarian torsion.  It does appear that you do have a uterine fibroid which you mentioned.  Your liver enzymes are lower than they were in your previous outpatient appointment.  I recommend that you continue to take Tylenol for your pain.  Please take 500 mg every 6 hours.

## 2021-07-30 NOTE — ED Notes (Signed)
Patient transported to Ultrasound 

## 2021-07-30 NOTE — ED Provider Notes (Signed)
[facility] Emergency Department Provider Note MRN:  [id number]  Arrival date & time: 07/30/21     Chief Complaint   Abdominal Pain and Vaginal Bleeding   History of Present Illness   Kathleen Nguyen is a 36 y.o. year-old female with a history of Anemia and Uterine fibroid presenting to the ED with chief complaint of abdominal pain.  This is a 36 year old female who presents to our emergency department for evaluation of abdominal pain.  Patient states that her abdominal pain has been ongoing for the past 3 days.  3 days ago the patient started her menstrual cycle.  The patient states that this pain however has been worse than her usual menstrual cycle pain.  The patient states that her pain over the past 5 hours has been severe, colicky, and located to the right lower quadrant.  There is no radiation of her pain.  She states that she has had no dysuria or vaginal discharge.  Denies trauma, chest pain, shortness of breath, leg swelling, vomiting, or diarrhea.  The patient does report that she recently discontinued her oral contraceptives.  She also reports that she has not attempted to treat her pain with over-the-counter analgesics.  History and physical was done using the hospital tele-interpreter.  Review of Systems  A thorough review of systems was obtained and all systems are negative except as noted in the HPI and PMH.   Patient's Health History    Past Medical History:  Diagnosis Date   Anemia    Ankylosing spondylitis ([place])    Ankylosing spondylitis ([place])    Rheumatoid arthritis ([place])     Past Surgical History:  Procedure Laterality Date   CESAREAN SECTION     CESAREAN SECTION     CESAREAN SECTION N/A 05/03/2019   Procedure: CESAREAN SECTION;  Surgeon: Woodroe Mode, MD;  Location: [facility] LD [facility];  Service: Obstetrics;  Laterality: N/A;    Family History  Problem Relation Age of Onset   Arthritis/Rheumatoid Mother     Social History    Socioeconomic History   Marital status: Married    Spouse name: Not on file   Number of children: 3   Years of education: College   Highest education level: Bachelor's degree (e.g., BA, AB, BS)  Occupational History   Occupation: Unemployed  Tobacco Use   Smoking status: Never   Smokeless tobacco: Never  Vaping Use   Vaping Use: Never used  Substance and Sexual Activity   Alcohol use: No   Drug use: No   Sexual activity: Yes    Birth control/protection: Pill  Other Topics Concern   Not on file  Social History Narrative   Not on file   Social Determinants of Health   Financial Resource Strain: Not on file  Food Insecurity: No Food Insecurity   Worried About Running Out of Food in the Last Year: Never true   Wells in the Last Year: Never true  Transportation Needs: No Transportation Needs   Lack of Transportation (Medical): No   Lack of Transportation (Non-Medical): No  Physical Activity: Not on file  Stress: Not on file  Social Connections: Not on file  Intimate Partner Violence: Not on file     Physical Exam   Physical Exam Constitutional:      Appearance: She is well-developed. She is not ill-appearing.  HENT:     Head: Normocephalic and atraumatic.     Right Ear: External ear normal.  Left Ear: External ear normal.     Nose: Nose normal.  Cardiovascular:     Rate and Rhythm: Normal rate and regular rhythm.     Pulses: Normal pulses.     Heart sounds: Normal heart sounds.  Pulmonary:     Effort: Pulmonary effort is normal.     Breath sounds: Normal breath sounds.  Abdominal:     General: Abdomen is flat. Bowel sounds are normal.     Palpations: Abdomen is soft.     Tenderness: There is abdominal tenderness in the right lower quadrant. There is no right CVA tenderness, left CVA tenderness, guarding or rebound.  Musculoskeletal:     Cervical back: Neck supple.  Skin:    General: Skin is warm and dry.  Neurological:     General: No focal  deficit present.     Mental Status: She is alert and oriented to person, place, and time.      Diagnostic and Interventional Summary    Labs Reviewed  COMPREHENSIVE METABOLIC PANEL - Abnormal; Notable for the following components:      Result Value   Sodium 133 (*)    Potassium 3.4 (*)    Glucose, Bld 106 (*)    AST 14 (*)    All other components within normal limits  URINALYSIS, ROUTINE W REFLEX MICROSCOPIC - Abnormal; Notable for the following components:   APPearance HAZY (*)    Hgb urine dipstick LARGE (*)    Ketones, ur 5 (*)    All other components within normal limits  LIPASE, BLOOD  CBC  I-STAT BETA HCG BLOOD, ED ([facility], [facility], [facility] ONLY)    US PELVIC COMPLETE W TRANSVAGINAL AND TORSION R/O  Final Result      Medications  ondansetron (ZOFRAN-ODT) disintegrating tablet 4 mg (4 mg Oral Given 07/30/21 1545)  ketorolac (TORADOL) injection 30 mg (30 mg Intramuscular Given 07/30/21 1545)     Procedures  /  Critical Care Procedures  ED Course and Medical Decision Making  Initial Impression and Ddx This is a 36 year old who presents to the emergency department for evaluation of right lower quadrant abdominal pain.  Differential diagnosis includes but is not limited to the following: Menorrhagia, ovarian torsion, appendicitis, cystitis, pyelonephritis.  I have low suspicion for appendicitis at this time given that the pain onset was consistent with the onset of her menstrual cycle.  Higher concern for possible ovarian torsion versus menorrhagia.  Will obtain labs and imaging to further evaluate.  Past medical/surgical history that increases complexity of ED encounter: Uterine fibroid  Interpretation of Diagnostics I personally reviewed the Cardiac Monitor and my interpretation is as follows: Normal sinus rhythm on the cardiac monitor.    Beta-hCG less than 5.  CMP unremarkable.  CBC without leukocytosis, or significant anemia.  Urine not remarkable for signs of  infection. Ultrasound obtained to evaluate for torsion was negative for torsion however it did show that patient had a uterine fibroid which was known to the patient.  Patient was provided with antiemetics for nausea, and provided with a Toradol injection.  Patient Reassessment and Ultimate Disposition/Management Given that the patient and normal LFTs now I recommended that she continue taking Tylenol and follow-up with her OB/GYN.  Patient is agreement this plan.  Patient was discharged home self-care.  Patient management required discussion with the following services or consulting groups:  None  Complexity of Problems Addressed Acute illness or injury that poses threat of life of bodily function  Additional Data Reviewed  and Analyzed Further history obtained from: Recent PCP notes and Recent Consult notes   Factors Impacting ED Encounter Risk Consideration of hospitalization    Final Clinical Impressions(s) / ED Diagnoses     ICD-10-CM   1. RLQ abdominal pain  R10.31 US PELVIC COMPLETE W TRANSVAGINAL AND TORSION R/O    US PELVIC COMPLETE W TRANSVAGINAL AND TORSION R/O      ED Discharge Orders     None        Discharge Instructions Discussed with and Provided to Patient:     Discharge Instructions      You were evaluated in our emergency department for pelvic pain.  Your ultrasound did not reveal evidence of ovarian torsion.  It does appear that you do have a uterine fibroid which you mentioned.  Your liver enzymes are lower than they were in your previous outpatient appointment.  I recommend that you continue to take Tylenol for your pain.  Please take 500 mg every 6 hours.        Zachery Dakins, MD 07/30/21 Arnoldo Lenis    Elnora Morrison, MD 08/02/21 [phone]

## 2021-07-31 ENCOUNTER — Telehealth: Payer: Self-pay | Admitting: *Deleted

## 2021-07-31 NOTE — Telephone Encounter (Signed)
Transition Care Management Unsuccessful Follow-up Telephone Call  Date of discharge and from where:  07/30/2021 - [facility]  Attempts:  1st Attempt  Reason for unsuccessful TCM follow-up call:  Unable to leave message  Call was attempted with language interpreter service

## 2021-08-01 NOTE — Telephone Encounter (Signed)
Transition Care Management Unsuccessful Follow-up Telephone Call  Date of discharge and from where:  07/30/2021 - [facility]  Attempts:  2nd Attempt  Reason for unsuccessful TCM follow-up call:  Unable to leave message  Call was attempted with language interpreter service

## 2021-08-02 ENCOUNTER — Telehealth: Payer: Self-pay | Admitting: Family Medicine

## 2021-08-02 NOTE — Telephone Encounter (Signed)
Transition Care Management Unsuccessful Follow-up Telephone Call  Date of discharge and from where:  07/30/2021 from [facility]  Attempts:  3rd Attempt  Reason for unsuccessful TCM follow-up call:  Unable to reach patient

## 2021-08-02 NOTE — Telephone Encounter (Signed)
Patient is requesting for a nurse to give her a call back please.

## 2021-08-03 NOTE — Telephone Encounter (Signed)
Called pt with Arabic interpreter # [phone] pt requests an injection for her periods.  I explained to pt that she would need to speak with a provider about management.   Pt states that she was told that there was no appt until April and she can not wait that long.  Per chart review, pt was prescribed BCP's in August 2022.  Pt reports that she stopped taking them about a month ago.  I encouraged pt to restart the BCP as it looks as if she did not issues until she stopped taking the BCP.  Pt stated that she would restart the BCP.  I asked pt if she has sex.  She reports yes however she is currently on her period.  I advised pt that she can restart taking the BCP on Sunday.  I advised pt to begin the pill until her appt in March in which then she will be able to discuss management.  Pt verbalized understanding with no further questions.   Mel Almond, RN  08/03/21

## 2021-08-21 ENCOUNTER — Encounter: Payer: Self-pay | Admitting: *Deleted

## 2021-08-21 NOTE — Congregational Nurse Program (Signed)
°  Dept: [phone]   Congregational Nurse Program Note  Date of Encounter: 08/21/2021  Past Medical History: Past Medical History:  Diagnosis Date   Anemia    Ankylosing spondylitis ([place])    Ankylosing spondylitis ([place])    Rheumatoid arthritis ([place])     Encounter Details:  CNP Questionnaire - 08/21/21 1354       Questionnaire   Do you give verbal consent to treat you today? Yes    Location Patient [place] or Organization    Patient Status Immigrant    Insurance [employer] Referral N/A    Medication N/A    Medical Provider Yes    Screening Referrals N/A    Medical Referral N/A    Medical Appointment Made N/A    Food N/A    Transportation N/A    Housing/Utilities N/A    Interpersonal Safety N/A    Intervention Blood pressure;Advocate;Educate;Support    ED Visit Averted Yes    Life-Saving Intervention Made N/A            Client came into clinic today for BP check and weight check.  Client would like to lose 10 kg.  Discussed needed dietary changes and increase in exercise with client.  Client is also interested in courses to help her professionally such as nutrition, pharmacy, beauty.  This CN will assist with investigation and then share options with client.  Client to follow up in the next couple weeks with this CN.  Karene Fry, RN, MSN, [place] [facility] [phone] Cell

## 2021-08-30 ENCOUNTER — Encounter: Payer: Self-pay | Admitting: Physician Assistant

## 2021-08-30 ENCOUNTER — Telehealth: Payer: Self-pay

## 2021-08-30 ENCOUNTER — Other Ambulatory Visit: Payer: Self-pay

## 2021-08-30 ENCOUNTER — Ambulatory Visit (INDEPENDENT_AMBULATORY_CARE_PROVIDER_SITE_OTHER): Payer: 59 | Admitting: Physician Assistant

## 2021-08-30 DIAGNOSIS — L810 Postinflammatory hyperpigmentation: Secondary | ICD-10-CM | POA: Diagnosis not present

## 2021-08-30 DIAGNOSIS — L400 Psoriasis vulgaris: Secondary | ICD-10-CM

## 2021-08-30 DIAGNOSIS — L639 Alopecia areata, unspecified: Secondary | ICD-10-CM

## 2021-08-30 MED ORDER — TRIAMCINOLONE ACETONIDE 0.1 % EX CREA: 1.0000 "application " | TOPICAL_CREAM | Freq: Two times a day (BID) | CUTANEOUS | 7 refills | Status: AC | PRN

## 2021-08-30 MED ORDER — BETAMETHASONE DIPROPIONATE 0.05 % EX OINT: TOPICAL_OINTMENT | Freq: Two times a day (BID) | CUTANEOUS | 3 refills | Status: DC | PRN

## 2021-08-30 NOTE — Telephone Encounter (Signed)
Fax received from Pharmacy needing a prior authorization for the betamethasone dip ointment.  ?

## 2021-08-30 NOTE — Progress Notes (Signed)
? ?  New Patient ?  ?Subjective  ?Kathleen Nguyen is a 36 y.o. female who presents for the following: Rash (Face hands feet and neck dark spots only face and neck itch x months. Tx otc lotions and creams. Previous derm dx psoriasis rx cimzia and betamethasone oint she said she didn't use the ointment she d/c the injection treatment in January.). ? ? ?The following portions of the chart were reviewed this encounter and updated as appropriate:  Tobacco  Allergies  Meds  Problems  Med Hx  Surg Hx  Fam Hx   ?  ? ?Objective  ?Well appearing patient in no apparent distress; mood and affect are within normal limits. ? ?A full examination was performed including scalp, head, eyes, ears, nose, lips, neck, chest, axillae, abdomen, back, buttocks, bilateral upper extremities, bilateral lower extremities, hands, feet, fingers, toes, fingernails, and toenails. All findings within normal limits unless otherwise noted below. ? ?Diagnosis was initially psoriasis. No evidence of psoriasis.  ? ?hands, feet, knees. ?No evidence of psoriasis today. She has numerous areas of post inflammatory hyperpigmentation. ? ? ?Assessment & Plan  ?Alopecia areata ? ?Postinflammatory hyperpigmentation ? ?triamcinolone cream (KENALOG) 0.1 % ?Apply 1 application. topically 2 (two) times daily as needed. ? ?Psoriasis vulgaris ?hands, feet, knees. ? ?triamcinolone cream (KENALOG) 0.1 % - hands, feet, knees. ?Apply 1 application. topically 2 (two) times daily as needed. ? ? ? ? ?I, Ritika Hellickson, PA-C, have reviewed all documentation's for this visit.  The documentation on 08/30/21 for the exam, diagnosis, procedures and orders are all accurate and complete. ?

## 2021-08-30 NOTE — Telephone Encounter (Signed)
Phone call to the pharmacy to change the prescription. Per Pharmacist the patient has already picked up the betamethasone dip ointment. ?

## 2021-09-12 ENCOUNTER — Ambulatory Visit: Payer: Medicaid Other | Admitting: Obstetrics and Gynecology

## 2021-09-12 ENCOUNTER — Telehealth: Payer: Self-pay | Admitting: Lactation Services

## 2021-09-12 NOTE — Telephone Encounter (Signed)
Patient called and LM on Lactation line that she is not able to make her appointment this morning and would like a call back.  ? ?Called patient back with assistance of [employer] [place]. She has a fever, HA and sore throat and is not feeling well. Advised to call PCP or an Urgent Care if care needed for today's symptoms.  ? ?Reviewed she will need to reschedule her appointment and will message front office to call to reschedule. Patient voiced understanding.  ? ?Patient with no other questions or concerns at this time.  ?

## 2021-11-14 ENCOUNTER — Ambulatory Visit (INDEPENDENT_AMBULATORY_CARE_PROVIDER_SITE_OTHER): Payer: 59 | Admitting: Family Medicine

## 2021-11-14 ENCOUNTER — Encounter: Payer: Self-pay | Admitting: Family Medicine

## 2021-11-14 VITALS — BP 114/79 | HR 91 | Wt 183.0 lb

## 2021-11-14 DIAGNOSIS — D269 Other benign neoplasm of uterus, unspecified: Secondary | ICD-10-CM

## 2021-11-14 DIAGNOSIS — Z789 Other specified health status: Secondary | ICD-10-CM | POA: Diagnosis not present

## 2021-11-14 DIAGNOSIS — N939 Abnormal uterine and vaginal bleeding, unspecified: Secondary | ICD-10-CM

## 2021-11-14 DIAGNOSIS — Z758 Other problems related to medical facilities and other health care: Secondary | ICD-10-CM

## 2021-11-14 MED ORDER — DICLOFENAC SODIUM 75 MG PO TBEC: 75.0000 mg | DELAYED_RELEASE_TABLET | Freq: Two times a day (BID) | ORAL | 1 refills | Status: DC

## 2021-11-14 NOTE — Progress Notes (Signed)
Patient reports "severe pain" during her periods. She informed me that she went to the ED due to the pain. \\  Today she is not having pain or any bleeding.

## 2021-11-14 NOTE — Assessment & Plan Note (Signed)
Ongoing pain with multiple ED visits, desires surgical management. In meantime urged her to run OCP's together for pain control. Images reviewed. Given 3 prior cesareans and location of fibroid may not be a good surgical candidate, but perhaps [place] or [place] would be, especially since she still desires pregnancy in the future. Will refer to Dr. Elgie Congo for consultation.

## 2021-11-14 NOTE — Assessment & Plan Note (Signed)
Interpreter Enayat present throughout encounter

## 2021-11-14 NOTE — Progress Notes (Signed)
GYNECOLOGY OFFICE VISIT NOTE  History:   Kathleen Nguyen is a 36 y.o. [phone] here today for painful periods and fibroids.  Last seen by me in 01/2021, at that time had AUB with painful periods Previously interested in hormonal IUD but declined, trial of OCP's Seen in [facility] on 03/30/2021 for same symptoms, sent celecoxib and norco Seen in [place] on 07/30/2021 for pain, had [place] that showed fibroid but otherwise unremarkable, discharged home, no new meds Also went to [facility] on 07/31/2021 for same and was given toradol injection, diclofenac tablets, and norco [place] [facility]30/2021 in [facility] reviewed, 3.1cm R intramural mass, see report below  Today reports ongoing severe pain from her fibroids Would like to discuss surgery to deal with this She has not yet tried running packs together, though recently started taking pills and this stopped her period  Health Maintenance Due  Topic Date Due   COVID-19 Vaccine (3 - Booster for [employer] series) 07/03/2020    Past Medical History:  Diagnosis Date   Anemia    Ankylosing spondylitis ([place])    Ankylosing spondylitis ([place])    Rheumatoid arthritis ([place])     Past Surgical History:  Procedure Laterality Date   CESAREAN SECTION     CESAREAN SECTION     CESAREAN SECTION N/A 05/03/2019   Procedure: CESAREAN SECTION;  Surgeon: Woodroe Mode, MD;  Location: [facility] LD [facility];  Service: Obstetrics;  Laterality: N/A;    The following portions of the patient's history were reviewed and updated as appropriate: allergies, current medications, past family history, past medical history, past social history, past surgical history and problem list.   Health Maintenance:   Last pap: Lab Results  Component Value Date   DIAGPAP  02/02/2021    - Negative for intraepithelial lesion or malignancy (NILM)   HPV NOT DETECTED 01/13/2017   [place] Negative 02/02/2021     Last mammogram:  N/a    Review of Systems:  Pertinent items noted in HPI and remainder of  comprehensive ROS otherwise negative.  Physical Exam:  BP 114/79   Pulse 91   Wt 183 lb (83 kg)   LMP 09/29/2021 (Exact Date)   BMI 34.58 kg/m  CONSTITUTIONAL: Well-developed, well-nourished female in no acute distress.  HEENT:  Normocephalic, atraumatic. External right and left ear normal. No scleral icterus.  NECK: Normal range of motion, supple, no masses noted on observation SKIN: No rash noted. Not diaphoretic. No erythema. No pallor. MUSCULOSKELETAL: Normal range of motion. No edema noted. NEUROLOGIC: Alert and oriented to person, place, and time. Normal muscle tone coordination.  PSYCHIATRIC: Normal mood and affect. Normal behavior. Normal judgment and thought content. RESPIRATORY: Effort normal, no problems with respiration noted  Labs and Imaging No results found for this or any previous visit (from the past 168 hour(s)). No results found.     US PELVIC COMPLETE W TRANSVAGINAL AND TORSION R/O CLINICAL DATA:  RIGHT lower quadrant abdominal pain, R 10.31, LMP 07/27/2021  EXAM: TRANSABDOMINAL AND TRANSVAGINAL ULTRASOUND OF PELVIS  DOPPLER ULTRASOUND OF OVARIES  TECHNIQUE: Both transabdominal and transvaginal ultrasound examinations of the pelvis were performed. Transabdominal technique was performed for global imaging of the pelvis including uterus, ovaries, adnexal regions, and pelvic cul-de-sac.  It was necessary to proceed with endovaginal exam following the transabdominal exam to visualize the endometrium, and to characterize a uterine mass. Color and duplex Doppler ultrasound was utilized to evaluate blood flow to the ovaries.  COMPARISON:  12/09/2020, 10/08/2016  FINDINGS: Uterus  Measurements: 11.0 x 4.3 x 5.1 cm = volume: 130 mL. Anteverted. Normal myometrial echogenicity. Mass identified intramural at RIGHT upper uterus, 3.1 x 1.9 x 2.3 cm, containing a hyperechoic rim and an isoechoic homogeneous central component. This corresponds in size and  position with a similar mass seen on the prior study of 12/09/2020. On an earlier study from 2019, this was a predominantly hyperechoic mass with several small hypoechoic internal foci suggesting lipoleiomyoma or adenomyoma.  Endometrium  Thickness: 8 mm.  No endometrial fluid or mass  Right ovary  Measurements: 3.6 x 2.0 x 2.5 cm = volume: 9.8 mL. Normal morphology without mass  Left ovary  Measurements: 4.7 x 1.6 x 2.5 cm = volume: 9.7 mL. Normal morphology without mass  Pulsed Doppler evaluation of both ovaries demonstrates low resistance arterial and venous waveforms in both ovaries.  Other findings  No free pelvic fluid.  No adnexal masses.  IMPRESSION: Again identified 3.1 cm diameter RIGHT upper uterine intramural mass, question leiomyoma, lipoleiomyoma or adenomyomatosis, demonstrating little changed since previous study and decreased internal echogenicity since 2018 question degeneration.  No other pelvic sonographic abnormalities.  Electronically Signed   By: Lavonia Dana M.D.   On: 07/30/2021 17:28  Assessment and Plan:   Problem List Items Addressed This Visit       Genitourinary   Uterine adenomyoma - Primary    Ongoing pain with multiple ED visits, desires surgical management. In meantime urged her to run OCP's together for pain control. Images reviewed. Given 3 prior cesareans and location of fibroid may not be a good surgical candidate, but perhaps [place] or [place] would be, especially since she still desires pregnancy in the future. Will refer to Dr. Elgie Congo for consultation.        Abnormal uterine bleeding (AUB)     Other   Language barrier    Interpreter Enayat present throughout encounter        Routine preventative health maintenance measures emphasized. Please refer to After Visit Summary for other counseling recommendations.   Return in about 4 weeks (around 12/12/2021) for discuss surgical options ([place], [facility], etc) for symptomatic  fibroid.    Total face-to-face time with patient: 30 minutes.  Over 50% of encounter was spent on counseling and coordination of care.   Clarnce Flock, MD/MPH Attending Family Medicine Physician, [facility] for [facility], [place]

## 2021-12-05 ENCOUNTER — Emergency Department (HOSPITAL_COMMUNITY): Payer: 59

## 2021-12-05 ENCOUNTER — Other Ambulatory Visit: Payer: Self-pay

## 2021-12-05 ENCOUNTER — Encounter (HOSPITAL_COMMUNITY): Payer: Self-pay

## 2021-12-05 ENCOUNTER — Emergency Department (HOSPITAL_COMMUNITY)
Admission: EM | Admit: 2021-12-05 | Discharge: 2021-12-05 | Disposition: A | Discharge status: Home / Self Care | Payer: 59 | Attending: Emergency Medicine | Admitting: Emergency Medicine

## 2021-12-05 DIAGNOSIS — N939 Abnormal uterine and vaginal bleeding, unspecified: Secondary | ICD-10-CM | POA: Insufficient documentation

## 2021-12-05 DIAGNOSIS — R109 Unspecified abdominal pain: Secondary | ICD-10-CM | POA: Diagnosis not present

## 2021-12-05 LAB — COMPREHENSIVE METABOLIC PANEL
ALT: 11 U/L (ref 0–44)
AST: 13 U/L — ABNORMAL LOW (ref 15–41)
Albumin: 3.7 g/dL (ref 3.5–5.0)
Alkaline Phosphatase: 29 U/L — ABNORMAL LOW (ref 38–126)
Anion gap: 6 (ref 5–15)
BUN: 5 mg/dL — ABNORMAL LOW (ref 6–20)
CO2: 25 mmol/L (ref 22–32)
Calcium: 8.7 mg/dL — ABNORMAL LOW (ref 8.9–10.3)
Chloride: 107 mmol/L (ref 98–111)
Creatinine, Ser: 0.52 mg/dL (ref 0.44–1.00)
GFR, Estimated: >60 mL/min (ref >60)
Glucose, Bld: 114 mg/dL — ABNORMAL HIGH (ref 70–99)
Potassium: 3.7 mmol/L (ref 3.5–5.1)
Sodium: 138 mmol/L (ref 135–145)
Total Bilirubin: 0.3 mg/dL (ref 0.3–1.2)
Total Protein: 7.2 g/dL (ref 6.5–8.1)

## 2021-12-05 LAB — CBC WITH DIFFERENTIAL/PLATELET
Abs Immature Granulocytes: 0.02 10*3/uL (ref 0.00–0.07)
Basophils Absolute: 0 10*3/uL (ref 0.0–0.1)
Basophils Relative: 1 %
Eosinophils Absolute: 0 10*3/uL (ref 0.0–0.5)
Eosinophils Relative: 0 %
HCT: 35.1 % — ABNORMAL LOW (ref 36.0–46.0)
Hemoglobin: 11.6 g/dL — ABNORMAL LOW (ref 12.0–15.0)
Immature Granulocytes: 0 %
Lymphocytes Relative: 23 %
Lymphs Abs: 1.4 10*3/uL (ref 0.7–4.0)
MCH: 29.1 pg (ref 26.0–34.0)
MCHC: 33 g/dL (ref 30.0–36.0)
MCV: 88.2 fL (ref 80.0–100.0)
Monocytes Absolute: 0.3 10*3/uL (ref 0.1–1.0)
Monocytes Relative: 5 %
Neutro Abs: 4.4 10*3/uL (ref 1.7–7.7)
Neutrophils Relative %: 71 %
Platelets: 326 10*3/uL (ref 150–400)
RBC: 3.98 MIL/uL (ref 3.87–5.11)
RDW: 12.7 % (ref 11.5–15.5)
WBC: 6.2 10*3/uL (ref 4.0–10.5)
nRBC: 0 % (ref 0.0–0.2)

## 2021-12-05 LAB — URINALYSIS, ROUTINE W REFLEX MICROSCOPIC
Bacteria, UA: NONE SEEN
Bilirubin Urine: NEGATIVE
Glucose, UA: NEGATIVE mg/dL
Ketones, ur: 5 mg/dL — AB
Leukocytes,Ua: NEGATIVE
Nitrite: NEGATIVE
Protein, ur: NEGATIVE mg/dL
Specific Gravity, Urine: 1.045 — ABNORMAL HIGH (ref 1.005–1.030)
pH: 7 (ref 5.0–8.0)

## 2021-12-05 LAB — LIPASE, BLOOD: Lipase: 30 U/L (ref 11–51)

## 2021-12-05 LAB — PREGNANCY, URINE: Preg Test, Ur: NEGATIVE

## 2021-12-05 MED ORDER — IOHEXOL 300 MG/ML  SOLN
100.0000 mL | Freq: Once | INTRAMUSCULAR | Status: AC | PRN
  Administered 2021-12-05: 100 mL via INTRAVENOUS

## 2021-12-05 MED ORDER — NAPROXEN 500 MG PO TABS: 500.0000 mg | ORAL_TABLET | Freq: Two times a day (BID) | ORAL | 0 refills | Status: DC

## 2021-12-05 MED ORDER — ONDANSETRON 4 MG PO TBDP
8.0000 mg | ORAL_TABLET | Freq: Once | ORAL | Status: AC
  Administered 2021-12-05: 8 mg via ORAL
  Filled 2021-12-05: qty 2

## 2021-12-05 MED ORDER — IBUPROFEN 800 MG PO TABS
800.0000 mg | ORAL_TABLET | Freq: Once | ORAL | Status: AC
  Administered 2021-12-05: 800 mg via ORAL
  Filled 2021-12-05: qty 1

## 2021-12-05 MED ORDER — IOHEXOL 350 MG/ML SOLN: 100.0000 mL | Freq: Once | INTRAVENOUS | Status: DC | PRN

## 2021-12-05 NOTE — ED Triage Notes (Signed)
Pt having fibroid pain and cramping in right lower abdomen. Pain is 10/10. Pt has intermittent vaginal bleeding

## 2021-12-05 NOTE — ED Provider Triage Note (Signed)
Emergency Medicine Provider Triage Evaluation Note  Kathleen Nguyen , a 36 y.o. female  was evaluated in triage.  Pt complains of right lower quadrant pain FOR the last couple days, he states that she has been having vaginal bleeding since the 26th, going through approximately 4-5 pads daily, states that she has history of fibroids, states this is worse than her usual pain, had 2 episodes of vomiting last bowel movement was today approximate 6 hours ago..  Review of Systems  Positive: Left lower quadrant pain vaginal bleeding Negative: Constipation, diarrhea  Physical Exam  BP 125/82 (BP Location: Right Arm)   Pulse 87   Temp 98.7 F (37.1 C) (Oral)   Resp 20   Ht '5\' 5"'$  (1.651 m)   Wt 83 kg   LMP 12/05/2021 (Exact Date)   SpO2 100%   BMI 30.45 kg/m  Gen:   Awake, no distress   Resp:  Normal effort  MSK:   Moves extremities without difficulty  Other:    Medical Decision Making  Medically screening exam initiated at 1:08 AM.  Appropriate orders placed.  Mannie Wineland was informed that the remainder of the evaluation will be completed by another provider, this initial triage assessment does not replace that evaluation, and the importance of remaining in the ED until their evaluation is complete.  Presents with abdominal pain, vaginal bleeding lab work and imaging ordered will need further work-up.   Marcello Fennel, PA-C 12/05/21 0109

## 2021-12-05 NOTE — ED Provider Notes (Signed)
[facility] EMERGENCY DEPARTMENT Provider Note   CSN: [id number] Arrival date & time: 12/05/21  0038     History  Chief Complaint  Patient presents with   Abdominal Cramping    Kathleen Nguyen is a 36 y.o. female.  The history is provided by the patient and medical records. The history is limited by a language barrier. A language interpreter was used.  Abdominal Cramping    36-year-old Arabic speaking female with significant history of abnormal uterine bleeding, uterine adenomyoma, presenting with complaints of abdominal pain.  History obtained using language interpreter.  Patient report for the past 3 weeks she has had recurrent vaginal bleeding.  Initially it started out a bit lax but became more heavy going through 4-5 pads per day.  Bleeding has since improved.  She attributed her vaginal bleeding due to history of uterine fibroid.  She is scheduled to be seen by a surgeon for further management next month.  She is here requesting to have that process expedited.  She did endorse some mild urinary discomfort several days prior but that has also resolved.  She does not endorse any lightheadedness dizziness fever chills nausea vomiting diarrhea or rectal bleeding.  She used Clofenax at home for pain without adequate relief.  She would like a different type of pain medication.  Home Medications Prior to Admission medications   Medication Sig Start Date End Date Taking? Authorizing Provider  acetaminophen (TYLENOL) 325 MG tablet Take 650 mg by mouth every 6 (six) hours as needed for mild pain.   Yes [provider]  diclofenac (VOLTAREN) 75 MG EC tablet Take 1 tablet (75 mg total) by mouth 2 (two) times daily. 11/14/21  Yes Eckstat, Matthew M, MD  norgestimate-ethinyl estradiol (ORTHO-CYCLEN) 0.25-35 MG-MCG tablet Take 1 tablet by mouth daily. 02/21/21  Yes Eckstat, Matthew M, MD  triamcinolone cream (KENALOG) 0.1 % Apply 1 application. topically 2 (two) times  daily as needed. 08/30/21  Yes Sheffield, Kelli R, PA-C  celecoxib (CELEBREX) 50 MG capsule Take 2 capsules (100 mg total) by mouth 2 (two) times daily as needed for pain. Patient not taking: Reported on 07/30/2021 03/30/21   Covington, Sarah M, PA-C  Certolizumab Pegol (CIMZIA PREFILLED) 2 X 200 MG/ML KIT INJECT 200 MG (1 SYRINGE) SUBCUTANEOUSLY EVERY 2 WEEKS Patient not taking: Reported on 07/30/2021 08/24/20   Deveshwar, Shaili, MD  Certolizumab Pegol (CIMZIA PREFILLED) 2 X 200 MG/ML PSKT Inject 200 mg into the skin every 14 (fourteen) days. Patient not taking: Reported on 07/30/2021 06/29/21   Dale, Taylor M, PA-C  HYDROcodone-acetaminophen (NORCO/VICODIN) 5-325 MG tablet Take 1-2 tablets by mouth every 4 (four) hours as needed for severe pain. Patient not taking: Reported on 07/30/2021 03/30/21   Covington, Sarah M, PA-C  polyethylene glycol powder (GLYCOLAX/MIRALAX) 17 GM/SCOOP powder Take 255 g by mouth daily as needed for moderate constipation or severe constipation. Do not take for more than 3 days in a row Patient not taking: Reported on 07/30/2021 02/21/21   Eckstat, Matthew M, MD      Allergies    Patient has no known allergies.    Review of Systems   Review of Systems  All other systems reviewed and are negative.   Physical Exam Updated Vital Signs BP 109/74 (BP Location: Right Arm)   Pulse 87   Temp 98.7 F (37.1 C) (Oral)   Resp 16   Ht 5' 5" (1.651 m)   Wt 83 kg   LMP 12/05/2021 (Exact Date)     SpO2 99%   BMI 30.45 kg/m  Physical Exam Vitals and nursing note reviewed.  Constitutional:      General: She is not in acute distress.    Appearance: She is well-developed.  HENT:     Head: Atraumatic.  Eyes:     Conjunctiva/sclera: Conjunctivae normal.  Cardiovascular:     Rate and Rhythm: Normal rate and regular rhythm.  Pulmonary:     Effort: Pulmonary effort is normal.  Abdominal:     General: Abdomen is flat.     Tenderness: There is no abdominal tenderness.   Musculoskeletal:     Cervical back: Neck supple.  Skin:    Findings: No rash.  Neurological:     Mental Status: She is alert.  Psychiatric:        Mood and Affect: Mood normal.     ED Results / Procedures / Treatments   Labs (all labs ordered are listed, but only abnormal results are displayed) Labs Reviewed  COMPREHENSIVE METABOLIC PANEL - Abnormal; Notable for the following components:      Result Value   Glucose, Bld 114 (*)    BUN 5 (*)    Calcium 8.7 (*)    AST 13 (*)    Alkaline Phosphatase 29 (*)    All other components within normal limits  CBC WITH DIFFERENTIAL/PLATELET - Abnormal; Notable for the following components:   Hemoglobin 11.6 (*)    HCT 35.1 (*)    All other components within normal limits  URINALYSIS, ROUTINE W REFLEX MICROSCOPIC - Abnormal; Notable for the following components:   Specific Gravity, Urine 1.045 (*)    Hgb urine dipstick MODERATE (*)    Ketones, ur 5 (*)    All other components within normal limits  LIPASE, BLOOD  PREGNANCY, URINE    EKG None  Radiology CT ABDOMEN PELVIS W CONTRAST  Result Date: 12/05/2021 CLINICAL DATA:  RLQ abdominal pain (Age >= 14y) EXAM: CT ABDOMEN AND PELVIS WITH CONTRAST TECHNIQUE: Multidetector CT imaging of the abdomen and pelvis was performed using the standard protocol following bolus administration of intravenous contrast. RADIATION DOSE REDUCTION: This exam was performed according to the departmental dose-optimization program which includes automated exposure control, adjustment of the mA and/or kV according to patient size and/or use of iterative reconstruction technique. CONTRAST:  131m OMNIPAQUE IOHEXOL 300 MG/ML  SOLN COMPARISON:  Multiple prior ultrasounds, MRI 12/10/2016. FINDINGS: Lower chest: No acute abnormality. Hepatobiliary: No focal liver abnormality is seen. The gallbladder is unremarkable. Pancreas: Unremarkable. No pancreatic ductal dilatation or surrounding inflammatory changes. Spleen:  Normal in size without focal abnormality. Adrenals/Urinary Tract: Adrenal glands are unremarkable. No hydronephrosis or nephrolithiasis. There is distension of the urinary bladder. Stomach/Bowel: The stomach is within normal limits. There is no evidence of bowel obstruction.The appendix is normal. Vascular/Lymphatic: No significant vascular findings are present. No enlarged abdominal or pelvic lymph nodes. Reproductive: There is a low-density right upper uterine intramural mass which is been described on prior ultrasounds, unchanged in size measuring 3.0 x 2.9 x 3.1 cm (series 3 image 58, series 7 image 100). Other: No abdominal wall hernia or abnormality. No abdominopelvic ascites. Musculoskeletal: No acute or significant osseous findings. IMPRESSION: No acute abdominopelvic abnormality.  Normal appendix. Unchanged right upper uterine mass measuring up to 3.1 cm, described on prior ultrasounds and MRI, probably an adenomyoma. Electronically Signed   By: JMaurine SimmeringM.D.   On: 12/05/2021 08:23    Procedures Procedures    Medications Ordered in ED Medications  iohexol (OMNIPAQUE) 350 MG/ML injection 100 mL (has no administration in time range)  ondansetron (ZOFRAN-ODT) disintegrating tablet 8 mg (8 mg Oral Given 12/05/21 0111)  iohexol (OMNIPAQUE) 300 MG/ML solution 100 mL (100 mLs Intravenous Contrast Given 12/05/21 0810)    ED Course/ Medical Decision Making/ A&P                           Medical Decision Making Amount and/or Complexity of Data Reviewed Labs: ordered.   BP 109/74 (BP Location: Right Arm)   Pulse 87   Temp 98.7 F (37.1 C) (Oral)   Resp 16   Ht 5' 5" (1.651 m)   Wt 83 kg   LMP 12/05/2021 (Exact Date)   SpO2 99%   BMI 30.45 kg/m   8:51 AM This is a 36-year-old Arabic speaking female recently diagnosed with uterine fibroid presenting requesting for a quicker follow-up with her surgeon for further definitive management of her fibroid.  She does endorse some abdominal  cramping and prolonged vaginal bleeding for about 3 weeks but states bleeding has since improved.  She did endorse some mild urinary discomfort several days prior but that has since resolved as well.  She is without complaint of lightheadedness dizziness or any fever.  She does not endorse any active abdominal pain at this time.  On exam, patient is well-appearing resting comfortably.  Abdomen is soft and nontender.  She is afebrile, vital signs stable.  Labs And imaging independently reviewed interpreted by me and I agree with radiologist interpretation.  Her pregnancy test is negative, electrolyte panels are similar to prior, normal lipase, normal WBC, who her hemoglobin is 11.6 without any significant signs of anemia.  An abdominal and pelvis CT scan demonstrate no acute abdominal pelvic pathology.  She has a normal appendix.  She does have an unchanged uterine mass measuring 3.1 cm, probably an adenomyoma.    I have considered pelvic exam however patient without symptoms concerning for infection and bleeding has since improved and she has known history of uterine fibroid.  I have low suspicion for PID, TOA, or ovarian torsion based on her history.   9:43 AM Fortunately urinalysis without signs of urinary tract infection.  There hemoglobin on urine dipsticks but this is likely a contaminant from her ongoing vaginal bleeding.  At this time will discharge home with naproxen and will have patient follow-up with her surgeon for further care.  This patient presents to the ED for concern of vaginal bleeding, this involves an extensive number of treatment options, and is a complaint that carries with it a high risk of complications and morbidity.  The differential diagnosis includes uterine fibroid, vaginal tear, PID, TOA, ectopic pregnancy, rectal bleeding, hematuria  Co morbidities that complicate the patient evaluation uterine fibroid Additional history obtained:  Additional history obtained from  patient External records from outside source obtained and reviewed including notes from OBGYN  Lab Tests:  I Ordered, and personally interpreted labs.  The pertinent results include:  as above  Imaging Studies ordered:  I ordered imaging studies including abd/pelvis CT I independently visualized and interpreted imaging which showed uterine mass at 3.1cm, unchanged I agree with the radiologist interpretation  Cardiac Monitoring:  The patient was maintained on a cardiac monitor.  I personally viewed and interpreted the cardiac monitored which showed an underlying rhythm of: NSR  Medicines ordered and prescription drug management:   I have reviewed the patients home medicines and have made   adjustments as needed  Test Considered: pelvic US, but felt it is low yield as she has known hx of uterine fibroid  Critical Interventions: as above   Problem List / ED Course: abnormal uterine bleeding  dysuria  Reevaluation:  After the interventions noted above, I reevaluated the patient and found that they have :stayed the same  Social Determinants of Health: language barrier  Dispostion:  After consideration of the diagnostic results and the patients response to treatment, I feel that the patent would benefit from outpt f/u.         Final Clinical Impression(s) / ED Diagnoses Final diagnoses:  Abnormal uterine bleeding    Rx / DC Orders ED Discharge Orders          Ordered    naproxen (NAPROSYN) 500 MG tablet  2 times daily        12/05/21 0948              , , PA-C 12/05/21 0950    Zavitz, Joshua, MD 12/10/21 2351  

## 2021-12-05 NOTE — Discharge Instructions (Signed)
You have been evaluated for abnormal uterine bleeding.  Fortunately your hemoglobin is fairly normal and does not show significant anemia.  You may take naproxen as needed for pain but follow-up closely with your OB/GYN and your surgeon for further managements of your uterine fibroid.  Your urine did not show any signs of a urinary tract infection.  CT scan of the abdomen and pelvis did show a 3.1 cm uterine mass that is unchanged from prior this is likely uterine fibroid.  luqud tem tekiyemk lenzief al-rahm ger al-tabiai. lacen al-hadh ? fin al-himoglubin al-khas beck tabiai elly had ma walla yazhar faker al-dem al-kabir. yemkenk tanaul al-nabruksin hassb al-haja lalam wilkin al-matabea an kathab ma OB / GYN walgrah lamzid minn idara al-warm ellifi al-rahmi. lem yazhar al-bol ei alamat ola wagud adwa fe al-massalak al-bawlia. azhar al-taswair al-muqtai al-muhawsib labatin walhoudh katla raham 3.1 sem lem tetger an al-sabeq ? wemen al-muhtamal an yakon hudha al-warm ellifi al-rahmi.   ??? ?? ?????? ????? ????? ??? ???????.  ???? ???? ? ??? ???????????? ????? ?? ????? ??? ?? ?? ??? ???? ??? ???? ??????.  ????? ????? ??????????? ??? ?????? ????? ???? ???????? ?? ??? ?? OB / GYN ??????? ????? ?? ????? ????? ?????? ??????.  ?? ???? ????? ?? ?????? ??? ???? ???? ?? ??????? ???????.  ???? ??????? ??????? ??????? ????? ?????? ????  ??? 3.1 ?? ?? ????? ?? ?????? ? ??? ??????? ?? ???? ??? ????? ?????? ??????.

## 2021-12-06 ENCOUNTER — Telehealth: Payer: Self-pay

## 2021-12-06 NOTE — Telephone Encounter (Signed)
Transition Care Management Unsuccessful Follow-up Telephone Call  Date of discharge and from where:  12/05/2021 from [facility]  Attempts:  1st Attempt  Reason for unsuccessful TCM follow-up call:  Left voice message

## 2021-12-07 NOTE — Telephone Encounter (Signed)
Transition Care Management Unsuccessful Follow-up Telephone Call  Date of discharge and from where:  12/05/2021 from [facility]  Attempts:  2nd Attempt  Reason for unsuccessful TCM follow-up call:  Left voice message

## 2021-12-11 NOTE — Telephone Encounter (Signed)
Transition Care Management Unsuccessful Follow-up Telephone Call  Date of discharge and from where:  12/05/2021 from [facility]  Attempts:  3rd Attempt  Reason for unsuccessful TCM follow-up call:  Unable to reach patient

## 2021-12-20 NOTE — Progress Notes (Signed)
Office Visit Note  Patient: Kathleen Nguyen             Date of Birth: December 26, 1985           MRN: [id number]             PCP: Kerin Perna, NP Referring: Kerin Perna, NP Visit Date: 12/28/2021 Occupation: '@GUAROCC'$ @  Interpreter: Harriet Pho  Subjective:  Discuss restarting cimzia   History of Present Illness: Kathleen Nguyen is a 36 y.o. female with history of ankylosing spondylitis.  Patient was last seen in the office on 01/29/21.  She was previously prescribed cimzia 200 mg sq injections every 2 weeks.  She has been off of Cimzia for the past 6 months.  She reports that she has been dealing with increased pelvic pain which she attributes to fibroids.  She has an upcoming appointment on 01/07/2022 with gynecology to discuss proceeding with surgery.  She has been taking Tylenol as needed for symptomatic relief.  She is currently anemic which has been contributing to her level of fatigue.   Since being off of Cimzia she has had intermittent discomfort in both shoulders and her lower back.  She denies any joint swelling.   She denies any recent infections.     Activities of Daily Living:  Patient reports morning stiffness for 5-10 minutes.   Patient Reports nocturnal pain.  Difficulty dressing/grooming: Denies Difficulty climbing stairs: Denies Difficulty getting out of chair: Denies Difficulty using hands for taps, buttons, cutlery, and/or writing: Reports  Review of Systems  Constitutional:  Positive for fatigue.  HENT:  Positive for mouth sores, mouth dryness and nose dryness.   Eyes:  Positive for dryness. Negative for pain and itching.  Respiratory:  Negative for shortness of breath and difficulty breathing.   Cardiovascular:  Negative for chest pain and palpitations.  Gastrointestinal:  Negative for blood in stool, constipation and diarrhea.  Endocrine: Negative for increased urination.  Genitourinary:  Negative for difficulty urinating.  Musculoskeletal:   Positive for joint pain, joint pain, joint swelling, myalgias, morning stiffness, muscle tenderness and myalgias.  Skin:  Positive for rash. Negative for color change.  Allergic/Immunologic: Negative for susceptible to infections.  Neurological:  Negative for dizziness, numbness, headaches, memory loss and weakness.  Hematological:  Negative for bruising/bleeding tendency.  Psychiatric/Behavioral:  Negative for confusion.     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   Anemia in pregnancy 12/07/2018   Language barrier 12/04/2018   History of cesarean delivery 12/04/2018   Ankylosing spondylitis ([place]) 11/11/2018   Uterine adenomyoma 12/11/2016    Past Medical History:  Diagnosis Date   Anemia    Ankylosing spondylitis (HCC)    Ankylosing spondylitis (HCC)    Rheumatoid arthritis ([place])     Family History  Problem Relation Age of Onset   Arthritis/Rheumatoid Mother    Past Surgical History:  Procedure Laterality Date   CESAREAN SECTION     CESAREAN SECTION     CESAREAN SECTION N/A 05/03/2019   Procedure: CESAREAN SECTION;  Surgeon: Woodroe Mode, MD;  Location: [facility] LD ORS;  Service: Obstetrics;  Laterality: N/A;   Social History   Social History Narrative   Not on file   Immunization History  Administered Date(s) Administered   Influenza,inj,Quad PF,6+ Mos 02/22/2019   Janssen (J&J) SARS-COV-2 Vaccination 09/18/2019   PFIZER(Purple Top)SARS-COV-2 Vaccination 05/08/2020   Tdap 12/18/2017,  02/22/2019     Objective: Vital Signs: BP 104/73 (BP Location: Left Arm, Patient Position: Sitting, Cuff Size: Normal)   Pulse 83   Ht '5\' 5"'$  (1.651 m)   Wt 182 lb (82.6 kg)   LMP 12/05/2021 (Exact Date)   BMI 30.29 kg/m    Physical Exam Vitals and nursing note reviewed.  Constitutional:      Appearance: She is well-developed.  HENT:     Head: Normocephalic and  atraumatic.  Eyes:     Conjunctiva/sclera: Conjunctivae normal.  Cardiovascular:     Rate and Rhythm: Normal rate and regular rhythm.     Heart sounds: Normal heart sounds.  Pulmonary:     Effort: Pulmonary effort is normal.     Breath sounds: Normal breath sounds.  Abdominal:     General: Bowel sounds are normal.     Palpations: Abdomen is soft.  Musculoskeletal:     Cervical back: Normal range of motion.  Skin:    General: Skin is warm and dry.     Capillary Refill: Capillary refill takes less than 2 seconds.  Neurological:     Mental Status: She is alert and oriented to person, place, and time.  Psychiatric:        Behavior: Behavior normal.      Musculoskeletal Exam: C-spine has good range of motion with no discomfort.  Midline spinal tenderness in the thoracic and lumbar region.  No SI joint tenderness upon palpation.  Shoulder joints have good range of motion with some discomfort and stiffness bilaterally.  Elbow joints, wrist joints, MCPs, PIPs, DIPs have good range of motion with no synovitis.  She was able to make a complete fist bilaterally.  Hip joints have good range of motion with no groin pain.  Knee joints have good range of motion with no warmth or effusion.  Ankle joints have good range of motion with no tenderness or joint swelling.  No evidence of Achilles tendinitis.  Tenderness along the plantar fascia of both feet.  No tenderness over MTP joints.  CDAI Exam: CDAI Score: -- Patient Global: --; Provider Global: -- Swollen: --; Tender: -- Joint Exam 12/28/2021   No joint exam has been documented for this visit   There is currently no information documented on the homunculus. Go to the Rheumatology activity and complete the homunculus joint exam.  Investigation: No additional findings.  Imaging: CT ABDOMEN PELVIS W CONTRAST  Result Date: 12/05/2021 CLINICAL DATA:  RLQ abdominal pain (Age >= 14y) EXAM: CT ABDOMEN AND PELVIS WITH CONTRAST TECHNIQUE:  Multidetector CT imaging of the abdomen and pelvis was performed using the standard protocol following bolus administration of intravenous contrast. RADIATION DOSE REDUCTION: This exam was performed according to the departmental dose-optimization program which includes automated exposure control, adjustment of the mA and/or kV according to patient size and/or use of iterative reconstruction technique. CONTRAST:  119m OMNIPAQUE IOHEXOL 300 MG/ML  SOLN COMPARISON:  Multiple prior ultrasounds, MRI 12/10/2016. FINDINGS: Lower chest: No acute abnormality. Hepatobiliary: No focal liver abnormality is seen. The gallbladder is unremarkable. Pancreas: Unremarkable. No pancreatic ductal dilatation or surrounding inflammatory changes. Spleen: Normal in size without focal abnormality. Adrenals/Urinary Tract: Adrenal glands are unremarkable. No hydronephrosis or nephrolithiasis. There is distension of the urinary bladder. Stomach/Bowel: The stomach is within normal limits. There is no evidence of bowel obstruction.The appendix is normal. Vascular/Lymphatic: No significant vascular findings are present. No enlarged abdominal or pelvic lymph nodes. Reproductive: There is a low-density right upper uterine intramural mass which  is been described on prior ultrasounds, unchanged in size measuring 3.0 x 2.9 x 3.1 cm (series 3 image 58, series 7 image 100). Other: No abdominal wall hernia or abnormality. No abdominopelvic ascites. Musculoskeletal: No acute or significant osseous findings. IMPRESSION: No acute abdominopelvic abnormality.  Normal appendix. Unchanged right upper uterine mass measuring up to 3.1 cm, described on prior ultrasounds and MRI, probably an adenomyoma. Electronically Signed   By: Maurine Simmering M.D.   On: 12/05/2021 08:23    Recent Labs: Lab Results  Component Value Date   WBC 6.2 12/05/2021   HGB 11.6 (L) 12/05/2021   PLT 326 12/05/2021   NA 138 12/05/2021   K 3.7 12/05/2021   CL 107 12/05/2021   CO2 25  12/05/2021   GLUCOSE 114 (H) 12/05/2021   BUN 5 (L) 12/05/2021   CREATININE 0.52 12/05/2021   BILITOT 0.3 12/05/2021   ALKPHOS 29 (L) 12/05/2021   AST 13 (L) 12/05/2021   ALT 11 12/05/2021   PROT 7.2 12/05/2021   ALBUMIN 3.7 12/05/2021   CALCIUM 8.7 (L) 12/05/2021   GFRAA 144 10/25/2020   QFTBGOLDPLUS NEGATIVE 06/28/2021    Speciality Comments: No specialty comments available.  Procedures:  No procedures performed Allergies: Patient has no known allergies.    Assessment / Plan:     Visit Diagnoses: Ankylosing spondylitis of lumbosacral region [facility]): She has been experiencing intermittent pain and stiffness in both shoulders, thoracic spine, lumbar spine.  She experiences intermittent nocturnal pain in her lower back.  On examination today she has midline spinal tenderness in the thoracic and lumbar region.  No SI joint tenderness upon palpation.  She had no synovitis or dactylitis on exam.  She has tenderness along the plantar fascia of both feet but no evidence of Achilles tendinitis.  The patient was last seen in the office on 01/29/2021.  She was previously prescribed Cimzia 200 mg subcutaneous injections every 2 weeks.  She has been off of Cimzia for the past 6 months due to loss of follow-up and requiring updated lab work.  She has been taking Tylenol as needed for symptomatic relief.  Most of her discomfort has been due to pelvic pain secondary to uterine adenomyoma.  She has [place] appointment with her gynecologist on 01/07/2022 to discuss proceeding with surgery.  She would like to hold off on resuming Cimzia until after her surgery has been completed.  She will require surgical clearance to restart Cimzia in the office.  Discussed that since it has been longer than 3 months since her last Cimzia dose she will need to reinitiate therapy in the office.  She was in agreement.  She will notify us once she received surgical clearance.  She will follow-up in the office in 2 to 3  months.  Sacroiliitis [facility]): No SI joint tenderness upon palpation today.  Most of her discomfort and tenderness is midline in the lumbar region.    High risk medication use -Currently holding Cimzia 200 mg sq injections every 2 weeks.   CBC and CMP were drawn on 12/05/2021.   TB Gold negative on 06/28/2021. Discussed the importance of holding Cimzia if she develops signs or symptoms of an infection and to resume once the infection has completely cleared once she has resumed therapy. The patient is planning on proceeding with a hysterectomy once she has been evaluated by her gynecologist on 01/07/2022.  She would like to hold off on restarting Cimzia until after the surgery has been completed.  She will  require surgical clearance prior to restarting Cimzia in the office.  Effusion, left knee: Resolved.  No recurrence.  No warmth or effusion noted on examination today.   Uterine adenomyoma: Patient was evaluated in the ED on 03/30/2021, 07/30/2021, on 12/05/2021 for pelvic pain.  She has been experiencing abnormal uterine bleeding intermittently.  She is currently experiencing fatigue secondary to anemia.  She has an upcoming appointment with gynecology on 01/07/2022 to discuss proceeding with surgery.   We will hold off on restarting her on Cimzia until she has been cleared by her surgeon.  Dry eyes: She continues to have chronic sicca symptoms.  No conjunctival injection noted.   Language barrier: Interpreter: Rachael Fee  Orders: No orders of the defined types were placed in this encounter.  No orders of the defined types were placed in this encounter.     Follow-Up Instructions: Return in about 2 months (around 02/28/2022) for Ankylosing Spondylitis.   Ofilia Neas, PA-C  Note - This record has been created using Dragon software.  Chart creation errors have been sought, but may not always  have been located. Such creation errors do not reflect on  the standard of medical care.

## 2021-12-28 ENCOUNTER — Telehealth: Payer: Self-pay | Admitting: Pharmacist

## 2021-12-28 ENCOUNTER — Encounter: Payer: Self-pay | Admitting: Physician Assistant

## 2021-12-28 ENCOUNTER — Ambulatory Visit (INDEPENDENT_AMBULATORY_CARE_PROVIDER_SITE_OTHER): Payer: 59 | Admitting: Physician Assistant

## 2021-12-28 VITALS — BP 104/73 | HR 83 | Ht 65.0 in | Wt 182.0 lb

## 2021-12-28 DIAGNOSIS — M25462 Effusion, left knee: Secondary | ICD-10-CM

## 2021-12-28 DIAGNOSIS — D269 Other benign neoplasm of uterus, unspecified: Secondary | ICD-10-CM

## 2021-12-28 DIAGNOSIS — M461 Sacroiliitis, not elsewhere classified: Secondary | ICD-10-CM | POA: Diagnosis not present

## 2021-12-28 DIAGNOSIS — Z79899 Other long term (current) drug therapy: Secondary | ICD-10-CM | POA: Diagnosis not present

## 2021-12-28 DIAGNOSIS — M457 Ankylosing spondylitis of lumbosacral region: Secondary | ICD-10-CM

## 2021-12-28 DIAGNOSIS — Z758 Other problems related to medical facilities and other health care: Secondary | ICD-10-CM

## 2021-12-28 DIAGNOSIS — H5712 Ocular pain, left eye: Secondary | ICD-10-CM

## 2021-12-28 DIAGNOSIS — Z789 Other specified health status: Secondary | ICD-10-CM

## 2021-12-28 DIAGNOSIS — H04123 Dry eye syndrome of bilateral lacrimal glands: Secondary | ICD-10-CM

## 2021-12-28 NOTE — Patient Instructions (Signed)
[facility], [place], [place] [address]  Phone: [phone]

## 2021-12-28 NOTE — Telephone Encounter (Addendum)
Please start Cimzia BIV (for self-admin). Patient has been off of therapy for 6 months, so will need to reload.  Dose: 400 mg subQ on Day 0, then '400mg'$  SQ at week 2, week 4 then 400 mg SQ every 4 weeks   Dx: Ankylosing spondylitis (M45.0)  Previously tried therapies: Cimzia - stable but d/c d/t loss to f/u  Knox Saliva, PharmD, MPH, BCPS, CPP Clinical Pharmacist (Rheumatology and Pulmonology)  ----- Message from Earnestine Mealing, [place] sent at 12/28/2021 12:21 PM EDT ----- Please check status of cimzia approval. Patient will be restarting cimzia after surgery (and surgical clearance is received). Thanks!

## 2021-12-31 ENCOUNTER — Other Ambulatory Visit (HOSPITAL_COMMUNITY): Payer: Self-pay

## 2021-12-31 NOTE — Telephone Encounter (Signed)
Attempted to submit PA request via CMM, however request canceled due to termination of member's coverage and to "resubmit using active member ID information". Eligibility check in [place] populates with same info displayed in Epic and what is reflected on card scanned into media tab. Attempted to submit PA both WITH and WITHOUT the letters in Member ID number.  Will attempt to reach out to [facility] for assistance.  Member Services: [phone] Provider Services: [phone] Pharmacist Services: [phone]

## 2022-01-03 NOTE — Telephone Encounter (Signed)
Called and verified that she IS active with Medicaid.  Attempting to submit PA through [facility] brings up this prompt:

## 2022-01-07 ENCOUNTER — Ambulatory Visit (INDEPENDENT_AMBULATORY_CARE_PROVIDER_SITE_OTHER): Payer: 59 | Admitting: Obstetrics and Gynecology

## 2022-01-07 ENCOUNTER — Encounter: Payer: Self-pay | Admitting: Obstetrics and Gynecology

## 2022-01-07 ENCOUNTER — Other Ambulatory Visit: Payer: Self-pay

## 2022-01-07 VITALS — BP 112/76 | HR 97 | Ht 65.0 in | Wt 180.6 lb

## 2022-01-07 DIAGNOSIS — N939 Abnormal uterine and vaginal bleeding, unspecified: Secondary | ICD-10-CM | POA: Diagnosis not present

## 2022-01-07 DIAGNOSIS — Z789 Other specified health status: Secondary | ICD-10-CM

## 2022-01-07 DIAGNOSIS — D259 Leiomyoma of uterus, unspecified: Secondary | ICD-10-CM

## 2022-01-07 MED ORDER — MYFEMBREE 40-1-0.5 MG PO TABS: 1.0000 | ORAL_TABLET | Freq: Every day | ORAL | 6 refills | Status: DC

## 2022-01-07 NOTE — Progress Notes (Signed)
Irregular menses, fibroid diagnosed 10/2021, heavy menses present  G4P3 , all c sections 6 month hx of bleeding Pt has been on OCP x 1 year    CC: Sonata consultation Subjective:    Patient ID: Kathleen Nguyen, female    DOB: 1986/06/24, 36 y.o.   MRN: [id number]  HPI 36 yo G4P3, c/s x 3, seen for discussion for possible Sonata procedure.  She notes  a 6 month hx of irregular and heavy bleeding.  A fibroid was diagnosed on 10/2021.  She has been on OCP for 1 year, and also has a normal endometrial biopsy from 2022.  Reviewed previous notes and ultrasound.  She has a singular small to moderate sized fibroid which seems to be intramural or subserosal.   Review of Systems     Objective:   Physical Exam Vitals:   01/07/22 1356  BP: 112/76  Pulse: 97   SVE: normal sized uterus, no palpable fibroid.  No adnexal mass or pain. SSE: cervix is very small and difficult to see during the exam.    CLINICAL DATA:  RIGHT lower quadrant abdominal pain, R 10.31, LMP 07/27/2021   EXAM: TRANSABDOMINAL AND TRANSVAGINAL ULTRASOUND OF PELVIS   DOPPLER ULTRASOUND OF OVARIES   TECHNIQUE: Both transabdominal and transvaginal ultrasound examinations of the pelvis were performed. Transabdominal technique was performed for global imaging of the pelvis including uterus, ovaries, adnexal regions, and pelvic cul-de-sac.   It was necessary to proceed with endovaginal exam following the transabdominal exam to visualize the endometrium, and to characterize a uterine mass. Color and duplex Doppler ultrasound was utilized to evaluate blood flow to the ovaries.   COMPARISON:  12/09/2020, 10/08/2016   FINDINGS: Uterus   Measurements: 11.0 x 4.3 x 5.1 cm = volume: 130 mL. Anteverted. Normal myometrial echogenicity. Mass identified intramural at RIGHT upper uterus, 3.1 x 1.9 x 2.3 cm, containing a hyperechoic rim and an isoechoic homogeneous central component. This corresponds in size and position with a  similar mass seen on the prior study of 12/09/2020. On an earlier study from 2019, this was a predominantly hyperechoic mass with several small hypoechoic internal foci suggesting lipoleiomyoma or adenomyoma.   Endometrium   Thickness: 8 mm.  No endometrial fluid or mass   Right ovary   Measurements: 3.6 x 2.0 x 2.5 cm = volume: 9.8 mL. Normal morphology without mass   Left ovary   Measurements: 4.7 x 1.6 x 2.5 cm = volume: 9.7 mL. Normal morphology without mass   Pulsed Doppler evaluation of both ovaries demonstrates low resistance arterial and venous waveforms in both ovaries.   Other findings   No free pelvic fluid.  No adnexal masses.   IMPRESSION: Again identified 3.1 cm diameter RIGHT upper uterine intramural mass, question leiomyoma, lipoleiomyoma or adenomyomatosis, demonstrating little changed since previous study and decreased internal echogenicity since 2018 question degeneration.   No other pelvic sonographic abnormalities.       Assessment & Plan:   1. Language barrier Live interpreter present  2. Abnormal uterine bleeding (AUB) - Relugolix-Estradiol-Norethind (MYFEMBREE) 40-1-0.5 MG TABS; Take 1 tablet by mouth daily.  Dispense: 28 tablet; Refill: 6  3. Uterine leiomyoma, unspecified location Sonata procedure is a possibility, however, may be difficult due to no previous vaginal deliveries and poor visualization of cervix.  Procedure would address the fibroid, but bleeding and pain may not be addressed. Discussed myfembree as an alternative to address all 3 problems. If the medication fails, can consider Sonata at that time or  definitive therapy.  - Relugolix-Estradiol-Norethind (MYFEMBREE) 40-1-0.5 MG TABS; Take 1 tablet by mouth daily.  Dispense: 28 tablet; Refill: 6  F/u in 4 months.  Griffin Basil, MD Faculty Attending, [facility]

## 2022-01-10 ENCOUNTER — Other Ambulatory Visit (HOSPITAL_COMMUNITY): Payer: Self-pay

## 2022-01-10 NOTE — Telephone Encounter (Signed)
Reached out to pt via interpreter services and learned plan info for pt's [employer]:  RxBIN: [id number] RxPCN: [id number] RxGRP: [id number] ID: [id number]   Submitted a Prior Authorization request to  [place]  for [place] via CoverMyMeds. Will update once we receive a response.  Key: [id number]   Due to likelihood of being directed to download PA form from [employer] portal, will preemptively print off and complete that as well.  PA form faxed to [facility] Fax# [phone] Phone# [phone]  EtB form faxed to [employer] Fax# [phone] Phone#[phone]

## 2022-01-11 ENCOUNTER — Other Ambulatory Visit (HOSPITAL_COMMUNITY): Payer: Self-pay

## 2022-01-11 NOTE — Telephone Encounter (Signed)
Received fax from [place] [facility] stating that authorization for [employer] is already on file. Unable to run test claim as patient must fill through  [place].  Case # [id number]  Enrolled patient into copay card in case [place] is unable to bill Medicaid as secondary:  BIN: [id number] PCN: [id number] Group: [id number] ID: [id number] Phone: [phone].  Knox Saliva, PharmD, MPH, BCPS, CPP Clinical Pharmacist (Rheumatology and Pulmonology)

## 2022-01-11 NOTE — Telephone Encounter (Signed)
Received notification from  [employer]  regarding a prior authorization for [facility]. Authorization has been APPROVED from 01/10/22 to 01/11/23.   Unable to run test claim as patient must fill through  [place].  Authorization # [id number] Phone # [phone], option 3  Called [employer] to determine if loading dose is covered by plan or if authorization needs to be run for Cimzia starter kit. Rep reached out to supervisor who confirmed that loading dose is not included. Initiated authorization on the phone for [employer].  Case # [id number]  Turnaround time is up to 72 hours  Knox Saliva, PharmD, MPH, BCPS, CPP Clinical Pharmacist (Rheumatology and Pulmonology)

## 2022-01-14 NOTE — Telephone Encounter (Signed)
Called patient using Arabic interpreter # [phone]. Per review of OBGYN note, patient will not be proceeding with surgery at this time. Patient was switched to myfembree to address fibroid, bleeding, and pain.  Will proceed with restarting Cimzia at this time. Patient reports no signs or symptoms of infection and no antibiotic use. Patient is scheduled to restart Cimzia on 01/17/22  Knox Saliva, PharmD, MPH, BCPS, CPP Clinical Pharmacist (Rheumatology and Pulmonology)

## 2022-01-14 NOTE — Progress Notes (Unsigned)
Pharmacy Note  Subjective:   Patient presents to clinic today to restart Cimzia for ankylosing spondylitis. She has been off of Cimzia for at least 6 months.  Per review of OBGYN note, patient will not be moving forward with Sonata procedure for fibroid. Plan was made to switch patient to MyFembree to address fibroid, bleeding, and pain. Surgery remains a possibility in future.  She states she is likely changing insurance at the end of August 2023. She may be dropping [employer] and getting another plan off of [employer]  Patient running a fever or have signs/symptoms of infection? No  Patient currently on antibiotics for the treatment of infection? No  Patient have any upcoming invasive procedures/surgeries? No  Objective: CMP     Component Value Date/Time   NA 138 12/05/2021 0110   NA 139 02/22/2019 0920   K 3.7 12/05/2021 0110   CL 107 12/05/2021 0110   CO2 25 12/05/2021 0110   GLUCOSE 114 (H) 12/05/2021 0110   BUN 5 (L) 12/05/2021 0110   BUN 6 02/22/2019 0920   CREATININE 0.52 12/05/2021 0110   CREATININE 0.53 06/28/2021 1341   CALCIUM 8.7 (L) 12/05/2021 0110   PROT 7.2 12/05/2021 0110   PROT 6.2 02/22/2019 0920   ALBUMIN 3.7 12/05/2021 0110   ALBUMIN 3.5 (L) 02/22/2019 0920   AST 13 (L) 12/05/2021 0110   ALT 11 12/05/2021 0110   ALKPHOS 29 (L) 12/05/2021 0110   BILITOT 0.3 12/05/2021 0110   BILITOT <0.2 02/22/2019 0920   GFRNONAA >60 12/05/2021 0110   GFRNONAA 125 10/25/2020 1102   GFRAA 144 10/25/2020 1102    CBC    Component Value Date/Time   WBC 6.2 12/05/2021 0110   RBC 3.98 12/05/2021 0110   HGB 11.6 (L) 12/05/2021 0110   HGB 12.8 08/26/2019 1409   HCT 35.1 (L) 12/05/2021 0110   HCT 40.0 08/26/2019 1409   PLT 326 12/05/2021 0110   PLT 338 08/26/2019 1409   MCV 88.2 12/05/2021 0110   MCV 88 08/26/2019 1409   MCH 29.1 12/05/2021 0110   MCHC 33.0 12/05/2021 0110   RDW 12.7 12/05/2021 0110   RDW 14.1 08/26/2019 1409   LYMPHSABS 1.4  12/05/2021 0110   LYMPHSABS 1.8 08/26/2019 1409   MONOABS 0.3 12/05/2021 0110   EOSABS 0.0 12/05/2021 0110   EOSABS 0.1 08/26/2019 1409   BASOSABS 0.0 12/05/2021 0110   BASOSABS 0.0 08/26/2019 1409    Baseline Immunosuppressant Therapy Labs TB GOLD    Latest Ref Rng & Units 06/28/2021    1:41 PM  Quantiferon TB Gold  Quantiferon TB Gold Plus NEGATIVE NEGATIVE    Hepatitis Panel    Latest Ref Rng & Units 12/04/2018   11:17 AM  Hepatitis  Hep B Surface Ag Negative Negative    HIV Lab Results  Component Value Date   HIV Non Reactive 02/22/2019   HIV Non Reactive 12/04/2018   HIV Non Reactive 12/18/2017   Immunoglobulins    Latest Ref Rng & Units 12/19/2017    8:59 AM  Immunoglobulin Electrophoresis  IgA  81 - 463 mg/dL 230   IgG 694 - 1,618 mg/dL 1,805   IgM 48 - 271 mg/dL 149    SPEP    Latest Ref Rng & Units 12/05/2021    1:10 AM  Serum Protein Electrophoresis  Total Protein 6.5 - 8.1 g/dL 7.2    Chest x-ray: 12/19/17 - No active cardiopulmonary disease.  Assessment/Plan:  Demonstrated proper injection technique with Cimzia pre-filled  syringe demo device. Patient able to demonstrate proper injection technique using the teach back method. Patient self injected in the right lower abdomen and left lower abdomen with:  Sample Medication: Cimzia 2 x 273m/ml pre-filled syringes NDC: 596565-9943-71Lot: [id number]: 03/2022  Patient tolerated well.  Observed for 30 mins in office for adverse reaction and none noted.   Patient is to return in 1 month for labs and 6-8 weeks for follow-up appointment.  Standing orders placed.   Cimzia approved through insurance .   Rx sent to:  [facility].  Patient provided with pharmacy phone number and advised to call later this week to schedule shipment to home. Rx was sent for Cimzia starter kit but patient is aware that she will use dose due dates that I've provided.  Patient will continue Cimzia 4062mat Week  0 (administered in clinic today), Week 2 (01/31/22), Week 4 (02/14/22), then 40051mQ every 4 weeks thereafter (starting 03/14/22). She takes naproxen and diclofenc as needed.  She has been advised to reach out to the clinic's pharmacy team regarding insurance changes to ensure there is no interruption in therapy.She verbalized understanding.  All questions encouraged and answered.  Instructed patient to call with any further questions or concerns.  DevKnox SalivaharmD, MPH, BCPS, CPP Clinical Pharmacist (Rheumatology and Pulmonology)  01/14/2022 11:59 AM

## 2022-01-14 NOTE — Patient Instructions (Incomplete)
Your next CIMZIA dose is due on 01/31/22, 02/14/22, and every 4 weeks thereafter (starting on 03/14/22)  HOLD CIMZIA if you have signs or symptoms of an infection. You can resume once you feel better or back to your baseline. HOLD CIMZIA if you start antibiotics to treat an infection. HOLD CIMZIA around the time of surgery/procedures. Your surgeon will be able to provide recommendations on when to hold BEFORE and when you are cleared to [facility].  Pharmacy information: Your prescription will be shipped from [facility]. Their phone number is *** Please call to schedule shipment and confirm address. They will mail your medication to your home. If they cannot bill your Medicaid, then they will have to use the copay card.  Cost information: Your copay should be affordable. If you call the pharmacy and it is not affordable, please double-check that they are billing through your copay card as secondary coverage. That copay card information is: BIN: [id number] PCN: CN Group: [id number] ID: [id number]  Labs are due in 1 month then every 3 months. Lab hours are from Monday to Thursday 1:30-4:30pm and Friday 1:30-4pm. You do not need an appointment if you come for labs during these times.  How to manage an injection site reaction: Remember the 5 C's: COUNTER - leave on the counter at least 30 minutes but up to overnight to bring medication to room temperature. This may help prevent stinging COLD - place something cold (like an ice gel pack or cold water bottle) on the injection site just before cleansing with alcohol. This may help reduce pain CLARITIN - use Claritin (generic name is loratadine) for the first two weeks of treatment or the day of, the day before, and the day after injecting. This will help to minimize injection site reactions CORTISONE CREAM - apply if injection site is irritated and itching CALL ME - if injection site reaction is bigger than the size of your fist, looks  infected, blisters, or if you develop hives

## 2022-01-15 ENCOUNTER — Other Ambulatory Visit: Payer: Self-pay

## 2022-01-15 NOTE — Progress Notes (Signed)
PA sent for Rx Myfembree and PA denied on 01/11/22. Will send staff message to Dr Elgie Congo for alternative.  Colletta Maryland, RNC

## 2022-01-17 ENCOUNTER — Ambulatory Visit (INDEPENDENT_AMBULATORY_CARE_PROVIDER_SITE_OTHER): Payer: 59 | Admitting: Pharmacist

## 2022-01-17 ENCOUNTER — Telehealth: Payer: Self-pay

## 2022-01-17 ENCOUNTER — Telehealth: Payer: Self-pay | Admitting: Pharmacist

## 2022-01-17 VITALS — BP 115/82 | HR 89

## 2022-01-17 DIAGNOSIS — Z79899 Other long term (current) drug therapy: Secondary | ICD-10-CM

## 2022-01-17 DIAGNOSIS — Z7189 Other specified counseling: Secondary | ICD-10-CM

## 2022-01-17 DIAGNOSIS — M457 Ankylosing spondylitis of lumbosacral region: Secondary | ICD-10-CM

## 2022-01-17 MED ORDER — CIMZIA STARTER KIT 6 X 200 MG/ML ~~LOC~~ PSKT: PREFILLED_SYRINGE | SUBCUTANEOUS | 0 refills | Status: DC

## 2022-01-17 NOTE — Telephone Encounter (Signed)
Call placed to pt. Spoke with pt. Pt advised PA for Myfembree Rx denied. Pt verbalized understanding.  Will route to Dr Elgie Congo for alt. Rx.  [place]

## 2022-01-17 NOTE — Telephone Encounter (Signed)
Patient restarted Cimzia today in-office. She states that she may be dropping Friday plan on 02/21/22. She has not yet decided if she will buy another plan on marketplace.   Please reach out to patient on 02/22/22 regarding insurance update if she has not done so already. She will need Cimzia re-authorized through new insurance and/or Medicaid (if Medicaid becomes her primary)  Knox Saliva, PharmD, MPH, BCPS, CPP Clinical Pharmacist (Rheumatology and Pulmonology)

## 2022-01-21 MED ORDER — TRANEXAMIC ACID 650 MG PO TABS: 1300.0000 mg | ORAL_TABLET | Freq: Three times a day (TID) | ORAL | 3 refills | Status: DC

## 2022-01-21 MED ORDER — TRANEXAMIC ACID 650 MG PO TABS: 1300.0000 mg | ORAL_TABLET | Freq: Three times a day (TID) | ORAL | 0 refills | Status: DC

## 2022-01-21 NOTE — Telephone Encounter (Signed)
Call placed back to pt with update from Dr Elgie Congo as below.  Spoke with pt. Pt agreeable to try Rx Lysteda. Rx sent to pharmacy on file.  Pt to have follow up with Dr Elgie Congo in 4 months ( Nov 2023) Haskell Flirt, Carolann Littler, MD   If medication not approved can try lysteda 1 st option to address the bleeding, would need to stop ocp at that time.  '@nd'$  option would be [place] procedure.  She would need to fill out insurance paper work and submit to see if it be approved.

## 2022-01-28 ENCOUNTER — Telehealth: Payer: Self-pay | Admitting: Rheumatology

## 2022-01-28 NOTE — Telephone Encounter (Signed)
Returned patient's call and patient states she has not received any shipments from the pharmacy. Patient's last injection was in the office on 01/17/2022. I called [facility] and they do have the prescription and will contact the patient today to verify her information. I advised the pharmacy this needs to be done urgently as patient is due for an injection on Thursday. The pharmacy tech assured me they would call the patient today and get the medication shipped overnight and she should receive it no later than Wednesday. I called the patient back and advised her of entire conversation and she verbalized understanding.

## 2022-01-28 NOTE — Telephone Encounter (Signed)
Patient called requesting a return call regarding her Cimzia medication dosage and refill.

## 2022-02-01 ENCOUNTER — Other Ambulatory Visit (HOSPITAL_COMMUNITY): Payer: Self-pay

## 2022-02-01 ENCOUNTER — Telehealth: Payer: Self-pay | Admitting: Rheumatology

## 2022-02-01 DIAGNOSIS — M457 Ankylosing spondylitis of lumbosacral region: Secondary | ICD-10-CM

## 2022-02-01 DIAGNOSIS — Z79899 Other long term (current) drug therapy: Secondary | ICD-10-CM

## 2022-02-01 NOTE — Telephone Encounter (Signed)
Patient called stating she still hasn't received her Cimzia medication.  Patient requested a return call.

## 2022-02-04 NOTE — Progress Notes (Deleted)
Office Visit Note  Patient: Kathleen Nguyen             Date of Birth: March 12, 1986           MRN: [id number]             PCP: Kerin Perna, NP Referring: Kerin Perna, NP Visit Date: 02/18/2022 Occupation: '@GUAROCC'$ @  Subjective:  No chief complaint on file.   History of Present Illness: Kathleen Nguyen is a 36 y.o. female ***   Activities of Daily Living:  Patient reports morning stiffness for *** {minute/hour:19697}.   Patient {ACTIONS;DENIES/REPORTS:21021675::"Denies"} nocturnal pain.  Difficulty dressing/grooming: {ACTIONS;DENIES/REPORTS:21021675::"Denies"} Difficulty climbing stairs: {ACTIONS;DENIES/REPORTS:21021675::"Denies"} Difficulty getting out of chair: {ACTIONS;DENIES/REPORTS:21021675::"Denies"} Difficulty using hands for taps, buttons, cutlery, and/or writing: {ACTIONS;DENIES/REPORTS:21021675::"Denies"}  No Rheumatology ROS completed.   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   Anemia in pregnancy 12/07/2018   Language barrier 12/04/2018   History of cesarean delivery 12/04/2018   Ankylosing spondylitis ([place]) 11/11/2018   Uterine fibroid 12/11/2016    Past Medical History:  Diagnosis Date   Anemia    Ankylosing spondylitis ([place])    Ankylosing spondylitis ([facility])    Rheumatoid arthritis ([place])     Family History  Problem Relation Age of Onset   Arthritis/Rheumatoid Mother    Past Surgical History:  Procedure Laterality Date   CESAREAN SECTION     CESAREAN SECTION     CESAREAN SECTION N/A 05/03/2019   Procedure: CESAREAN SECTION;  Surgeon: Woodroe Mode, MD;  Location: [facility] LD ORS;  Service: Obstetrics;  Laterality: N/A;   Social History   Social History Narrative   Not on file   Immunization History  Administered Date(s) Administered   Influenza,inj,Quad PF,6+ Mos 02/22/2019   Janssen (J&J) SARS-COV-2 Vaccination 09/18/2019    PFIZER(Purple Top)SARS-COV-2 Vaccination 05/08/2020   Tdap 12/18/2017, 02/22/2019     Objective: Vital Signs: There were no vitals taken for this visit.   Physical Exam   Musculoskeletal Exam: ***  CDAI Exam: CDAI Score: -- Patient Global: --; Provider Global: -- Swollen: --; Tender: -- Joint Exam 02/18/2022   No joint exam has been documented for this visit   There is currently no information documented on the homunculus. Go to the Rheumatology activity and complete the homunculus joint exam.  Investigation: No additional findings.  Imaging: No results found.  Recent Labs: Lab Results  Component Value Date   WBC 6.2 12/05/2021   HGB 11.6 (L) 12/05/2021   PLT 326 12/05/2021   NA 138 12/05/2021   K 3.7 12/05/2021   CL 107 12/05/2021   CO2 25 12/05/2021   GLUCOSE 114 (H) 12/05/2021   BUN 5 (L) 12/05/2021   CREATININE 0.52 12/05/2021   BILITOT 0.3 12/05/2021   ALKPHOS 29 (L) 12/05/2021   AST 13 (L) 12/05/2021   ALT 11 12/05/2021   PROT 7.2 12/05/2021   ALBUMIN 3.7 12/05/2021   CALCIUM 8.7 (L) 12/05/2021   GFRAA 144 10/25/2020   QFTBGOLDPLUS NEGATIVE 06/28/2021    Speciality Comments: No specialty comments available.  Procedures:  No procedures performed Allergies: Patient has no known allergies.   Assessment / Plan:     Visit Diagnoses: No diagnosis found.  Orders: No orders of the defined types were placed in this encounter.  No orders of the defined types were placed in this encounter.   Face-to-face time spent with patient was *** minutes. Greater  than 50% of time was spent in counseling and coordination of care.  Follow-Up Instructions: No follow-ups on file.   Earnestine Mealing, CMA  Note - This record has been created using Editor, commissioning.  Chart creation errors have been sought, but may not always  have been located. Such creation errors do not reflect on  the standard of medical care.

## 2022-02-05 ENCOUNTER — Other Ambulatory Visit (HOSPITAL_COMMUNITY): Payer: Self-pay

## 2022-02-05 MED ORDER — CIMZIA 2 X 200 MG/ML ~~LOC~~ PSKT
400.0000 mg | PREFILLED_SYRINGE | SUBCUTANEOUS | 2 refills | Status: DC
  Filled 2022-02-05: qty 1, 28d supply, fill #0

## 2022-02-05 NOTE — Telephone Encounter (Signed)
FYI-Patient has new insurance, will need to restart PA process. Patient is past due for her next injection. Not sure if office could provide a sample to patient.

## 2022-02-05 NOTE — Telephone Encounter (Signed)
Submitted an expedited Prior Authorization request to [employer] for [employer] kit via CoverMyMeds. Will update once we receive a response.   KeyRetia Passe - PA Case ID: [id number]

## 2022-02-05 NOTE — Telephone Encounter (Signed)
Called [facility], they advised patient's shipment is on hold due to insurance.Provided [employer] info, rep processed and advised that plan termed on 01/21/22. Kathleen Nguyen states she thought plan change would happen on  02/22/22, she has not received a new insurance card. Ran eligibility check, found [employer] coverage.  Will restart benefits investigation.

## 2022-02-05 NOTE — Telephone Encounter (Signed)
Next Visit: 02/18/2022  Last Visit: 12/28/2021  Last Fill: 01/17/2022  DX:Ankylosing spondylitis of lumbosacral region  Current Dose per office note 01/17/2022: Cimzia 444m at Week 0 (administered in clinic today), Week 2 (01/31/22), Week 4 (02/14/22), then 4054mSQ every 4 weeks thereafter (starting 03/14/22).  Labs: 12/05/2021 Glucose 114, BUN 5, Calcium 8.7, AST 13, Alk. Phos. 29, Hgb 11.6, Hct 35.1  TB Gold: 06/28/2021 Neg    Okay to refill Cimzia?

## 2022-02-05 NOTE — Telephone Encounter (Signed)
Received notification from [facility] regarding a prior authorization for [employer]. Authorization has been APPROVED from 02/05/22 to 02/05/23.   Per test claim, copay for 28 days supply is $4.00  Patient can fill through [facility]: [phone]   Authorization # Key: [id number] - PA Case ID: [id number]  Please send rx to [facility].   Thanks!

## 2022-02-06 ENCOUNTER — Other Ambulatory Visit (HOSPITAL_COMMUNITY): Payer: Self-pay

## 2022-02-06 MED ORDER — CIMZIA STARTER KIT 6 X 200 MG/ML ~~LOC~~ PSKT
PREFILLED_SYRINGE | SUBCUTANEOUS | 0 refills | Status: DC
  Filled 2022-02-06: qty 1, fill #0
  Filled 2022-02-06: qty 3, 28d supply, fill #0

## 2022-02-06 NOTE — Telephone Encounter (Signed)
Rx for Cimzia starter kit sent to [facility] instead of maintenance dose as patient has not completed loading dose.  Knox Saliva, PharmD, MPH, BCPS, CPP Clinical Pharmacist (Rheumatology and Pulmonology)

## 2022-02-07 ENCOUNTER — Other Ambulatory Visit (HOSPITAL_COMMUNITY): Payer: Self-pay

## 2022-02-08 ENCOUNTER — Other Ambulatory Visit (HOSPITAL_COMMUNITY): Payer: Self-pay

## 2022-02-18 ENCOUNTER — Ambulatory Visit: Payer: 59 | Admitting: Physician Assistant

## 2022-02-18 DIAGNOSIS — M461 Sacroiliitis, not elsewhere classified: Secondary | ICD-10-CM

## 2022-02-18 DIAGNOSIS — H04123 Dry eye syndrome of bilateral lacrimal glands: Secondary | ICD-10-CM

## 2022-02-18 DIAGNOSIS — M457 Ankylosing spondylitis of lumbosacral region: Secondary | ICD-10-CM

## 2022-02-18 DIAGNOSIS — Z789 Other specified health status: Secondary | ICD-10-CM

## 2022-02-18 DIAGNOSIS — M25462 Effusion, left knee: Secondary | ICD-10-CM

## 2022-02-18 DIAGNOSIS — Z79899 Other long term (current) drug therapy: Secondary | ICD-10-CM

## 2022-02-18 DIAGNOSIS — D269 Other benign neoplasm of uterus, unspecified: Secondary | ICD-10-CM

## 2022-02-18 NOTE — Progress Notes (Unsigned)
Office Visit Note  Patient: Kathleen Nguyen             Date of Birth: 06/05/1986           MRN: [id number]             PCP: Kerin Perna, NP Referring: Kerin Perna, NP Visit Date: 02/21/2022 Occupation: '@GUAROCC'$ @  Subjective:  No chief complaint on file.   History of Present Illness: Kathleen Nguyen is a 36 y.o. female ***   Activities of Daily Living:  Patient reports morning stiffness for *** {minute/hour:19697}.   Patient {ACTIONS;DENIES/REPORTS:21021675::"Denies"} nocturnal pain.  Difficulty dressing/grooming: {ACTIONS;DENIES/REPORTS:21021675::"Denies"} Difficulty climbing stairs: {ACTIONS;DENIES/REPORTS:21021675::"Denies"} Difficulty getting out of chair: {ACTIONS;DENIES/REPORTS:21021675::"Denies"} Difficulty using hands for taps, buttons, cutlery, and/or writing: {ACTIONS;DENIES/REPORTS:21021675::"Denies"}  No Rheumatology ROS completed.   PMFS History:  Patient Active Problem List   Diagnosis Date Noted  . Abnormal uterine bleeding (AUB) 12/22/2020  . 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 Anemia in pregnancy 12/07/2018  . Language barrier 12/04/2018  . History of cesarean delivery 12/04/2018  . Ankylosing spondylitis ([place]) 11/11/2018  . Uterine fibroid 12/11/2016    Past Medical History:  Diagnosis Date  . Anemia   . Ankylosing spondylitis ([place])   . Ankylosing spondylitis ([place])   . Rheumatoid arthritis ([place])     Family History  Problem Relation Age of Onset  . Arthritis/Rheumatoid Mother    Past Surgical History:  Procedure Laterality Date  . CESAREAN SECTION    . CESAREAN SECTION    . CESAREAN SECTION N/A 05/03/2019   Procedure: CESAREAN SECTION;  Surgeon: Woodroe Mode, MD;  Location: [facility] LD ORS;  Service: Obstetrics;  Laterality: N/A;   Social History   Social History Narrative  . Not on file   Immunization History  Administered Date(s) Administered  . Influenza,inj,Quad PF,6+ Mos 02/22/2019  . Janssen (J&J) SARS-COV-2  Vaccination 09/18/2019  . PFIZER(Purple Top)SARS-COV-2 Vaccination 05/08/2020  . Tdap 12/18/2017, 02/22/2019     Objective: Vital Signs: There were no vitals taken for this visit.   Physical Exam   Musculoskeletal Exam: ***  CDAI Exam: CDAI Score: -- Patient Global: --; Provider Global: -- Swollen: --; Tender: -- Joint Exam 02/21/2022   No joint exam has been documented for this visit   There is currently no information documented on the homunculus. Go to the Rheumatology activity and complete the homunculus joint exam.  Investigation: No additional findings.  Imaging: No results found.  Recent Labs: Lab Results  Component Value Date   WBC 6.2 12/05/2021   HGB 11.6 (L) 12/05/2021   PLT 326 12/05/2021   NA 138 12/05/2021   K 3.7 12/05/2021   CL 107 12/05/2021   CO2 25 12/05/2021   GLUCOSE 114 (H) 12/05/2021   BUN 5 (L) 12/05/2021   CREATININE 0.52 12/05/2021   BILITOT 0.3 12/05/2021   ALKPHOS 29 (L) 12/05/2021   AST 13 (L) 12/05/2021   ALT 11 12/05/2021   PROT 7.2 12/05/2021   ALBUMIN 3.7 12/05/2021   CALCIUM 8.7 (L) 12/05/2021   GFRAA 144 10/25/2020   QFTBGOLDPLUS NEGATIVE 06/28/2021    Speciality Comments: No specialty comments available.  Procedures:  No procedures performed Allergies: Patient has no known allergies.   Assessment / Plan:     Visit Diagnoses: No diagnosis found.  Orders: No orders of the defined types were placed in this encounter.  No orders of the defined types were placed in this encounter.   Face-to-face time spent with patient was *** minutes. Greater  than 50% of time was spent in counseling and coordination of care.  Follow-Up Instructions: No follow-ups on file.   Earnestine Mealing, CMA  Note - This record has been created using Editor, commissioning.  Chart creation errors have been sought, but may not always  have been located. Such creation errors do not reflect on  the standard of medical care.

## 2022-02-21 ENCOUNTER — Ambulatory Visit: Payer: Medicaid Other | Attending: Physician Assistant | Admitting: Physician Assistant

## 2022-02-21 ENCOUNTER — Encounter: Payer: Self-pay | Admitting: Physician Assistant

## 2022-02-21 VITALS — BP 117/75 | HR 86 | Ht 65.0 in | Wt 181.6 lb

## 2022-02-21 DIAGNOSIS — M25462 Effusion, left knee: Secondary | ICD-10-CM | POA: Diagnosis not present

## 2022-02-21 DIAGNOSIS — M461 Sacroiliitis, not elsewhere classified: Secondary | ICD-10-CM

## 2022-02-21 DIAGNOSIS — Z789 Other specified health status: Secondary | ICD-10-CM

## 2022-02-21 DIAGNOSIS — M457 Ankylosing spondylitis of lumbosacral region: Secondary | ICD-10-CM | POA: Diagnosis not present

## 2022-02-21 DIAGNOSIS — Z79899 Other long term (current) drug therapy: Secondary | ICD-10-CM

## 2022-02-21 DIAGNOSIS — D269 Other benign neoplasm of uterus, unspecified: Secondary | ICD-10-CM

## 2022-02-21 DIAGNOSIS — H04123 Dry eye syndrome of bilateral lacrimal glands: Secondary | ICD-10-CM

## 2022-02-21 NOTE — Progress Notes (Signed)
Patient completed Cimzia doses on 01/17/22, 01/31/22, 02/15/22. She has completed loading dose.  She would prefer to take two injections every 4 weeks.  She is next due on 03/15/22.  We set indefinite phone reminder in her phone today for dose due dates and she will get a reminder at 8pm every evening that she is due for injection  Knox Saliva, PharmD, MPH, BCPS, CPP Clinical Pharmacist (Rheumatology and Pulmonology)

## 2022-02-21 NOTE — Patient Instructions (Signed)
Standing Labs We placed an order today for your standing lab work.   Please have your standing labs drawn in December and every 3 months   If possible, please have your labs drawn 2 weeks prior to your appointment so that the provider can discuss your results at your appointment.  Please note that you may see your imaging and lab results in [facility] before we have reviewed them. We may be awaiting multiple results to interpret others before contacting you. Please allow our office up to 72 hours to thoroughly review all of the results before contacting the office for clarification of your results.  We currently have open lab daily: Monday through Thursday from 1:30 PM-4:30 PM and Friday from 1:30 PM- 4:00 PM If possible, please come for your lab work on Monday, Thursday or Friday afternoons, as you may experience shorter wait times.   Effective April 24, 2022 the new lab hours will change to: Monday through Thursday from 1:30 PM-5:00 PM and Friday from 8:30 AM-12:00 PM If possible, please come for your lab work on Monday and Thursday afternoons, as you may experience shorter wait times.  Please be advised, all patients with office appointments requiring lab work will take precedent over walk-in lab work.    The office is located at [address] [place], [place], [place] [address] No appointment is necessary.   Labs are drawn by [facility]. Please bring your co-pay at the time of your lab draw.  You may receive a bill from [facility] for your lab work.  Please note if you are on Hydroxychloroquine and and an order has been placed for a Hydroxychloroquine level, you will need to have it drawn 4 hours or more after your last dose.  If you wish to have your labs drawn at another location, please call the office 24 hours in advance to send orders.  If you have any questions regarding directions or hours of operation,  please call [phone].   As a reminder, please drink plenty of water prior  to coming for your lab work. Thanks!   If you have signs or symptoms of an infection or start antibiotics: First, call your PCP for workup of your infection. Hold your medication through the infection, until you complete your antibiotics, and until symptoms resolve if you take the following: Injectable medication (Actemra, Benlysta, Cimzia, Cosentyx, Enbrel, Humira, Kevzara, Orencia, Remicade, Simponi, Stelara, Taltz, Tremfya) Methotrexate Leflunomide (Arava) Mycophenolate (Cellcept) Morrie Sheldon, Olumiant, or Rinvoq Vaccines You are taking a medication(s) that can suppress your immune system.  The following immunizations are recommended: Flu annually Covid-19  Td/Tdap (tetanus, diphtheria, pertussis) every 10 years Pneumonia (Prevnar 15 then Pneumovax 23 at least 1 year apart.  Alternatively, can take Prevnar 20 without needing additional dose) Shingrix: 2 doses from 4 weeks to 6 months apart  Please check with your PCP to make sure you are up to date.

## 2022-02-26 ENCOUNTER — Other Ambulatory Visit: Payer: Self-pay | Admitting: Physician Assistant

## 2022-02-26 ENCOUNTER — Other Ambulatory Visit (HOSPITAL_COMMUNITY): Payer: Self-pay

## 2022-02-26 DIAGNOSIS — Z79899 Other long term (current) drug therapy: Secondary | ICD-10-CM

## 2022-02-26 DIAGNOSIS — M457 Ankylosing spondylitis of lumbosacral region: Secondary | ICD-10-CM

## 2022-02-27 ENCOUNTER — Other Ambulatory Visit: Payer: Self-pay | Admitting: Physician Assistant

## 2022-02-27 ENCOUNTER — Other Ambulatory Visit (HOSPITAL_COMMUNITY): Payer: Self-pay

## 2022-02-27 DIAGNOSIS — Z79899 Other long term (current) drug therapy: Secondary | ICD-10-CM

## 2022-02-27 DIAGNOSIS — M457 Ankylosing spondylitis of lumbosacral region: Secondary | ICD-10-CM

## 2022-02-27 MED ORDER — CIMZIA STARTER KIT 6 X 200 MG/ML ~~LOC~~ PSKT
400.0000 mg | PREFILLED_SYRINGE | SUBCUTANEOUS | 0 refills | Status: DC
  Filled 2022-02-27: qty 3, fill #0

## 2022-02-27 MED ORDER — CIMZIA 2 X 200 MG ~~LOC~~ KIT
400.0000 mg | PACK | SUBCUTANEOUS | 0 refills | Status: DC
  Filled 2022-02-27: qty 1, 28d supply, fill #0

## 2022-02-27 NOTE — Addendum Note (Signed)
Addended by: Cassandria Anger on: 02/27/2022 08:58 AM   Modules accepted: Orders

## 2022-02-27 NOTE — Telephone Encounter (Signed)
Next Visit: 05/24/2022  Last Visit: 02/21/2022  Last Fill: 02/06/2022 (Loading Dose)  DX: Ankylosing spondylitis of lumbosacral region   Current Dose per office note 02/21/2022: Cimzia 400 mg sq injections every 4 weeks  Labs: 12/05/2021 Hgb 11.6. Hct 35.1, Glucose 114, BUN 5, calcium 8.7, AST 13, Alk. Phos 29  TB Gold: 06/28/2021 Neg    Okay to refill Cimzia?

## 2022-03-08 ENCOUNTER — Other Ambulatory Visit: Payer: Self-pay | Admitting: Family Medicine

## 2022-03-08 DIAGNOSIS — N939 Abnormal uterine and vaginal bleeding, unspecified: Secondary | ICD-10-CM

## 2022-03-11 ENCOUNTER — Ambulatory Visit: Payer: 59 | Admitting: Dermatology

## 2022-03-18 ENCOUNTER — Other Ambulatory Visit (HOSPITAL_COMMUNITY): Payer: Self-pay

## 2022-03-18 ENCOUNTER — Telehealth: Payer: Self-pay | Admitting: Pharmacy Technician

## 2022-03-18 NOTE — Telephone Encounter (Signed)
Patient Advocate Encounter  Prior Authorization for [facility] has been approved.    PA# [id number] Effective dates: 9.25.23 through 9.24.24  Keianna Signer B. CPhT P: [phone] F: [phone]   Received notification that prior authorization for [facility] is required.   PA submitted on 9.25.23 Key [id number] Status is pending    Luciano Cutter, CPhT Patient Advocate Phone: [phone]

## 2022-03-18 NOTE — Telephone Encounter (Signed)
error 

## 2022-03-19 ENCOUNTER — Other Ambulatory Visit (HOSPITAL_COMMUNITY): Payer: Self-pay

## 2022-03-27 ENCOUNTER — Other Ambulatory Visit: Payer: Self-pay | Admitting: *Deleted

## 2022-03-27 DIAGNOSIS — Z79899 Other long term (current) drug therapy: Secondary | ICD-10-CM

## 2022-03-28 ENCOUNTER — Telehealth: Payer: Self-pay | Admitting: Family Medicine

## 2022-03-28 LAB — COMPLETE METABOLIC PANEL WITH GFR
AG Ratio: 1.4 (calc) (ref 1.0–2.5)
ALT: 11 U/L (ref 6–29)
AST: 12 U/L (ref 10–30)
Albumin: 4.2 g/dL (ref 3.6–5.1)
Alkaline phosphatase (APISO): 35 U/L (ref 31–125)
BUN: 11 mg/dL (ref 7–25)
CO2: 29 mmol/L (ref 20–32)
Calcium: 8.9 mg/dL (ref 8.6–10.2)
Chloride: 103 mmol/L (ref 98–110)
Creat: 0.57 mg/dL (ref 0.50–0.97)
Globulin: 3 g/dL (calc) (ref 1.9–3.7)
Glucose, Bld: 81 mg/dL (ref 65–99)
Potassium: 4.1 mmol/L (ref 3.5–5.3)
Sodium: 138 mmol/L (ref 135–146)
Total Bilirubin: 0.4 mg/dL (ref 0.2–1.2)
Total Protein: 7.2 g/dL (ref 6.1–8.1)
eGFR: 121 mL/min/{1.73_m2} (ref >=60)

## 2022-03-28 LAB — HEPATIC FUNCTION PANEL
AG Ratio: 1.4 (calc) (ref 1.0–2.5)
ALT: 11 U/L (ref 6–29)
AST: 12 U/L (ref 10–30)
Albumin: 4.2 g/dL (ref 3.6–5.1)
Alkaline phosphatase (APISO): 35 U/L (ref 31–125)
Bilirubin, Direct: 0.1 mg/dL (ref 0.0–0.2)
Globulin: 3 g/dL (calc) (ref 1.9–3.7)
Indirect Bilirubin: 0.3 mg/dL (calc) (ref 0.2–1.2)
Total Bilirubin: 0.4 mg/dL (ref 0.2–1.2)
Total Protein: 7.2 g/dL (ref 6.1–8.1)

## 2022-03-28 LAB — CBC WITH DIFFERENTIAL/PLATELET
Absolute Monocytes: 310 cells/uL (ref 200–950)
Basophils Absolute: 10 cells/uL (ref 0–200)
Basophils Relative: 0.2 %
Eosinophils Absolute: 40 cells/uL (ref 15–500)
Eosinophils Relative: 0.8 %
HCT: 35.7 % (ref 35.0–45.0)
Hemoglobin: 11.6 g/dL — ABNORMAL LOW (ref 11.7–15.5)
Lymphs Abs: 2020 cells/uL (ref 850–3900)
MCH: 28.2 pg (ref 27.0–33.0)
MCHC: 32.5 g/dL (ref 32.0–36.0)
MCV: 86.7 fL (ref 80.0–100.0)
MPV: 10 fL (ref 7.5–12.5)
Monocytes Relative: 6.2 %
Neutro Abs: 2620 cells/uL (ref 1500–7800)
Neutrophils Relative %: 52.4 %
Platelets: 288 10*3/uL (ref 140–400)
RBC: 4.12 10*6/uL (ref 3.80–5.10)
RDW: 12.5 % (ref 11.0–15.0)
Total Lymphocyte: 40.4 %
WBC: 5 10*3/uL (ref 3.8–10.8)

## 2022-03-28 NOTE — Telephone Encounter (Signed)
Patient requesting a RX for medication for her Fibroids

## 2022-03-28 NOTE — Progress Notes (Signed)
CMP WNL.  Hgb is borderline low but stable. Rest of CBC WNL.

## 2022-04-02 MED ORDER — TRANEXAMIC ACID 650 MG PO TABS: 1300.0000 mg | ORAL_TABLET | Freq: Three times a day (TID) | ORAL | 3 refills | Status: AC

## 2022-04-02 NOTE — Telephone Encounter (Signed)
I called Kathleen Nguyen with [facility]  [phone]. I informed her I am returning her call about RX refill for medication for fibroids and wanted to ask which medication she is needing a refill for. She states she does not know the name- she states she never got it because the pharmacy said there is an issue. She states it was ordered a few months ago. I informed her there are several meds but that I will call the pharmacy and see if we can determine what the medication is and what the issue is.  She voices understanding. I called [facility] and spoke with the pharmacist. He determined she had never picked up the Myfembree because it is not covered by her insurance and that the doctor had discontinued the Myfembree and sent in a new Rx for Transexamic acid ( Lysteda ) that should be a lower cost alternative but that is also is not covered by her insurance. He states that with [employer] her [facility] who has an affiliation with [employer] and we could transfer to their. I called Kathleen Nguyen with [facility] [phone] and left a message I have sent the prescription we discussed to a new pharmacy [facility] [address] and she can pick it up after 3pm today.  Staci Acosta

## 2022-04-04 ENCOUNTER — Ambulatory Visit: Payer: 59 | Admitting: Rheumatology

## 2022-04-09 ENCOUNTER — Other Ambulatory Visit: Payer: Self-pay | Admitting: Physician Assistant

## 2022-04-09 ENCOUNTER — Other Ambulatory Visit (HOSPITAL_COMMUNITY): Payer: Self-pay

## 2022-04-09 DIAGNOSIS — Z79899 Other long term (current) drug therapy: Secondary | ICD-10-CM

## 2022-04-09 DIAGNOSIS — M457 Ankylosing spondylitis of lumbosacral region: Secondary | ICD-10-CM

## 2022-04-09 MED ORDER — CIMZIA 2 X 200 MG ~~LOC~~ KIT
400.0000 mg | PACK | SUBCUTANEOUS | 0 refills | Status: DC
  Filled 2022-04-09: qty 1, 28d supply, fill #0
  Filled 2022-05-01: qty 1, 28d supply, fill #1

## 2022-04-09 NOTE — Telephone Encounter (Signed)
Next Visit: 05/24/2022  Last Visit: 02/21/2022  Last Fill: 02/27/2022  DX:Ankylosing spondylitis of lumbosacral region   Current Dose per office note 02/21/2022: Cimzia 400 mg sq injections every 4 weeks  Labs: 03/27/2022 CMP WNL.  Hgb is borderline low but stable. Rest of CBC WNL.    TB Gold: 06/28/2021 Negative   Okay to refill Cimzia?

## 2022-04-10 ENCOUNTER — Other Ambulatory Visit (HOSPITAL_COMMUNITY): Payer: Self-pay

## 2022-04-11 ENCOUNTER — Other Ambulatory Visit (HOSPITAL_COMMUNITY): Payer: Self-pay

## 2022-05-01 ENCOUNTER — Other Ambulatory Visit (HOSPITAL_COMMUNITY): Payer: Self-pay

## 2022-05-06 ENCOUNTER — Other Ambulatory Visit (HOSPITAL_COMMUNITY): Payer: Self-pay

## 2022-05-10 ENCOUNTER — Telehealth: Payer: Self-pay | Admitting: Rheumatology

## 2022-05-10 ENCOUNTER — Other Ambulatory Visit (HOSPITAL_COMMUNITY): Payer: Self-pay

## 2022-05-10 DIAGNOSIS — M457 Ankylosing spondylitis of lumbosacral region: Secondary | ICD-10-CM

## 2022-05-10 MED ORDER — CIMZIA 2 X 200 MG/ML ~~LOC~~ PSKT
400.0000 mg | PREFILLED_SYRINGE | SUBCUTANEOUS | 2 refills | Status: DC
  Filled 2022-05-10 (×2): qty 1, 28d supply, fill #0
  Filled 2022-05-29 – 2022-06-07 (×2): qty 1, 28d supply, fill #1
  Filled 2022-07-05 – 2022-08-08 (×2): qty 1, 28d supply, fill #2

## 2022-05-10 NOTE — Telephone Encounter (Signed)
Reached out to [place], Jacqulyn Bath, at [facility] for assistance on this  Kathleen Nguyen, PharmD, MPH, BCPS, CPP Clinical Pharmacist (Rheumatology and Pulmonology)

## 2022-05-10 NOTE — Telephone Encounter (Signed)
Per [facility], patient received two incorrect doses (October and November 2023 shipment)  The order that was sent to [facility] was for incorrect NDC.  Resent for correct NDC.  Knox Saliva, PharmD, MPH, BCPS, CPP Clinical Pharmacist (Rheumatology and Pulmonology)

## 2022-05-10 NOTE — Telephone Encounter (Signed)
Patient walked into the office today 05/20/22 with questions regarding her Cimzia.

## 2022-05-10 NOTE — Telephone Encounter (Signed)
Kathleen Nguyen, at [facility] spoke with patient. Patient did not use October or November shipments and had a sample on hand that she used. Patient to stop by [facility] (per patient's request) on Monday to exchange with PFS kits. Pharmacy address emailed.  Knox Saliva, PharmD, MPH, BCPS, CPP Clinical Pharmacist (Rheumatology and Pulmonology)

## 2022-05-10 NOTE — Telephone Encounter (Signed)
Patient stopped by the office as she received her Cimzia at home. Patient received the Cimzia kit that has to be reconstituted. She normally receives the prefilled syringe. Patient is concerned about what she should do.  Please advise.

## 2022-05-10 NOTE — Progress Notes (Deleted)
Office Visit Note  Patient: Kathleen Nguyen             Date of Birth: [birth year]           MRN: [id number]             PCP: Kerin Perna, NP Referring: Kerin Perna, NP Visit Date: 05/24/2022 Occupation: '@GUAROCC'$ @  Subjective:    History of Present Illness: Kathleen Nguyen is a 36 y.o. female with history of ankylosing spondylitis.  She remains on Cimzia 400 mg sq injections every 4 weeks.   CBC and CMP updated on 03/27/22. Her next lab work will be due in January and every 3 months. Standing orders for CBC and CMP were placed today.  TB gold negative 06/28/21.  Discussed the importance of holding cimzia if she develops signs or symptoms of an infection and to resume once the infection has completely cleared.    Activities of Daily Living:  Patient reports morning stiffness for *** {minute/hour:19697}.   Patient {ACTIONS;DENIES/REPORTS:21021675::"Denies"} nocturnal pain.  Difficulty dressing/grooming: {ACTIONS;DENIES/REPORTS:21021675::"Denies"} Difficulty climbing stairs: {ACTIONS;DENIES/REPORTS:21021675::"Denies"} Difficulty getting out of chair: {ACTIONS;DENIES/REPORTS:21021675::"Denies"} Difficulty using hands for taps, buttons, cutlery, and/or writing: {ACTIONS;DENIES/REPORTS:21021675::"Denies"}  No Rheumatology ROS completed.   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   Anemia in pregnancy 12/07/2018   Language barrier 12/04/2018   History of cesarean delivery 12/04/2018   Ankylosing spondylitis ([place]) 11/11/2018   Uterine fibroid 12/11/2016    Past Medical History:  Diagnosis Date   Anemia    Ankylosing spondylitis ([place])    Ankylosing spondylitis ([facility])    Rheumatoid arthritis ([place])     Family History  Problem Relation Age of Onset   Arthritis/Rheumatoid Mother    Past Surgical History:  Procedure Laterality Date   CESAREAN SECTION      CESAREAN SECTION     CESAREAN SECTION N/A 05/03/2019   Procedure: CESAREAN SECTION;  Surgeon: Woodroe Mode, MD;  Location: [facility] LD [facility]S;  Service: Obstetrics;  Laterality: N/A;   Social History   Social History Narrative   Not on file   Immunization History  Administered Date(s) Administered   Influenza,inj,Quad PF,6+ Mos 02/22/2019   Janssen (J&J) SARS-COV-2 Vaccination 09/18/2019   PFIZER(Purple Top)SARS-COV-2 Vaccination 05/08/2020   Tdap 12/18/2017, 02/22/2019     Objective: Vital Signs: There were no vitals taken for this visit.   Physical Exam Vitals and nursing note reviewed.  Constitutional:      Appearance: She is well-developed.  HENT:     Head: Normocephalic and atraumatic.  Eyes:     Conjunctiva/sclera: Conjunctivae normal.  Cardiovascular:     Rate and Rhythm: Normal rate and regular rhythm.     Heart sounds: Normal heart sounds.  Pulmonary:     Effort: Pulmonary effort is normal.     Breath sounds: Normal breath sounds.  Abdominal:     General: Bowel sounds are normal.     Palpations: Abdomen is soft.  Musculoskeletal:     Cervical back: Normal range of motion.  Skin:    General: Skin is warm and dry.     Capillary Refill: Capillary refill takes less than 2 seconds.  Neurological:     Mental Status: She is alert and oriented to person, place, and time.  Psychiatric:        Behavior: Behavior normal.      Musculoskeletal Exam: ***  CDAI Exam: CDAI Score: --  Patient Global: --; Provider Global: -- Swollen: --; Tender: -- Joint Exam 05/24/2022   No joint exam has been documented for this visit   There is currently no information documented on the homunculus. Go to the Rheumatology activity and complete the homunculus joint exam.  Investigation: No additional findings.  Imaging: No results found.  Recent Labs: Lab Results  Component Value Date   WBC 5.0 03/27/2022   HGB 11.6 (L) 03/27/2022   PLT 288 03/27/2022   NA 138 03/27/2022   K  4.1 03/27/2022   CL 103 03/27/2022   CO2 29 03/27/2022   GLUCOSE 81 03/27/2022   BUN 11 03/27/2022   CREATININE 0.57 03/27/2022   BILITOT 0.4 03/27/2022   BILITOT 0.4 03/27/2022   ALKPHOS 29 (L) 12/05/2021   AST 12 03/27/2022   AST 12 03/27/2022   ALT 11 03/27/2022   ALT 11 03/27/2022   PROT 7.2 03/27/2022   PROT 7.2 03/27/2022   ALBUMIN 3.7 12/05/2021   CALCIUM 8.9 03/27/2022   GFRAA 144 10/25/2020   QFTBGOLDPLUS NEGATIVE 06/28/2021    Speciality Comments: No specialty comments available.  Procedures:  No procedures performed Allergies: Patient has no known allergies.   Assessment / Plan:     Visit Diagnoses: No diagnosis found.  Orders: No orders of the defined types were placed in this encounter.  No orders of the defined types were placed in this encounter.   Face-to-face time spent with patient was *** minutes. Greater than 50% of time was spent in counseling and coordination of care.  Follow-Up Instructions: No follow-ups on file.   Earnestine Mealing, CMA  Note - This record has been created using Editor, commissioning.  Chart creation errors have been sought, but may not always  have been located. Such creation errors do not reflect on  the standard of medical care.

## 2022-05-13 ENCOUNTER — Other Ambulatory Visit (HOSPITAL_COMMUNITY): Payer: Self-pay

## 2022-05-14 ENCOUNTER — Other Ambulatory Visit (HOSPITAL_COMMUNITY): Payer: Self-pay

## 2022-05-15 ENCOUNTER — Other Ambulatory Visit (HOSPITAL_COMMUNITY): Payer: Self-pay

## 2022-05-24 ENCOUNTER — Ambulatory Visit: Payer: Medicaid Other | Attending: Physician Assistant | Admitting: Physician Assistant

## 2022-05-24 DIAGNOSIS — M457 Ankylosing spondylitis of lumbosacral region: Secondary | ICD-10-CM

## 2022-05-24 DIAGNOSIS — H04123 Dry eye syndrome of bilateral lacrimal glands: Secondary | ICD-10-CM

## 2022-05-24 DIAGNOSIS — D269 Other benign neoplasm of uterus, unspecified: Secondary | ICD-10-CM

## 2022-05-24 DIAGNOSIS — Z79899 Other long term (current) drug therapy: Secondary | ICD-10-CM

## 2022-05-24 DIAGNOSIS — M25462 Effusion, left knee: Secondary | ICD-10-CM

## 2022-05-24 DIAGNOSIS — Z789 Other specified health status: Secondary | ICD-10-CM

## 2022-05-24 DIAGNOSIS — M461 Sacroiliitis, not elsewhere classified: Secondary | ICD-10-CM

## 2022-05-27 ENCOUNTER — Other Ambulatory Visit (HOSPITAL_COMMUNITY): Payer: Self-pay

## 2022-05-29 ENCOUNTER — Other Ambulatory Visit (HOSPITAL_COMMUNITY): Payer: Self-pay

## 2022-05-30 ENCOUNTER — Other Ambulatory Visit: Payer: Self-pay | Admitting: Family Medicine

## 2022-05-30 DIAGNOSIS — D269 Other benign neoplasm of uterus, unspecified: Secondary | ICD-10-CM

## 2022-06-07 ENCOUNTER — Other Ambulatory Visit: Payer: Self-pay

## 2022-06-07 ENCOUNTER — Other Ambulatory Visit (HOSPITAL_COMMUNITY): Payer: Self-pay

## 2022-06-07 DIAGNOSIS — D269 Other benign neoplasm of uterus, unspecified: Secondary | ICD-10-CM

## 2022-06-07 MED ORDER — DICLOFENAC SODIUM 75 MG PO TBEC: 75.0000 mg | DELAYED_RELEASE_TABLET | ORAL | 1 refills | Status: DC | PRN

## 2022-06-07 NOTE — Telephone Encounter (Signed)
Received fax from [facility] for Diclofenac Sodium. Routed encounter to Lamount Cohen MD for advisement.  Darlyne Russian, RN

## 2022-06-10 ENCOUNTER — Other Ambulatory Visit: Payer: Self-pay

## 2022-06-11 ENCOUNTER — Other Ambulatory Visit (HOSPITAL_COMMUNITY): Payer: Self-pay

## 2022-07-03 ENCOUNTER — Other Ambulatory Visit (HOSPITAL_COMMUNITY): Payer: Self-pay

## 2022-07-05 ENCOUNTER — Other Ambulatory Visit (HOSPITAL_COMMUNITY): Payer: Self-pay

## 2022-07-16 ENCOUNTER — Other Ambulatory Visit (HOSPITAL_COMMUNITY): Payer: Self-pay

## 2022-07-16 ENCOUNTER — Other Ambulatory Visit: Payer: Self-pay

## 2022-07-18 ENCOUNTER — Other Ambulatory Visit (HOSPITAL_COMMUNITY): Payer: Self-pay

## 2022-07-19 ENCOUNTER — Other Ambulatory Visit: Payer: Self-pay

## 2022-07-22 ENCOUNTER — Telehealth: Payer: Self-pay

## 2022-07-22 ENCOUNTER — Other Ambulatory Visit: Payer: Self-pay

## 2022-07-22 ENCOUNTER — Other Ambulatory Visit (HOSPITAL_COMMUNITY): Payer: Self-pay

## 2022-07-22 NOTE — Telephone Encounter (Signed)
Received notification from [facility] that a PA was needed for this medication. Per test claim, the insurance that we most recently received PA approval for on 03/18/22 is been terminated as of 06/24/22.  Submitted an URGENT Prior Authorization request to [facility] for CIMZIA via CoverMyMeds. Will update once we receive a response.  Key: [id number]

## 2022-07-23 ENCOUNTER — Other Ambulatory Visit (HOSPITAL_COMMUNITY): Payer: Self-pay

## 2022-07-24 ENCOUNTER — Other Ambulatory Visit: Payer: Self-pay

## 2022-07-24 ENCOUNTER — Other Ambulatory Visit (HOSPITAL_COMMUNITY): Payer: Self-pay

## 2022-07-25 NOTE — Telephone Encounter (Signed)
Received a notification on CMM regarding Prior Authorization from [facility] for Kathleen Nguyen. Authorization has been DENIED, however no further information has been provided. Will await denial letter to be faxed to office to learn clinical reasoning behind the adverse determination.

## 2022-07-25 NOTE — Telephone Encounter (Signed)
Received fax from [facility] with additional clinical questions. Completed form and faxed back to [facility] with clinicals  Fax: [phone] Phone: [phone], option 3, option 4, option 2 Case # [id number]  Knox Saliva, PharmD, MPH, BCPS, CPP Clinical Pharmacist (Rheumatology and Pulmonology)

## 2022-07-26 ENCOUNTER — Other Ambulatory Visit: Payer: Self-pay

## 2022-07-29 ENCOUNTER — Other Ambulatory Visit: Payer: Self-pay

## 2022-07-30 ENCOUNTER — Other Ambulatory Visit (HOSPITAL_COMMUNITY): Payer: Self-pay

## 2022-07-30 ENCOUNTER — Other Ambulatory Visit: Payer: Self-pay

## 2022-07-31 NOTE — Telephone Encounter (Signed)
Received a fax regarding Prior Authorization from [facility] for [place]. Authorization has been DENIED because patient must meet one of the following: 1) member has tried two anti-inflammatories (NSAIDs) for at least 4 weeks and it did not work 2) member has tried two different NSAIDs and could not tolerate them 3) member has medical reason why they cannot try ALL NSAIDs  Also must have one of the following: 1) tried TWO of the following for at least 3 months and they did not work or they could not tolerate them: adalimumab product, Cosentyx, Enbrel, Rinvoq, Xeljanz 2) medical reason why they cannot try ALL of the above medications 3) prescriber explains why ALL of the above medications are not clinically appropriate and provide complete list of all medications patient has tried in the past.  [employer]. They state additional clinicals can be faxed to [facility] Fax: [phone]. Compiled clinicals, chart notes, and NSAID history and faxed.  Case reference # [id number] Phone: [phone]  Knox Saliva, PharmD, MPH, BCPS, CPP Clinical Pharmacist (Rheumatology and Pulmonology)

## 2022-07-31 NOTE — Telephone Encounter (Signed)
Per last OV note, patient has 70% improvement since starting Cimzia. She is not using oral contraceptives and is still potentially family planning. Based on dispense history, patient's last use of OCP was August 2023. She used tranexamic acid through October 2023. Appears there were cost issues based on OBGYN notes.  Cimzia is safest option for her since she is not actively using contraception. Morrie Sheldon, Rinvoq, and Cosentyx are not studied in family planning. Enbrel and Humira and minimally studied whereas Cimzia is safe during family planning, through pregnancy, and lactation. Additionally this is continuation of treatment and it is not clinically appropriate to change treatment as effectiveness of other medications cannot be guaranteed. Changing treatment could lead to progression of disease and significant worsening of her quality of life.  Patient is currently taking naproxen and diclofenac. She has been taking diclofenac since 2019 when she saw ortho. She has taken celecoxib and ibuprofen in the past. None of these worked to manage her symptoms. She has sacroiliitis and NSAIDs are not as effective.  Knox Saliva, PharmD, MPH, BCPS, CPP Clinical Pharmacist (Rheumatology and Pulmonology)

## 2022-08-07 ENCOUNTER — Other Ambulatory Visit (HOSPITAL_COMMUNITY): Payer: Self-pay

## 2022-08-07 NOTE — Telephone Encounter (Signed)
Received notification from [facility] regarding a prior authorization for [facility]. Authorization has been APPROVED from 07/22/2022 to 07/21/2023. Approval letter sent to scan center.  Patient can continue to fill through [place]: [phone] . Per test claim, copay is $4 when Medicaid is processed as secondary. Email sent to [facility] to set up refill for patient.  Authorization # [id number]  Knox Saliva, PharmD, MPH, BCPS, CPP Clinical Pharmacist (Rheumatology and Pulmonology)

## 2022-08-08 ENCOUNTER — Other Ambulatory Visit: Payer: Self-pay

## 2022-08-08 ENCOUNTER — Other Ambulatory Visit (HOSPITAL_COMMUNITY): Payer: Self-pay

## 2022-08-09 ENCOUNTER — Other Ambulatory Visit: Payer: Self-pay

## 2022-08-12 ENCOUNTER — Other Ambulatory Visit (HOSPITAL_COMMUNITY): Payer: Self-pay

## 2022-08-12 ENCOUNTER — Other Ambulatory Visit: Payer: Self-pay

## 2022-08-18 ENCOUNTER — Other Ambulatory Visit: Payer: Self-pay | Admitting: Family Medicine

## 2022-08-18 DIAGNOSIS — D269 Other benign neoplasm of uterus, unspecified: Secondary | ICD-10-CM

## 2022-08-22 ENCOUNTER — Telehealth: Payer: Self-pay | Admitting: General Practice

## 2022-08-22 NOTE — Telephone Encounter (Signed)
Received refill request for diclofenac. Per chart review, patient was seen 12/2021 and was recommended to follow up in 4 months- no appointment has been made.   Called patient with pacific interpreter [phone], no answer- left message to call us back regarding a prescription request

## 2022-08-29 ENCOUNTER — Other Ambulatory Visit: Payer: Self-pay | Admitting: Rheumatology

## 2022-08-29 ENCOUNTER — Other Ambulatory Visit (HOSPITAL_COMMUNITY): Payer: Self-pay

## 2022-08-29 DIAGNOSIS — M457 Ankylosing spondylitis of lumbosacral region: Secondary | ICD-10-CM

## 2022-09-16 ENCOUNTER — Other Ambulatory Visit: Payer: Self-pay | Admitting: *Deleted

## 2022-09-16 DIAGNOSIS — Z79899 Other long term (current) drug therapy: Secondary | ICD-10-CM | POA: Diagnosis not present

## 2022-09-16 DIAGNOSIS — M457 Ankylosing spondylitis of lumbosacral region: Secondary | ICD-10-CM | POA: Diagnosis not present

## 2022-09-16 DIAGNOSIS — D269 Other benign neoplasm of uterus, unspecified: Secondary | ICD-10-CM

## 2022-09-16 DIAGNOSIS — M461 Sacroiliitis, not elsewhere classified: Secondary | ICD-10-CM

## 2022-09-17 NOTE — Progress Notes (Signed)
CBC WNL.  Alk phos is borderline low. Glucose is 109. Rest of CMP WNL. We will continue to monitor.

## 2022-09-18 LAB — COMPLETE METABOLIC PANEL WITH GFR
AG Ratio: 1.5 (calc) (ref 1.0–2.5)
ALT: 11 U/L (ref 6–29)
AST: 10 U/L (ref 10–30)
Albumin: 4.4 g/dL (ref 3.6–5.1)
Alkaline phosphatase (APISO): 29 U/L — ABNORMAL LOW (ref 31–125)
BUN: 14 mg/dL (ref 7–25)
CO2: 26 mmol/L (ref 20–32)
Calcium: 9.1 mg/dL (ref 8.6–10.2)
Chloride: 105 mmol/L (ref 98–110)
Creat: 0.61 mg/dL (ref 0.50–0.97)
Globulin: 3 g/dL (calc) (ref 1.9–3.7)
Glucose, Bld: 109 mg/dL — ABNORMAL HIGH (ref 65–99)
Potassium: 4.7 mmol/L (ref 3.5–5.3)
Sodium: 138 mmol/L (ref 135–146)
Total Bilirubin: 0.2 mg/dL (ref 0.2–1.2)
Total Protein: 7.4 g/dL (ref 6.1–8.1)
eGFR: 119 mL/min/{1.73_m2} (ref >=60)

## 2022-09-18 LAB — CBC WITH DIFFERENTIAL/PLATELET
Absolute Monocytes: 437 cells/uL (ref 200–950)
Basophils Absolute: 18 cells/uL (ref 0–200)
Basophils Relative: 0.4 %
Eosinophils Absolute: 28 cells/uL (ref 15–500)
Eosinophils Relative: 0.6 %
HCT: 36.8 % (ref 35.0–45.0)
Hemoglobin: 12.2 g/dL (ref 11.7–15.5)
Lymphs Abs: 2075 cells/uL (ref 850–3900)
MCH: 29 pg (ref 27.0–33.0)
MCHC: 33.2 g/dL (ref 32.0–36.0)
MCV: 87.4 fL (ref 80.0–100.0)
MPV: 10 fL (ref 7.5–12.5)
Monocytes Relative: 9.5 %
Neutro Abs: 2042 cells/uL (ref 1500–7800)
Neutrophils Relative %: 44.4 %
Platelets: 316 10*3/uL (ref 140–400)
RBC: 4.21 10*6/uL (ref 3.80–5.10)
RDW: 12.8 % (ref 11.0–15.0)
Total Lymphocyte: 45.1 %
WBC: 4.6 10*3/uL (ref 3.8–10.8)

## 2022-09-18 LAB — QUANTIFERON-TB GOLD PLUS
Mitogen-NIL: 8.31 IU/mL
NIL: 0.03 IU/mL
QuantiFERON-TB Gold Plus: NEGATIVE
TB1-NIL: 0 IU/mL
TB2-NIL: 0 IU/mL

## 2022-09-19 NOTE — Progress Notes (Signed)
TB gold negative

## 2022-09-23 NOTE — Progress Notes (Deleted)
Office Visit Note  Patient: Kathleen Nguyen             Date of Birth: 07-10-1985           MRN: [id number]             PCP: Grayce Sessions, NP Referring: Grayce Sessions, NP Visit Date: 10/07/2022 Occupation: @  Subjective:    History of Present Illness: Kathleen Nguyen is a 37 y.o. female with history of ankylosing spondylitis.  Patient is on cimzia 400 mg sq injections once monthly.  CBC and CMP updated on 09/16/22. Her next lab work will be due in July and every 3 months.  TB gold negative on 09/16/22. Discussed the importance of holding cimzia if she develops signs or symptom of an infection and to resume once the infection has completely cleared.   Activities of Daily Living:  Patient reports morning stiffness for *** {minute/hour:19697}.   Patient {ACTIONS;DENIES/REPORTS:21021675::"Denies"} nocturnal pain.  Difficulty dressing/grooming: {ACTIONS;DENIES/REPORTS:21021675::"Denies"} Difficulty climbing stairs: {ACTIONS;DENIES/REPORTS:21021675::"Denies"} Difficulty getting out of chair: {ACTIONS;DENIES/REPORTS:21021675::"Denies"} Difficulty using hands for taps, buttons, cutlery, and/or writing: {ACTIONS;DENIES/REPORTS:21021675::"Denies"}  No Rheumatology ROS completed.   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   Anemia in pregnancy 12/07/2018   Language barrier 12/04/2018   History of cesarean delivery 12/04/2018   Ankylosing spondylitis 11/11/2018   Uterine fibroid 12/11/2016    Past Medical History:  Diagnosis Date   Anemia    Ankylosing spondylitis ([facility])    Ankylosing spondylitis ([facility])    Rheumatoid arthritis ([facility])     Family History  Problem Relation Age of Onset   Arthritis/Rheumatoid Mother    Past Surgical History:  Procedure Laterality Date   CESAREAN SECTION     CESAREAN SECTION     CESAREAN SECTION N/A 05/03/2019   Procedure:  CESAREAN SECTION;  Surgeon: Adam Phenix, MD;  Location: [facility] LD ORS;  Service: Obstetrics;  Laterality: N/A;   Social History   Social History Narrative   Not on file   Immunization History  Administered Date(s) Administered   Influenza,inj,Quad PF,6+ Mos 02/22/2019   Janssen (J&J) SARS-COV-2 Vaccination 09/18/2019   PFIZER(Purple Top)SARS-COV-2 Vaccination 05/08/2020   Tdap 12/18/2017, 02/22/2019     Objective: Vital Signs: There were no vitals taken for this visit.   Physical Exam Vitals and nursing note reviewed.  Constitutional:      Appearance: She is well-developed.  HENT:     Head: Normocephalic and atraumatic.  Eyes:     Conjunctiva/sclera: Conjunctivae normal.  Cardiovascular:     Rate and Rhythm: Normal rate and regular rhythm.     Heart sounds: Normal heart sounds.  Pulmonary:     Effort: Pulmonary effort is normal.     Breath sounds: Normal breath sounds.  Abdominal:     General: Bowel sounds are normal.     Palpations: Abdomen is soft.  Musculoskeletal:     Cervical back: Normal range of motion.  Lymphadenopathy:     Cervical: No cervical adenopathy.  Skin:    General: Skin is warm and dry.     Capillary Refill: Capillary refill takes less than 2 seconds.  Neurological:     Mental Status: She is alert and oriented to person, place, and time.  Psychiatric:        Behavior: Behavior normal.      Musculoskeletal Exam: ***  CDAI Exam: CDAI Score: -- Patient Global: --;  Provider Global: -- Swollen: --; Tender: -- Joint Exam 10/07/2022   No joint exam has been documented for this visit   There is currently no information documented on the homunculus. Go to the Rheumatology activity and complete the homunculus joint exam.  Investigation: No additional findings.  Imaging: No results found.  Recent Labs: Lab Results  Component Value Date   WBC 4.6 09/16/2022   HGB 12.2 09/16/2022   PLT 316 09/16/2022   NA 138 09/16/2022   K 4.7  09/16/2022   CL 105 09/16/2022   CO2 26 09/16/2022   GLUCOSE 109 (H) 09/16/2022   BUN 14 09/16/2022   CREATININE 0.61 09/16/2022   BILITOT 0.2 09/16/2022   ALKPHOS 29 (L) 12/05/2021   AST 10 09/16/2022   ALT 11 09/16/2022   PROT 7.4 09/16/2022   ALBUMIN 3.7 12/05/2021   CALCIUM 9.1 09/16/2022   GFRAA 144 10/25/2020   QFTBGOLDPLUS NEGATIVE 09/16/2022    Speciality Comments: No specialty comments available.  Procedures:  No procedures performed Allergies: Patient has no known allergies.   Assessment / Plan:     Visit Diagnoses: Ankylosing spondylitis of lumbosacral region  High risk medication use  Sacroiliitis  Uterine adenomyoma  Effusion, left knee  Dry eyes  Language barrier  Orders: No orders of the defined types were placed in this encounter.  No orders of the defined types were placed in this encounter.   Face-to-face time spent with patient was *** minutes. Greater than 50% of time was spent in counseling and coordination of care.  Follow-Up Instructions: No follow-ups on file.   Gearldine Bienenstock, PA-C  Note - This record has been created using Dragon software.  Chart creation errors have been sought, but may not always  have been located. Such creation errors do not reflect on  the standard of medical care.

## 2022-10-01 ENCOUNTER — Ambulatory Visit (INDEPENDENT_AMBULATORY_CARE_PROVIDER_SITE_OTHER): Payer: Medicaid Other | Admitting: Family Medicine

## 2022-10-01 ENCOUNTER — Other Ambulatory Visit: Payer: Self-pay

## 2022-10-01 ENCOUNTER — Encounter: Payer: Self-pay | Admitting: Family Medicine

## 2022-10-01 VITALS — BP 106/72 | HR 82 | Ht 65.0 in | Wt 169.2 lb

## 2022-10-01 DIAGNOSIS — D269 Other benign neoplasm of uterus, unspecified: Secondary | ICD-10-CM | POA: Diagnosis not present

## 2022-10-01 DIAGNOSIS — N939 Abnormal uterine and vaginal bleeding, unspecified: Secondary | ICD-10-CM

## 2022-10-01 MED ORDER — MYFEMBREE 40-1-0.5 MG PO TABS
1.0000 | ORAL_TABLET | Freq: Every day | ORAL | 11 refills | Status: DC
Start: 2022-10-01 — End: 2023-04-23

## 2022-10-01 MED ORDER — DICLOFENAC SODIUM 75 MG PO TBEC: 75.0000 mg | DELAYED_RELEASE_TABLET | ORAL | 1 refills | Status: DC | PRN

## 2022-10-01 NOTE — Progress Notes (Unsigned)
GYNECOLOGY OFFICE VISIT NOTE  History:   Kathleen Nguyen is a 37 y.o. [phone] here today for DUB follow up.  Patient well known to me First saw on 12/22/20, had AUB and subsequently had EMB on 02/02/21 that had unremarkable pathology Seen again  Seen by me on 02/22/23 to discuss management options, elected for OCPs Seen again by me on 11/14/21, discussed how to run packs together to have no periods and referred to Dr. Donavan FoilBass for evaluation Saw Dr. Donavan FoilBass on 01/07/22, [facility] with plan to consider Sonata procedure vs hysterectomy  She is considering a hormonal IUD Has been prescribed Myfembree by Dr. Donavan FoilBass in the past, reports pain is somewhat improved but bleeding has not improved at all, she is taking it regularly She is wondering about taking opioids for pain management Voltaren also helps She is unsure about wanting a hysterectomy  There are no preventive care reminders to display for this patient.  Past Medical History:  Diagnosis Date   Anemia    Ankylosing spondylitis    Ankylosing spondylitis    Rheumatoid arthritis     Past Surgical History:  Procedure Laterality Date   CESAREAN SECTION     CESAREAN SECTION     CESAREAN SECTION N/A 05/03/2019   Procedure: CESAREAN SECTION;  Surgeon: Adam PhenixArnold, James G, MD;  Location: [facility] LD ORS;  Service: Obstetrics;  Laterality: N/A;    The following portions of the patient's history were reviewed and updated as appropriate: allergies, current medications, past family history, past medical history, past social history, past surgical history and problem list.   Health Maintenance:   Last pap: Lab Results  Component Value Date   DIAGPAP  02/02/2021    - Negative for intraepithelial lesion or malignancy (NILM)   HPV NOT DETECTED 01/13/2017   HPVHIGH Negative 02/02/2021     Last mammogram:  N/a    Review of Systems:  Pertinent items noted in HPI and remainder of comprehensive ROS otherwise negative.  Physical Exam:  BP  106/72   Pulse 82   Ht 5\' 5"  (1.651 m)   Wt 169 lb 3.2 oz (76.7 kg)   LMP 08/26/2022 (Exact Date)   BMI 28.16 kg/m  CONSTITUTIONAL: Well-developed, well-nourished female in no acute distress.  HEENT:  Normocephalic, atraumatic. External right and left ear normal. No scleral icterus.  NECK: Normal range of motion, supple, no masses noted on observation SKIN: No rash noted. Not diaphoretic. No erythema. No pallor. MUSCULOSKELETAL: Normal range of motion. No edema noted. NEUROLOGIC: Alert and oriented to person, place, and time. Normal muscle tone coordination.  PSYCHIATRIC: Normal mood and affect. Normal behavior. Normal judgment and thought content. RESPIRATORY: Effort normal, no problems with respiration noted   Labs and Imaging No results found for this or any previous visit (from the past 168 hour(s)). No results found.    Assessment and Plan:   Problem List Items Addressed This Visit       Genitourinary   Abnormal uterine bleeding (AUB)    Patient a little all over the place as to what she wants. At this point has had trial and basically failed OCP's/Myfembree. After more discussion she decided against hormonal IUD. On review of Dr. Donavan FoilBass' note it does not seem like Sonata would be a good option to address her symptoms. At this point I recommend referral to one of my Gyn partners for discussion of definitive treatment with hysterectomy, likely Dr. Briscoe DeutscherAjewole for robotic surgery eval given hx of prior pelvic  surgery. She is still unsure, wants to continue trying with Myfembree for now and will let us know.       Relevant Medications   diclofenac (VOLTAREN) 75 MG EC tablet   Relugolix-Estradiol-Norethind (MYFEMBREE) 40-1-0.5 MG TABS   Other Visit Diagnoses     Uterine adenomyoma    -  Primary   Relevant Medications   diclofenac (VOLTAREN) 75 MG EC tablet   Relugolix-Estradiol-Norethind (MYFEMBREE) 40-1-0.5 MG TABS       Total face-to-face time with patient: 20 minutes.  Over  50% of encounter was spent on counseling and coordination of care.   Venora Maples, MD/MPH Attending Family Medicine Physician, [facility] for [facility], [facility]

## 2022-10-03 ENCOUNTER — Telehealth: Payer: Self-pay | Admitting: *Deleted

## 2022-10-03 ENCOUNTER — Other Ambulatory Visit (HOSPITAL_COMMUNITY): Payer: Self-pay

## 2022-10-03 NOTE — Assessment & Plan Note (Signed)
Patient a little all over the place as to what she wants. At this point has had trial and basically failed OCP's/Myfembree. After more discussion she decided against hormonal IUD. On review of Dr. Donavan Foil' note it does not seem like Sonata would be a good option to address her symptoms. At this point I recommend referral to one of my Gyn partners for discussion of definitive treatment with hysterectomy, likely Dr. Briscoe Deutscher for robotic surgery eval given hx of prior pelvic surgery. She is still unsure, wants to continue trying with Myfembree for now and will let us know.

## 2022-10-03 NOTE — Telephone Encounter (Signed)
Received a voicemail from 10/02/22 pm stating they are calling from [facility] pharmacy calling to verify we have received prior authorization request for Myfembree. RX [phone]. I called and informed them we have received but it has not been completed yet and will be completed as soon as possible. Nancy Fetter

## 2022-10-07 ENCOUNTER — Ambulatory Visit: Payer: Medicaid Other | Admitting: Physician Assistant

## 2022-10-07 DIAGNOSIS — Z79899 Other long term (current) drug therapy: Secondary | ICD-10-CM

## 2022-10-07 DIAGNOSIS — H04123 Dry eye syndrome of bilateral lacrimal glands: Secondary | ICD-10-CM

## 2022-10-07 DIAGNOSIS — Z758 Other problems related to medical facilities and other health care: Secondary | ICD-10-CM

## 2022-10-07 DIAGNOSIS — M457 Ankylosing spondylitis of lumbosacral region: Secondary | ICD-10-CM

## 2022-10-07 DIAGNOSIS — M461 Sacroiliitis, not elsewhere classified: Secondary | ICD-10-CM

## 2022-10-07 DIAGNOSIS — M25462 Effusion, left knee: Secondary | ICD-10-CM

## 2022-10-07 DIAGNOSIS — D269 Other benign neoplasm of uterus, unspecified: Secondary | ICD-10-CM

## 2022-10-09 ENCOUNTER — Other Ambulatory Visit (HOSPITAL_COMMUNITY): Payer: Self-pay

## 2022-10-17 ENCOUNTER — Telehealth: Payer: Self-pay | Admitting: Lactation Services

## 2022-10-17 ENCOUNTER — Other Ambulatory Visit (HOSPITAL_COMMUNITY): Payer: Self-pay

## 2022-10-17 NOTE — Telephone Encounter (Signed)
Received approval for PA for Myfembree through Covermymeds.  Called Pharmacy to let them know PA was approved.   Called patient with [employer]preter, # [phone] to let her know medication has been approved and is ready at her Pharmacy. Patient did not answer, so above message was left. Asked patient to call the office back at [phone] with any questions or concerns.

## 2022-11-14 ENCOUNTER — Other Ambulatory Visit (HOSPITAL_COMMUNITY): Payer: Self-pay

## 2022-11-19 ENCOUNTER — Other Ambulatory Visit: Payer: Self-pay

## 2022-12-02 ENCOUNTER — Other Ambulatory Visit (HOSPITAL_COMMUNITY): Payer: Self-pay

## 2022-12-11 ENCOUNTER — Other Ambulatory Visit: Payer: Self-pay | Admitting: Family Medicine

## 2022-12-11 DIAGNOSIS — D269 Other benign neoplasm of uterus, unspecified: Secondary | ICD-10-CM

## 2022-12-11 DIAGNOSIS — N939 Abnormal uterine and vaginal bleeding, unspecified: Secondary | ICD-10-CM

## 2023-04-22 NOTE — Progress Notes (Unsigned)
Office Visit Note  Patient: Kathleen Nguyen             Date of Birth: 03-Dec-1985           MRN: [id number]             PCP: Grayce Sessions, NP Referring: Grayce Sessions, NP Visit Date: 04/23/2023 Occupation: @GUAROCC @  Subjective:  No chief complaint on file.   History of Present Illness: Kathleen Nguyen is a 37 y.o. female ***     Activities of Daily Living:  Patient reports morning stiffness for *** {minute/hour:19697}.   Patient {ACTIONS;DENIES/REPORTS:21021675::"Denies"} nocturnal pain.  Difficulty dressing/grooming: {ACTIONS;DENIES/REPORTS:21021675::"Denies"} Difficulty climbing stairs: {ACTIONS;DENIES/REPORTS:21021675::"Denies"} Difficulty getting out of chair: {ACTIONS;DENIES/REPORTS:21021675::"Denies"} Difficulty using hands for taps, buttons, cutlery, and/or writing: {ACTIONS;DENIES/REPORTS:21021675::"Denies"}  No Rheumatology ROS completed.   PMFS History:  Patient Active Problem List   Diagnosis Date Noted  . Abnormal uterine bleeding (AUB) 12/22/2020  . 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 Anemia in pregnancy 12/07/2018  . Language barrier 12/04/2018  . History of cesarean delivery 12/04/2018  . Ankylosing spondylitis (HCC) 11/11/2018  . Uterine fibroid 12/11/2016    Past Medical History:  Diagnosis Date  . Anemia   . Ankylosing spondylitis (HCC)   . Ankylosing spondylitis (HCC)   . Rheumatoid arthritis (HCC)     Family History  Problem Relation Age of Onset  . Arthritis/Rheumatoid Mother    Past Surgical History:  Procedure Laterality Date  . CESAREAN SECTION    . CESAREAN SECTION    . CESAREAN SECTION N/A 05/03/2019   Procedure: CESAREAN SECTION;  Surgeon: Adam Phenix, MD;  Location: [facility] LD ORS;  Service: Obstetrics;  Laterality: N/A;   Social History   Social History Narrative  . Not on file   Immunization History  Administered Date(s) Administered  . Influenza,inj,Quad PF,6+ Mos 02/22/2019  . Janssen (J&J) SARS-COV-2  Vaccination 09/18/2019  . PFIZER(Purple Top)SARS-COV-2 Vaccination 05/08/2020  . Tdap 12/18/2017, 02/22/2019     Objective: Vital Signs: There were no vitals taken for this visit.   Physical Exam   Musculoskeletal Exam: ***  CDAI Exam: CDAI Score: -- Patient Global: --; Provider Global: -- Swollen: --; Tender: -- Joint Exam 04/23/2023   No joint exam has been documented for this visit   There is currently no information documented on the homunculus. Go to the Rheumatology activity and complete the homunculus joint exam.  Investigation: No additional findings.  Imaging: No results found.  Recent Labs: Lab Results  Component Value Date   WBC 4.6 09/16/2022   HGB 12.2 09/16/2022   PLT 316 09/16/2022   NA 138 09/16/2022   K 4.7 09/16/2022   CL 105 09/16/2022   CO2 26 09/16/2022   GLUCOSE 109 (H) 09/16/2022   BUN 14 09/16/2022   CREATININE 0.61 09/16/2022   BILITOT 0.2 09/16/2022   ALKPHOS 29 (L) 12/05/2021   AST 10 09/16/2022   ALT 11 09/16/2022   PROT 7.4 09/16/2022   ALBUMIN 3.7 12/05/2021   CALCIUM 9.1 09/16/2022   GFRAA 144 10/25/2020   QFTBGOLDPLUS NEGATIVE 09/16/2022    Speciality Comments: No specialty comments available.  Procedures:  No procedures performed Allergies: Patient has no known allergies.   Assessment / Plan:     Visit Diagnoses: No diagnosis found.  Orders: No orders of the defined types were placed in this encounter.  No orders of the defined types were placed in this encounter.   Face-to-face time spent with patient was *** minutes. Greater than 50%  of time was spent in counseling and coordination of care.  Follow-Up Instructions: No follow-ups on file.   Ellen Henri, CMA  Note - This record has been created using Animal nutritionist.  Chart creation errors have been sought, but may not always  have been located. Such creation errors do not reflect on  the standard of medical care.

## 2023-04-23 ENCOUNTER — Encounter: Payer: Self-pay | Admitting: Rheumatology

## 2023-04-23 ENCOUNTER — Ambulatory Visit: Payer: Managed Care, Other (non HMO) | Attending: Rheumatology | Admitting: Rheumatology

## 2023-04-23 ENCOUNTER — Telehealth: Payer: Self-pay | Admitting: Pharmacist

## 2023-04-23 VITALS — BP 103/70 | HR 91 | Resp 15 | Ht 65.0 in | Wt 172.2 lb

## 2023-04-23 DIAGNOSIS — M542 Cervicalgia: Secondary | ICD-10-CM

## 2023-04-23 DIAGNOSIS — G8929 Other chronic pain: Secondary | ICD-10-CM

## 2023-04-23 DIAGNOSIS — Z603 Acculturation difficulty: Secondary | ICD-10-CM

## 2023-04-23 DIAGNOSIS — M546 Pain in thoracic spine: Secondary | ICD-10-CM

## 2023-04-23 DIAGNOSIS — H04123 Dry eye syndrome of bilateral lacrimal glands: Secondary | ICD-10-CM

## 2023-04-23 DIAGNOSIS — Z758 Other problems related to medical facilities and other health care: Secondary | ICD-10-CM

## 2023-04-23 DIAGNOSIS — Z79899 Other long term (current) drug therapy: Secondary | ICD-10-CM | POA: Diagnosis not present

## 2023-04-23 DIAGNOSIS — M25462 Effusion, left knee: Secondary | ICD-10-CM

## 2023-04-23 DIAGNOSIS — D269 Other benign neoplasm of uterus, unspecified: Secondary | ICD-10-CM

## 2023-04-23 DIAGNOSIS — M25562 Pain in left knee: Secondary | ICD-10-CM

## 2023-04-23 DIAGNOSIS — M457 Ankylosing spondylitis of lumbosacral region: Secondary | ICD-10-CM

## 2023-04-23 DIAGNOSIS — M25561 Pain in right knee: Secondary | ICD-10-CM

## 2023-04-23 DIAGNOSIS — M461 Sacroiliitis, not elsewhere classified: Secondary | ICD-10-CM | POA: Diagnosis not present

## 2023-04-23 DIAGNOSIS — H209 Unspecified iridocyclitis: Secondary | ICD-10-CM

## 2023-04-23 NOTE — Patient Instructions (Addendum)
Certolizumab Pegol Injection ?? ??? ??????? ????? ???????????? ????? ??????? ???????? ??? ???????? ??????? ???????? ???? ????.  ??? ???? ?? ???? ????? ?????? ??????? ???? ??????.  ??? ????? ??? ?????? ?? ??????? ???? ?????? ???? ???? ??? ????? (TNF).  ??? ??? ???? ????? ???????. ???? ??????? ??? ?????? ?????? ????? ???? ???? ??????? ?????? ?? ??????? ??? ???? ???? ?????. ????? ???????? ???????? ????????:? Cimzia ?? ?? ??????? ???? ??? ?? ???? ??? ???? ??????? ?????? ??? ????? ??? ???????  ?? ????? ??? ????? ?? ??? ??? ?????? ??? ?? ??? ??????? ????:  ???????  ??? ??????  ??????? ????? ?????  ??? ?????  ????? ?? ?????? ????? (?) ?? ???? ?????? ????? (?)  ????? ?????? ???????  ???? ?? ????? ?? ??????  ??? ??? ?????? ????? ??? ??? ??? ???? ???? ??????? ?? ??????? ??????? ?? ??? ???? ???? ???????  ?????? ???????  ???? ???? ?????? ?? ??????? ????? ????  ??? ????? ?? ?????? ??? ??????? ???? ????? ?? ??????? ?????? ?????? ???? ???? ?????  ?? ??? ??? ???? ?? ????? ?????? ???? ????????????? ?? ????? ????? ?? ???????? ?? ??????  ?? ??? ??? ???? ?? ?????? ???? ???????? ?? ???????? ?? ?????? ???????  ????? ?? ?????? ????? ??????? ???????? ??? ??? ???? ??????? ??? ??????? ????? ??? ?????? ??? ?????.  ??? ?????? ????? ?????? ???? ?????? ?? ?????? ?? ?????.  ???? ????? ?????? ?? ??????. ??? ????? ??? ?????? ?? ?????? ???? ?????? ?????? ?????? ???????.  ?????? ??? ?? ????? ??????.  ?????? ?????? ?????? ??? ???? ?????? ?????? ?? ??? ????? ?? ???.  ????? ?? ????? ????? ????? ?????? ?? ?? ???? ??? ????? ?? ?????? ??????? ?????? ?????? ?? ???????. ?? ????? ?? ??? ????? ?????? ????????? ?? ???? ???? ?????? ?? ??????? ??????.  ?? ????? ?? ????? ?????.  ??? ?? ??? ???? ???? ??????? ??????? ???? ???? ??????? ?????? ??? ?????. ????? ??????? ??????? ???? ??? ??? ??? ??? ?? ???? ???? ????? ??????.  ???? ?? ????? ??? ????????? ?????? ?? ?? ???. ???? ??? ???? ??????? ???? ??????? ??? ?????? ???????.  ?? ???? ????  ???? ??? ????? ????. ?????? ??????? : ??? ?????? ??? ?????? ???? ????? ???? ?? ??? ??????? ???? ????? ?????? ?? ?????? ?? ???? ??????? ?????.<br />??????: ?????? ??? ?????? ?? ???? ??? ???. ?? ????? ????? ??? ?? ????? ??? ??????. ???? ?? ???? (????) ????? ??? ??? ????? ??? ?????? ?? ?????? ?? ?????:  ?? ????? ??? ???? ????.  ???? ????? ??????? ??? ??? ??? ???? ??? ???????? ??? ??? ????????. ??? ????? ???? ??? ?????? ?? ??????:  ??? ????? ????? ??????? ?? ???? ??? ????.  ??? ??? ??? ??? ????? ??????? ??????? ? ??????? ??? ?????? ???.  ?? ?????? ?????? ?? ???? ?????.  ???? ???? ??????? ??? ???? ???? ?? ?????. ?? ?? ??????? ???? ?? ?????? ?? ??? ???????  ?? ????? ??? ?????? ?? ?? ??? ???:  ??????? ??????????? ??? ?????????? ??????????? ????????? ???????????? ?????????? ???????????? ???????????? ??????????? ????????????? ???????????? ???????????  ?????? ??? ??????????? ??? ??????? ?? ?? ??? ?? ????????? ????????. ?? ?????? ???? ??????? ?????? ????? ??? ??????? ?? ??????? ?? ??????? ???? ???? ?? ???????? ???????? ???? ????????. ?????? ????? ??? ??? ???? ?? ???? ?????? ?? ?????? ?????? ??? ???????. ?? ?????? ??? ??????? ?? ?????. ?? ???? ??? ???? ????? ??? ??????? ??? ??????? ?? ?????? ???? ??????? ?????? ???? ?????? ??? ?????.  ???? ???? ??????? ??? ?? ????? ??????? ?? ??? ?????? ?????.  ??? ??? ??????? ?? ??? ????? ???? ????? ??????. ???? ??????? ????? ?? ??? ???? ??? ????? ?? ??????? ??? ??????.  ??? ??? ?? ???? ??????? ?? ???? ????? ????? ????? ???? ????? ?? ????? ???? ???? ??? ????? ?? ??? ??????.  ???? ?? ?????? ????? ???? ???? ???????. ??? ?????? ?? ???? ?? ??? ??????? ?????.  ????? ???? ??????? ??? ???? ?????? ?? ???????? ?? ?????? ?????? ?? ????? ???? ?? ????? ????? ?? ??????????.  ?? ????? ???? ?????.  ???? ???? ???????? ???????? ??????. ???? ??? ???? ??????? ??? ??? ?????? ????????.  ?? ???? ?????? ????? ????? ???? ??????? ?????? ????? ?? ??????? ??? ?????? ??? ??????. ?? ?? ?????? ???????? ????  ???? ?? ??????? ??? ???? ??? ???????  ?????? ???????? ???? ??? ?? ???? ???? ??????? ??????? ?? ??? ?? ???? ??? ????:  ??????? ????????--????? ??????? ?????? ??????????? (?????)? ???? ????? ?? ?????? ?? ?????? ?? ?????  ??? ???? ?????????--????? ?? ????? ??? ???????? ?? ??????? ?? ??????? ?? ????? ??????? ?? ?????? ?? ??????? ?????????  ?????? ?? ?????? ?? ??????? ?? ?????  ??? ?????--??? ??????? ???? ???????? ?? ??????? ?? ??????? ????? ????? ????????? ????? ?? ????? ??? ???????.  ??????--?????? ?????????? ??????? ?????? ?????? ?????? ???? ?? ?????? ????? ?? ??? ?????? ?? ????? ??????? ?????? ????? ???? ?????? ?? ??????  ??????? ????? ??????--????? ?? ?????? ?? ?????? ?? ???????? ????? ?????? ???? ???? ??? ??? ????? ??? ?????? ????? ?????? ???? ????? ????? ?? ?????? ?????? ????? ?? ????? ??? ???????  ??????? ???????? (????????)  ???? ?? ????? ??? ?????? ?????? ???????? ???? ?? ????? ????? ???? (???? ????? ?? ???? ??????? ??? ??? ?????? ?? ???? ????? ?????):  ??? ?????  ????  ?????? ??????  ??????  ??????  ??? ?? ?????? ?? ?????? ?? ???? ?????? ?????? ????? ??? ??????? ?? ?? ??? ?? ?????? ???????? ????????. ???? ?????? ????????? ?????? ?? ?????? ????????. ????? ??????? ?? ?????? ???????? ?????? ??????? ???????? ????????? (  FDA) ??? ????? ?.[phone]??? ??? ??? ???? ???????? ??????? ???? ?????? ?? ?????? ??????? ?? ????????? ???????. ?? ??????? ?? ???????.  ?? ??????.  ????? ???? ?????? ?? ?????? ??????? ??? ???? ?????? ???????.  ???? ????? ?? ?????.  ???? ?? ?? ???? ??? ?????? ??? ????? ?????? ????????.  ?????? ?? ??????? ???? ?? ??? ???? ???? ????? ?? ?????? ????????:  ?? ?????? ??? ?????? ????? ??????? ??? ??????.  ???? ?? ???????? ?? ???? ????? ??????? ?????? ??? ????. ??? ?? ????? ?? ????? ??????? ????? ??????? ?? ???? ??????? ?? ????? ?????? ?? ??? ?????? ?????. <b>??????: ??? ??????? ????? ?? ????: ?? ?? ???? ??? ???????? ?? ????????? ???????. ??? ???? ???? ?? ????? ?? ??? ???????  ????? ??? ?????? ?? ??????? ?? ???? ??????? ??????.</b>   2024 Elsevier/Gold Standard (2021-11-07 00:00:00)  Standing Labs We placed an order today for your standing lab work.   Please have your standing labs drawn in 1 month and then every 3 months after starting Cimzia.  Please have your labs drawn 2 weeks prior to your appointment so that the provider can discuss your lab results at your appointment, if possible.  Please note that you may see your imaging and lab results in MyChart before we have reviewed them. We will contact you once all results are reviewed. Please allow our office up to 72 hours to thoroughly review all of the results before contacting the office for clarification of your results.  WALK-IN LAB HOURS  Monday through Thursday from 8:00 am -12:30 pm and 1:00 pm-5:00 pm and Friday from 8:00 am-12:00 pm.  Patients with office visits requiring labs will be seen before walk-in labs.  You may encounter longer than normal wait times. Please allow additional time. Wait times may be shorter on  Monday and Thursday afternoons.  We do not book appointments for walk-in labs. We appreciate your patience and understanding with our staff.   Labs are drawn by [facility]. Please bring your co-pay at the time of your lab draw.  You may receive a bill from [facility] for your lab work.  Please note if you are on Hydroxychloroquine and and an order has been placed for a Hydroxychloroquine level,  you will need to have it drawn 4 hours or more after your last dose.  If you wish to have your labs drawn at another location, please call the office 24 hours in advance so we can fax the orders.  The office is located at [address], [address], [place], [place] [address]   If you have any questions regarding directions or hours of operation,  please call [phone].   As a reminder, please drink plenty of water prior to coming for your lab work. Thanks!   Vaccines You are taking a  medication(s) that can suppress your immune system.  The following immunizations are recommended: Flu annually Covid-19  RSV Td/Tdap (tetanus, diphtheria, pertussis) every 10 years Pneumonia (Prevnar 15 then Pneumovax 23 at least 1 year apart.  Alternatively, can take Prevnar 20 without needing additional dose) Shingrix: 2 doses from 4 weeks to 6 months apart  Please check with your PCP to make sure you are up to date.  If you have signs or symptoms of an infection or start antibiotics: First, call your PCP for workup of your infection. Hold your medication through the infection, until you complete your antibiotics, and until symptoms resolve if you take the following: Injectable medication (Actemra, Benlysta, Cimzia, Cosentyx, Enbrel, Humira, Kevzara, Orencia, Remicade,  Simponi, Stelara, Lesle Chris) Methotrexate Leflunomide (Arava) Mycophenolate (Cellcept) Osborne Oman, or Rinvoq

## 2023-04-23 NOTE — Telephone Encounter (Signed)
Pending OV note and updated labs from today, patient is restarting Cimzia.  Dose: 400mg  at Week 0, 2, 4 then every 4 weeks thereafter  Chesley Mires, PharmD, MPH, BCPS, CPP Clinical Pharmacist (Rheumatology and Pulmonology)

## 2023-04-23 NOTE — Progress Notes (Signed)
Pharmacy Note  Subjective: Patient presents today to [facility] for follow up office visit.   Patient seen by the pharmacist for counseling on Cimzia for ankylosing spondylitis and uveitis . Was previously doing quite well on Cimzia when compliant, but she came off of medication due to insurance changes. She has been off of treatment now for 6 months unfortunately and has active iritis flare today.  She is no longer actively family planning (has an IUD)  Diagnosis of heart failure: No  Objective:  CBC    Component Value Date/Time   WBC 4.6 09/16/2022 0951   RBC 4.21 09/16/2022 0951   HGB 12.2 09/16/2022 0951   HGB 12.8 08/26/2019 1409   HCT 36.8 09/16/2022 0951   HCT 40.0 08/26/2019 1409   PLT 316 09/16/2022 0951   PLT 338 08/26/2019 1409   MCV 87.4 09/16/2022 0951   MCV 88 08/26/2019 1409   MCH 29.0 09/16/2022 0951   MCHC 33.2 09/16/2022 0951   RDW 12.8 09/16/2022 0951   RDW 14.1 08/26/2019 1409   LYMPHSABS 2,075 09/16/2022 0951   LYMPHSABS 1.8 08/26/2019 1409   MONOABS 0.3 12/05/2021 0110   EOSABS 28 09/16/2022 0951   EOSABS 0.1 08/26/2019 1409   BASOSABS 18 09/16/2022 0951   BASOSABS 0.0 08/26/2019 1409    CMP     Component Value Date/Time   NA 138 09/16/2022 0951   NA 139 02/22/2019 0920   K 4.7 09/16/2022 0951   CL 105 09/16/2022 0951   CO2 26 09/16/2022 0951   GLUCOSE 109 (H) 09/16/2022 0951   BUN 14 09/16/2022 0951   BUN 6 02/22/2019 0920   CREATININE 0.61 09/16/2022 0951   CALCIUM 9.1 09/16/2022 0951   PROT 7.4 09/16/2022 0951   PROT 6.2 02/22/2019 0920   ALBUMIN 3.7 12/05/2021 0110   ALBUMIN 3.5 (L) 02/22/2019 0920   AST 10 09/16/2022 0951   ALT 11 09/16/2022 0951   ALKPHOS 29 (L) 12/05/2021 0110   BILITOT 0.2 09/16/2022 0951   BILITOT <0.2 02/22/2019 0920   GFRNONAA >60 12/05/2021 0110   GFRNONAA 125 10/25/2020 1102   GFRAA 144 10/25/2020 1102     Baseline Immunosuppressant Therapy Labs TB GOLD    Latest Ref Rng & Units  09/16/2022    9:51 AM  Quantiferon TB Gold  Quantiferon TB Gold Plus NEGATIVE NEGATIVE    Hepatitis Panel    Latest Ref Rng & Units 12/04/2018   11:17 AM  Hepatitis  Hep B Surface Ag Negative Negative   12/19/2017  Hepatitis B core antibody - nonreactive Hepatitis B surface antigen - non reactive Hepatitis C antibody - non reactive   HIV Lab Results  Component Value Date   HIV Non Reactive 02/22/2019   HIV Non Reactive 12/04/2018   HIV Non Reactive 12/18/2017   Immunoglobulins    Latest Ref Rng & Units 12/19/2017    8:59 AM  Immunoglobulin Electrophoresis  IgA  81 - 463 mg/dL 295   IgG 621 - 3,086 mg/dL 5,784   IgM 48 - 696 mg/dL 295    SPEP    Latest Ref Rng & Units 09/16/2022    9:51 AM  Serum Protein Electrophoresis  Total Protein 6.1 - 8.1 g/dL 7.4    M8UX No results found for: "G6PDH" TPMT No results found for: "TPMT"   Chest x-ray: 12/19/17 - No active cardiopulmonary disease.  Does patient have diagnosis of heart failure?  No  Assessment/Plan:  Counseled patient that Cimzia is a TNF  blocking agent.  Counseled patient on purpose, proper use, and adverse effects of Cimzia.  Reviewed the most common adverse effects including infections, headache, and injection site reactions. Discussed that there is the possibility of an increased risk of malignancy including non-melanoma skin cancer but it is not well understood if this increased risk is due to the medication or the disease state.  Advised patient to get yearly dermatology exams due to risk of skin cancer.  Counseled patient that Cimzia should be held prior to scheduled surgery.  Counseled patient to avoid live vaccines while on Cimzia.  Recommend annual influenza, PCV 15 or PCV20 or Pneumovax 23, and Shingrix as indicated.   Reviewed the importance of regular labs while on Cimzia therapy. Will monitor CBC and CMP 1 month after starting and then every 3 months routinely thereafter. Will monitor TB gold annually.  Standing orders placed. Provided patient with medication education material and answered all questions.  Patient voiced understanding.  Patient consented to Cimzia.  Will upload consent into the media tab.  Reviewed storage instructions for Cimzia.  Will apply for Cimzia through patient's insurance.  Advised initial injection must be administered in office.    Dose will be Cimzia 400 mg at weeks 0,2,4 then Cimzia 400 mg every 28 days  Prescription pending updated baseline labs and insurance approval.   Briefly reviewed that if Cimzia is denied, we may need to try for Humira which she is okay with trying if Cimzia denied.  Chesley Mires, PharmD, MPH, BCPS, CPP Clinical Pharmacist (Rheumatology and Pulmonology)

## 2023-04-24 LAB — CBC WITH DIFFERENTIAL/PLATELET
Absolute Lymphocytes: 1206 {cells}/uL (ref 850–3900)
Absolute Monocytes: 319 {cells}/uL (ref 200–950)
Basophils Absolute: 9 {cells}/uL (ref 0–200)
Basophils Relative: 0.3 %
Eosinophils Absolute: 31 {cells}/uL (ref 15–500)
Eosinophils Relative: 1 %
HCT: 38.1 % (ref 35.0–45.0)
Hemoglobin: 12.1 g/dL (ref 11.7–15.5)
MCH: 28.1 pg (ref 27.0–33.0)
MCHC: 31.8 g/dL — ABNORMAL LOW (ref 32.0–36.0)
MCV: 88.4 fL (ref 80.0–100.0)
MPV: 9.8 fL (ref 7.5–12.5)
Monocytes Relative: 10.3 %
Neutro Abs: 1535 {cells}/uL (ref 1500–7800)
Neutrophils Relative %: 49.5 %
Platelets: 289 10*3/uL (ref 140–400)
RBC: 4.31 10*6/uL (ref 3.80–5.10)
RDW: 12.7 % (ref 11.0–15.0)
Total Lymphocyte: 38.9 %
WBC: 3.1 10*3/uL — ABNORMAL LOW (ref 3.8–10.8)

## 2023-04-24 LAB — COMPLETE METABOLIC PANEL WITH GFR
AG Ratio: 1.4 (calc) (ref 1.0–2.5)
ALT: 10 U/L (ref 6–29)
AST: 11 U/L (ref 10–30)
Albumin: 4.4 g/dL (ref 3.6–5.1)
Alkaline phosphatase (APISO): 35 U/L (ref 31–125)
BUN/Creatinine Ratio: 27 (calc) — ABNORMAL HIGH (ref 6–22)
BUN: 13 mg/dL (ref 7–25)
CO2: 27 mmol/L (ref 20–32)
Calcium: 9.1 mg/dL (ref 8.6–10.2)
Chloride: 103 mmol/L (ref 98–110)
Creat: 0.48 mg/dL — ABNORMAL LOW (ref 0.50–0.97)
Globulin: 3.1 g/dL (ref 1.9–3.7)
Glucose, Bld: 86 mg/dL (ref 65–99)
Potassium: 4.2 mmol/L (ref 3.5–5.3)
Sodium: 137 mmol/L (ref 135–146)
Total Bilirubin: 0.5 mg/dL (ref 0.2–1.2)
Total Protein: 7.5 g/dL (ref 6.1–8.1)
eGFR: 125 mL/min/{1.73_m2} (ref >=60)

## 2023-04-24 LAB — SEDIMENTATION RATE: Sed Rate: 9 mm/h (ref 0–20)

## 2023-04-24 NOTE — Telephone Encounter (Signed)
Submitted a Prior Authorization request to [employer] for CIMZIA via CoverMyMeds. Will update once we receive a response.  Key: [id number]   Chesley Mires, PharmD, MPH, BCPS, CPP Clinical Pharmacist (Rheumatology and Pulmonology)

## 2023-04-24 NOTE — Progress Notes (Signed)
White cell count is low at 3.1.  CMP is normal.  Sed rate is normal.  We will recheck labs a month after starting Cimzia and then every 3 months.

## 2023-04-28 ENCOUNTER — Telehealth: Payer: Self-pay | Admitting: Pharmacist

## 2023-04-28 DIAGNOSIS — M457 Ankylosing spondylitis of lumbosacral region: Secondary | ICD-10-CM

## 2023-04-28 DIAGNOSIS — H209 Unspecified iridocyclitis: Secondary | ICD-10-CM

## 2023-04-28 DIAGNOSIS — Z79899 Other long term (current) drug therapy: Secondary | ICD-10-CM

## 2023-04-28 NOTE — Telephone Encounter (Signed)
P[lease start Humira BIV:  Dx: AS (secondary dx) + uveitis (primary dx)  Auth will need to be for uveitis starter pack: 80 mg at Week 0, followed by 40 mg at Week 1, then 40mg  every other week  Chesley Mires, PharmD, MPH, BCPS, CPP Clinical Pharmacist (Rheumatology and Pulmonology)

## 2023-04-28 NOTE — Telephone Encounter (Signed)
Received a fax regarding Prior Authorization from [employer] for [facility]. Authorization has been DENIED because patient must have had failure, contraindication, or intolerance to 2 of the covered alternatives: adalimumab, Cosentyx, Enbrel, Rinvoq, Xeljanz..  I had already discussed with patient at last OV that since she is no longer family planning, Cimzia may not be preferred option. She was aware of Humira as next TNF inhibitor as preferred option for AS + uveitis. Will need to start BIV  Chesley Mires, PharmD, MPH, BCPS, CPP Clinical Pharmacist (Rheumatology and Pulmonology)

## 2023-04-30 NOTE — Telephone Encounter (Signed)
Submitted a Prior Authorization request to [employer] for HUMIRA UVEITIS STARTER KIT via CoverMyMeds. Will update once we receive a response.  Key: [id number]

## 2023-05-02 ENCOUNTER — Other Ambulatory Visit: Payer: Self-pay

## 2023-05-02 ENCOUNTER — Other Ambulatory Visit (HOSPITAL_COMMUNITY): Payer: Self-pay

## 2023-05-02 MED ORDER — HUMIRA-PSORIASIS/UVEIT STARTER 80 MG/0.8ML & 40MG/0.4ML ~~LOC~~ AJKT
40.0000 mg | AUTO-INJECTOR | SUBCUTANEOUS | 0 refills | Status: DC
Start: 2023-05-02 — End: 2024-04-12
  Filled 2023-05-02: qty 3, 35d supply, fill #0

## 2023-05-02 NOTE — Telephone Encounter (Addendum)
Received notification from [employer] regarding a prior authorization for HUMIRA. Authorization has been APPROVED from 04/30/2023 to 10/27/2023.   Per test claim, copay for 28 days supply is $1,339.46 (after evoucher is applied)  Patient can fill through [facility]: [phone] .  Authorization # [id number]  Rx sent to [facility] today.  Called patient used interpreter Cleotis Lema [phone]. Patient scheduled for Humira new start on 05/12/2023.  Chesley Mires, PharmD, MPH, BCPS, CPP Clinical Pharmacist (Rheumatology and Pulmonology)

## 2023-05-02 NOTE — Progress Notes (Addendum)
Specialty Pharmacy Initial Fill Coordination Note  Kathleen Nguyen is a 37 y.o. female contacted today regarding refills of specialty medication(s) Adalimumab   Patient requested Courier to Provider Office   Delivery date: 05/06/23   Verified address: [address] [place], [place] [address]  Medication will be filled on 05/05/23.   Patient is aware of $0 copayment. Welcome Packet will need to be mailed to pt as she has not set up her MyChart at this time.   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 Payment summary pop-up window provides instructions on how to remove the e-voucher, however if this does not work then [name] will need to be contacted directly while claim is adjudicated for further assistance.

## 2023-05-05 ENCOUNTER — Other Ambulatory Visit: Payer: Self-pay

## 2023-05-06 ENCOUNTER — Other Ambulatory Visit (HOSPITAL_COMMUNITY): Payer: Self-pay

## 2023-05-07 ENCOUNTER — Other Ambulatory Visit: Payer: Self-pay

## 2023-05-07 ENCOUNTER — Other Ambulatory Visit (HOSPITAL_COMMUNITY): Payer: Self-pay

## 2023-05-08 ENCOUNTER — Other Ambulatory Visit: Payer: Self-pay

## 2023-05-12 ENCOUNTER — Other Ambulatory Visit: Payer: Self-pay

## 2023-05-12 ENCOUNTER — Ambulatory Visit: Payer: Managed Care, Other (non HMO) | Attending: Rheumatology | Admitting: Pharmacist

## 2023-05-12 ENCOUNTER — Other Ambulatory Visit (HOSPITAL_COMMUNITY): Payer: Self-pay

## 2023-05-12 DIAGNOSIS — Z79899 Other long term (current) drug therapy: Secondary | ICD-10-CM

## 2023-05-12 DIAGNOSIS — M457 Ankylosing spondylitis of lumbosacral region: Secondary | ICD-10-CM

## 2023-05-12 DIAGNOSIS — Z7189 Other specified counseling: Secondary | ICD-10-CM

## 2023-05-12 DIAGNOSIS — H209 Unspecified iridocyclitis: Secondary | ICD-10-CM

## 2023-05-12 MED ORDER — HUMIRA (2 PEN) 40 MG/0.4ML ~~LOC~~ AJKT
40.0000 mg | AUTO-INJECTOR | SUBCUTANEOUS | 0 refills | Status: DC
  Filled 2023-05-12: qty 4, 56d supply, fill #0
  Filled 2023-05-29: qty 2, 28d supply, fill #0

## 2023-05-12 NOTE — Progress Notes (Signed)
Patient started Humira in clinic today 05/12/23. Please see clinical support note  Loading dose for uveitis due to active uveitis flare: 80mg  at Week 0, 40mg  at Week 1, then 40mg  every 14 days thereafter  Chesley Mires, PharmD, MPH, BCPS, CPP Clinical Pharmacist (Rheumatology and Pulmonology)

## 2023-05-12 NOTE — Progress Notes (Signed)
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 She has been off of Cimzia for some time due to loss to f/u. She is no longer family planning and has IUD so Cimzia was denied  Patient running a fever or have signs/symptoms of infection? No  Patient currently on antibiotics for the treatment of infection? No  Patient have any upcoming invasive procedures/surgeries? No  Objective: CMP     Component Value Date/Time   NA 137 04/23/2023 0841   NA 139 02/22/2019 0920   K 4.2 04/23/2023 0841   CL 103 04/23/2023 0841   CO2 27 04/23/2023 0841   GLUCOSE 86 04/23/2023 0841   BUN 13 04/23/2023 0841   BUN 6 02/22/2019 0920   CREATININE 0.48 (L) 04/23/2023 0841   CALCIUM 9.1 04/23/2023 0841   PROT 7.5 04/23/2023 0841   PROT 6.2 02/22/2019 0920   ALBUMIN 3.7 12/05/2021 0110   ALBUMIN 3.5 (L) 02/22/2019 0920   AST 11 04/23/2023 0841   ALT 10 04/23/2023 0841   ALKPHOS 29 (L) 12/05/2021 0110   BILITOT 0.5 04/23/2023 0841   BILITOT <0.2 02/22/2019 0920   GFRNONAA >60 12/05/2021 0110   GFRNONAA 125 10/25/2020 1102   GFRAA 144 10/25/2020 1102    CBC    Component Value Date/Time   WBC 3.1 (L) 04/23/2023 0841   RBC 4.31 04/23/2023 0841   HGB 12.1 04/23/2023 0841   HGB 12.8 08/26/2019 1409   HCT 38.1 04/23/2023 0841   HCT 40.0 08/26/2019 1409   PLT 289 04/23/2023 0841   PLT 338 08/26/2019 1409   MCV 88.4 04/23/2023 0841   MCV 88 08/26/2019 1409   MCH 28.1 04/23/2023 0841   MCHC 31.8 (L) 04/23/2023 0841   RDW 12.7 04/23/2023 0841   RDW 14.1 08/26/2019 1409   LYMPHSABS 2,075 09/16/2022 0951   LYMPHSABS 1.8 08/26/2019 1409   MONOABS 0.3 12/05/2021 0110   EOSABS 31 04/23/2023 0841   EOSABS 0.1 08/26/2019 1409   BASOSABS 9 04/23/2023 0841   BASOSABS 0.0 08/26/2019 1409    Baseline Immunosuppressant Therapy Labs TB GOLD    Latest Ref Rng & Units 09/16/2022    9:51 AM  Quantiferon TB Gold   Quantiferon TB Gold Plus NEGATIVE NEGATIVE    Hepatitis Panel    Latest Ref Rng & Units 12/04/2018   11:17 AM  Hepatitis  Hep B Surface Ag Negative Negative    HIV Lab Results  Component Value Date   HIV Non Reactive 02/22/2019   HIV Non Reactive 12/04/2018   HIV Non Reactive 12/18/2017   Immunoglobulins    Latest Ref Rng & Units 12/19/2017    8:59 AM  Immunoglobulin Electrophoresis  IgA  81 - 463 mg/dL 846   IgG 962 - 9,528 mg/dL 4,132   IgM 48 - 440 mg/dL 102    SPEP    Latest Ref Rng & Units 04/23/2023    8:41 AM  Serum Protein Electrophoresis  Total Protein 6.1 - 8.1 g/dL 7.5    V2ZD No results found for: "G6PDH" TPMT No results found for: "TPMT"   Chest x-ray: 12/19/2017 - No active cardiopulmonary disease.  Assessment/Plan:  Counseled patient that Humira is a TNF blocking agent.  Counseled patient on purpose, proper use, and adverse effects of Humira.  Reviewed the most common adverse effects including infections, headache, and injection site reactions. Discussed that there is the possibility of an increased risk of  malignancy including non-melanoma skin cancer but it is not well understood if this increased risk is due to the medication or the disease state.  Advised patient to get yearly dermatology exams due to risk of skin cancer. Counseled patient that Humira should be held prior to scheduled surgery.  Counseled patient to avoid live vaccines while on Humira.  Recommend annual influenza, PCV 15 or PCV20 or Pneumovax 23, and Shingrix as indicated.  Reviewed the importance of regular labs while on Humira therapy.  Will monitor CBC and CMP 1 month after starting and then every 3 months routinely thereafter. Will monitor TB gold annually. Standing orders placed.    Provided patient with medication education material and answered all questions.  Patient consented to Humira.  Will upload consent into the media tab.  Reviewed storage instructions of Humira. Patient  verbalized understanding.   Reviewed importance of holding Humira with signs/symptoms of an infections, if antibiotics are prescribed to treat an active infection, and with invasive procedures  Demonstrated proper injection technique with Humira demo device  Patient able to demonstrate proper injection technique using the teach back method.  Patient self injected in the left upper thigh with:  Pharmacy-Supplied Medication: Humira 80mg /0.45mL pen NDC: [address]-40 Lot: [id number] Expiration: 02/2024  Patient tolerated well.  Observed for 30 mins in office for adverse reaction. Patient denies itchiness and irritation at injection., No swelling or redness noted., and Reviewed injection site reaction management with patient verbally and printed information for review in AVS  Patient is to return in 1 month for labs and 6-8 weeks for follow-up appointment.  Standing orders for CBC/CMP placed.  TB gold will be monitored yearly.   Humira approved through insurance .   Rx sent to: [facility]: [phone] .  Patient provided with pharmacy phone number and advised to call later this week to schedule shipment to home.  Patient will continue Humira 80mg  at Week 0 (administered in clinic today), 40mg  at Week 1, then 40mg  subcut every 14 days  All questions encouraged and answered.  Instructed patient to call with any further questions or concerns.  Chesley Mires, PharmD, MPH, BCPS, CPP Clinical Pharmacist (Rheumatology and Pulmonology)  05/12/2023 8:05 AM

## 2023-05-12 NOTE — Patient Instructions (Addendum)
Your next HUMIRA dose is due on 11/25, 12/9, and every 2 weeks thereafter  HOLD HUMIRA if you have signs or symptoms of an infection. You can resume once you feel better or back to your baseline. HOLD HUMIRA if you start antibiotics to treat an infection. HOLD HUMIRA around the time of surgery/procedures. Your surgeon will be able to provide recommendations on when to hold BEFORE and when you are cleared to RESUME.  Pharmacy information: Your prescription will be shipped from [facility]. Their phone number is [phone] They will call to schedule shipment and confirm address. They will mail your medication to your home.  Labs are due in 1 month then every 3 months. Lab hours are from Monday to Thursday 8am-12:30pm and 1pm-5pm and Friday 8am-12pm. You do not need an appointment if you come for labs during these times. If you'd like to go to a [facility] or [facility] closer to home, please call our clinic 48 hours prior to lab date so we can release orders in a timely manner.  Stay up to date on all routine vaccines: influenza, pneumonia, COVID19, Shingles  How to manage an injection site reaction: Remember the 5 C's: COUNTER - leave on the counter at least 30 minutes but up to overnight to bring medication to room temperature. This may help prevent stinging COLD - place something cold (like an ice gel pack or cold water bottle) on the injection site just before cleansing with alcohol. This may help reduce pain CLARITIN - use Claritin (generic name is loratadine) for the first two weeks of treatment or the day of, the day before, and the day after injecting. This will help to minimize injection site reactions CORTISONE CREAM - apply if injection site is irritated and itching CALL ME - if injection site reaction is bigger than the size of your fist, looks infected, blisters, or if you develop hives

## 2023-05-21 ENCOUNTER — Other Ambulatory Visit: Payer: Self-pay

## 2023-05-29 ENCOUNTER — Other Ambulatory Visit: Payer: Self-pay

## 2023-05-29 NOTE — Progress Notes (Signed)
Specialty Pharmacy Refill Coordination Note  Kathleen Nguyen is a 37 y.o. female contacted today regarding refills of specialty medication(s) Adalimumab   Patient requested Delivery   Delivery date: 06/03/23   Verified address: [address] [name], [address]   Medication will be filled on 06/02/23.

## 2023-06-02 ENCOUNTER — Other Ambulatory Visit (HOSPITAL_COMMUNITY): Payer: Self-pay

## 2023-06-02 ENCOUNTER — Other Ambulatory Visit: Payer: Self-pay

## 2023-06-02 MED ORDER — HUMIRA (2 PEN) 40 MG/0.4ML ~~LOC~~ AJKT
40.0000 mg | AUTO-INJECTOR | SUBCUTANEOUS | 0 refills | Status: DC
  Filled 2023-06-02 – 2023-06-24 (×3): qty 2, 28d supply, fill #0
  Filled 2023-07-18: qty 2, 28d supply, fill #1

## 2023-06-02 NOTE — Addendum Note (Signed)
Addended by: Murrell Redden on: 06/02/2023 08:04 AM   Modules accepted: Orders

## 2023-06-06 ENCOUNTER — Other Ambulatory Visit: Payer: Self-pay

## 2023-06-09 ENCOUNTER — Other Ambulatory Visit (HOSPITAL_COMMUNITY): Payer: Self-pay

## 2023-06-11 ENCOUNTER — Other Ambulatory Visit (HOSPITAL_COMMUNITY): Payer: Self-pay

## 2023-06-11 NOTE — Progress Notes (Signed)
LVM for patient to call Pharmacy. Placing Rx on hold due to ins reject "patient not eligible due to non payment of premium" Per [place] on 12/13 Pt [place] paying premium of primary. & medication still not going thru.

## 2023-06-24 ENCOUNTER — Other Ambulatory Visit: Payer: Self-pay

## 2023-06-24 NOTE — Progress Notes (Signed)
 Specialty Pharmacy Refill Coordination Note  Ralph Benavidez is a 37 y.o. female contacted today regarding refills of specialty medication(s) Adalimumab  (Humira  (2 Pen))   Patient requested Delivery   Delivery date: 06/27/23   Verified address: [address]   [place] [address]   Medication will be filled on 06/26/23.

## 2023-06-26 ENCOUNTER — Other Ambulatory Visit (HOSPITAL_COMMUNITY): Payer: Self-pay

## 2023-06-26 ENCOUNTER — Other Ambulatory Visit: Payer: Self-pay

## 2023-06-26 ENCOUNTER — Telehealth: Payer: Self-pay

## 2023-06-26 NOTE — Telephone Encounter (Signed)
 Received notification from [facility] pharmacy that patient has obtained new insurance and requires a new authorization for their medication.  Submitted an URGENT Prior Authorization request to [facility] for HUMIRA  via CoverMyMeds. Will update once we receive a response.  Key: [id number]

## 2023-06-26 NOTE — Telephone Encounter (Signed)
 Received notification from [facility] regarding a prior authorization for HUMIRA . Authorization has been APPROVED from 06/26/2023 to 06/25/2024. Approval letter sent to scan center.  Patient can continue to fill through [facility]: [phone]   Phone # [phone]  Sherry Pennant, PharmD, MPH, BCPS, CPP Clinical Pharmacist (Rheumatology and Pulmonology)

## 2023-07-11 NOTE — Progress Notes (Deleted)
 Office Visit Note  Patient: Kathleen Nguyen             Date of Birth: 09/27/1985           MRN: [id number]             PCP: Grayce Sessions, NP Referring: Grayce Sessions, NP Visit Date: 07/25/2023 Occupation: @GUAROCC @  Subjective:    History of Present Illness: Chasiti Waddington is a 38 y.o. female with history of ankylosing spondylitis. Patient remains on Cimzia 400 mg sq injections every 4 weeks.   CBC and CMP updated on 04/23/23.  Orders for CBC and CMP released today.   TB gold negative on 09/16/22.    Activities of Daily Living:  Patient reports morning stiffness for *** {minute/hour:19697}.   Patient {ACTIONS;DENIES/REPORTS:21021675::"Denies"} nocturnal pain.  Difficulty dressing/grooming: {ACTIONS;DENIES/REPORTS:21021675::"Denies"} Difficulty climbing stairs: {ACTIONS;DENIES/REPORTS:21021675::"Denies"} Difficulty getting out of chair: {ACTIONS;DENIES/REPORTS:21021675::"Denies"} Difficulty using hands for taps, buttons, cutlery, and/or writing: {ACTIONS;DENIES/REPORTS:21021675::"Denies"}  No Rheumatology ROS completed.   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   Anemia in pregnancy 12/07/2018   Language barrier 12/04/2018   History of cesarean delivery 12/04/2018   Ankylosing spondylitis (HCC) 11/11/2018   Uterine fibroid 12/11/2016    Past Medical History:  Diagnosis Date   Anemia    Ankylosing spondylitis (HCC)    Ankylosing spondylitis (HCC)    Rheumatoid arthritis (HCC)     Family History  Problem Relation Age of Onset   Arthritis/Rheumatoid Mother    Past Surgical History:  Procedure Laterality Date   CESAREAN SECTION     CESAREAN SECTION     CESAREAN SECTION N/A 05/03/2019   Procedure: CESAREAN SECTION;  Surgeon: Adam Phenix, MD;  Location: [facility] LD ORS;  Service: Obstetrics;  Laterality: N/A;   Social History   Social History  Narrative   Not on file   Immunization History  Administered Date(s) Administered   Influenza,inj,Quad PF,6+ Mos 02/22/2019   Janssen (J&J) SARS-COV-2 Vaccination 09/18/2019   PFIZER(Purple Top)SARS-COV-2 Vaccination 05/08/2020   Tdap 12/18/2017, 02/22/2019     Objective: Vital Signs: There were no vitals taken for this visit.   Physical Exam Vitals and nursing note reviewed.  Constitutional:      Appearance: She is well-developed.  HENT:     Head: Normocephalic and atraumatic.  Eyes:     Conjunctiva/sclera: Conjunctivae normal.  Cardiovascular:     Rate and Rhythm: Normal rate and regular rhythm.     Heart sounds: Normal heart sounds.  Pulmonary:     Effort: Pulmonary effort is normal.     Breath sounds: Normal breath sounds.  Abdominal:     General: Bowel sounds are normal.     Palpations: Abdomen is soft.  Musculoskeletal:     Cervical back: Normal range of motion.  Lymphadenopathy:     Cervical: No cervical adenopathy.  Skin:    General: Skin is warm and dry.     Capillary Refill: Capillary refill takes less than 2 seconds.  Neurological:     Mental Status: She is alert and oriented to person, place, and time.  Psychiatric:        Behavior: Behavior normal.      Musculoskeletal Exam: ***  CDAI Exam: CDAI Score: -- Patient Global: --; Provider Global: -- Swollen: --; Tender: -- Joint Exam 07/25/2023   No joint exam has been documented for this visit   There  is currently no information documented on the homunculus. Go to the Rheumatology activity and complete the homunculus joint exam.  Investigation: No additional findings.  Imaging: No results found.  Recent Labs: Lab Results  Component Value Date   WBC 3.1 (L) 04/23/2023   HGB 12.1 04/23/2023   PLT 289 04/23/2023   NA 137 04/23/2023   K 4.2 04/23/2023   CL 103 04/23/2023   CO2 27 04/23/2023   GLUCOSE 86 04/23/2023   BUN 13 04/23/2023   CREATININE 0.48 (L) 04/23/2023   BILITOT 0.5  04/23/2023   ALKPHOS 29 (L) 12/05/2021   AST 11 04/23/2023   ALT 10 04/23/2023   PROT 7.5 04/23/2023   ALBUMIN 3.7 12/05/2021   CALCIUM 9.1 04/23/2023   GFRAA 144 10/25/2020   QFTBGOLDPLUS NEGATIVE 09/16/2022    Speciality Comments: Humira started 05/12/23 (uveitis dose)  Procedures:  No procedures performed Allergies: Patient has no known allergies.   Assessment / Plan:     Visit Diagnoses: Ankylosing spondylitis of lumbosacral region [facility])  High risk medication use  Iritis  Sacroiliitis (HCC)  Neck pain  Language barrier  Pain in thoracic spine  Chronic pain of both knees  Dry eyes  Uterine adenomyoma  Orders: No orders of the defined types were placed in this encounter.  No orders of the defined types were placed in this encounter.   Face-to-face time spent with patient was *** minutes. Greater than 50% of time was spent in counseling and coordination of care.  Follow-Up Instructions: No follow-ups on file.   Gearldine Bienenstock, PA-C  Note - This record has been created using Dragon software.  Chart creation errors have been sought, but may not always  have been located. Such creation errors do not reflect on  the standard of medical care.

## 2023-07-16 ENCOUNTER — Other Ambulatory Visit: Payer: Self-pay

## 2023-07-18 ENCOUNTER — Other Ambulatory Visit: Payer: Self-pay

## 2023-07-18 NOTE — Progress Notes (Signed)
Specialty Pharmacy Refill Coordination Note  Kathleen Nguyen is a 38 y.o. female contacted today via interpreter line regarding refills of specialty medication(s) Adalimumab (Humira (2 Pen))   Patient requested Delivery   Delivery date: 07/23/23   Verified address: [address]   [name] [address]   Medication will be filled on 07/22/23.

## 2023-07-22 ENCOUNTER — Other Ambulatory Visit: Payer: Self-pay

## 2023-07-24 ENCOUNTER — Other Ambulatory Visit: Payer: Self-pay

## 2023-07-25 ENCOUNTER — Other Ambulatory Visit: Payer: Self-pay

## 2023-07-25 ENCOUNTER — Other Ambulatory Visit (HOSPITAL_COMMUNITY): Payer: Self-pay

## 2023-07-25 ENCOUNTER — Ambulatory Visit: Payer: Managed Care, Other (non HMO) | Admitting: Physician Assistant

## 2023-07-25 DIAGNOSIS — Z79899 Other long term (current) drug therapy: Secondary | ICD-10-CM

## 2023-07-25 DIAGNOSIS — M461 Sacroiliitis, not elsewhere classified: Secondary | ICD-10-CM

## 2023-07-25 DIAGNOSIS — Z603 Acculturation difficulty: Secondary | ICD-10-CM

## 2023-07-25 DIAGNOSIS — D269 Other benign neoplasm of uterus, unspecified: Secondary | ICD-10-CM

## 2023-07-25 DIAGNOSIS — M542 Cervicalgia: Secondary | ICD-10-CM

## 2023-07-25 DIAGNOSIS — M546 Pain in thoracic spine: Secondary | ICD-10-CM

## 2023-07-25 DIAGNOSIS — G8929 Other chronic pain: Secondary | ICD-10-CM

## 2023-07-25 DIAGNOSIS — H209 Unspecified iridocyclitis: Secondary | ICD-10-CM

## 2023-07-25 DIAGNOSIS — H04123 Dry eye syndrome of bilateral lacrimal glands: Secondary | ICD-10-CM

## 2023-07-25 DIAGNOSIS — M457 Ankylosing spondylitis of lumbosacral region: Secondary | ICD-10-CM

## 2023-08-06 ENCOUNTER — Telehealth: Payer: Self-pay | Admitting: Rheumatology

## 2023-08-06 NOTE — Telephone Encounter (Signed)
Reached out to patient and scheduled a follow up visit for 08/29/2023 at 9:30 am. Patient advised she will need to come this week for labs. Patient advised we are very limited on our samples and would not be able to provide one to her. Patient advised since she does fill her Humira with [facility] and we can send the prescription once she comes in for labs. Patient advised they should be able to get the prescription to her quickly.

## 2023-08-06 NOTE — Telephone Encounter (Signed)
Patient called requesting to stop by the office to pick up a sample of Humira.  Patient states she plans to come in the office on Friday 08/08/23 to have labwork and would like to pick up the sample at that time.  Patient declined scheduling an appointment over the phone today, but stated she would schedule when she comes in on Friday.

## 2023-08-11 ENCOUNTER — Other Ambulatory Visit (HOSPITAL_COMMUNITY): Payer: Self-pay

## 2023-08-11 ENCOUNTER — Other Ambulatory Visit: Payer: Self-pay | Admitting: *Deleted

## 2023-08-11 ENCOUNTER — Other Ambulatory Visit: Payer: Self-pay

## 2023-08-11 DIAGNOSIS — Z79899 Other long term (current) drug therapy: Secondary | ICD-10-CM

## 2023-08-11 DIAGNOSIS — H209 Unspecified iridocyclitis: Secondary | ICD-10-CM

## 2023-08-11 DIAGNOSIS — Z111 Encounter for screening for respiratory tuberculosis: Secondary | ICD-10-CM

## 2023-08-11 DIAGNOSIS — M457 Ankylosing spondylitis of lumbosacral region: Secondary | ICD-10-CM

## 2023-08-11 DIAGNOSIS — Z9225 Personal history of immunosupression therapy: Secondary | ICD-10-CM

## 2023-08-11 DIAGNOSIS — M461 Sacroiliitis, not elsewhere classified: Secondary | ICD-10-CM

## 2023-08-11 MED ORDER — HUMIRA (2 PEN) 40 MG/0.4ML ~~LOC~~ AJKT
40.0000 mg | AUTO-INJECTOR | SUBCUTANEOUS | 2 refills | Status: DC
  Filled 2023-08-11 – 2023-08-20 (×2): qty 2, 28d supply, fill #0
  Filled 2023-08-29: qty 2, 28d supply, fill #1
  Filled 2023-09-23 – 2023-10-22 (×2): qty 2, 28d supply, fill #2

## 2023-08-11 NOTE — Telephone Encounter (Signed)
Last Fill: 06/02/2023  Labs: 04/23/2023 White cell count is low at 3.1.  CMP is normal.  Sed rate is normal.     TB Gold: 09/16/2022 Neg    Next Visit: 08/29/2023  Last Visit: 04/23/2023  DX:Ankylosing spondylitis of lumbosacral region   Current Dose per office note 04/23/2023: Humira 40mg  subcut every 14 days   Patient update labs in office today.   Okay to refill Humira?

## 2023-08-12 NOTE — Progress Notes (Signed)
Absolute eosinophils are low. Rest of CBC WNL.   CMP stable.

## 2023-08-13 LAB — CBC WITH DIFFERENTIAL/PLATELET
Absolute Lymphocytes: 1989 {cells}/uL (ref 850–3900)
Absolute Monocytes: 342 {cells}/uL (ref 200–950)
Basophils Absolute: 32 {cells}/uL (ref 0–200)
Basophils Relative: 0.7 %
Eosinophils Absolute: 9 {cells}/uL — ABNORMAL LOW (ref 15–500)
Eosinophils Relative: 0.2 %
HCT: 39.1 % (ref 35.0–45.0)
Hemoglobin: 13 g/dL (ref 11.7–15.5)
MCH: 29.3 pg (ref 27.0–33.0)
MCHC: 33.2 g/dL (ref 32.0–36.0)
MCV: 88.3 fL (ref 80.0–100.0)
MPV: 10.1 fL (ref 7.5–12.5)
Monocytes Relative: 7.6 %
Neutro Abs: 2129 {cells}/uL (ref 1500–7800)
Neutrophils Relative %: 47.3 %
Platelets: 309 10*3/uL (ref 140–400)
RBC: 4.43 10*6/uL (ref 3.80–5.10)
RDW: 12 % (ref 11.0–15.0)
Total Lymphocyte: 44.2 %
WBC: 4.5 10*3/uL (ref 3.8–10.8)

## 2023-08-13 LAB — COMPLETE METABOLIC PANEL WITH GFR
AG Ratio: 1.5 (calc) (ref 1.0–2.5)
ALT: 10 U/L (ref 6–29)
AST: 13 U/L (ref 10–30)
Albumin: 4.5 g/dL (ref 3.6–5.1)
Alkaline phosphatase (APISO): 31 U/L (ref 31–125)
BUN/Creatinine Ratio: 22 (calc) (ref 6–22)
BUN: 11 mg/dL (ref 7–25)
CO2: 29 mmol/L (ref 20–32)
Calcium: 9.4 mg/dL (ref 8.6–10.2)
Chloride: 103 mmol/L (ref 98–110)
Creat: 0.49 mg/dL — ABNORMAL LOW (ref 0.50–0.97)
Globulin: 3 g/dL (ref 1.9–3.7)
Glucose, Bld: 103 mg/dL — ABNORMAL HIGH (ref 65–99)
Potassium: 4 mmol/L (ref 3.5–5.3)
Sodium: 137 mmol/L (ref 135–146)
Total Bilirubin: 0.4 mg/dL (ref 0.2–1.2)
Total Protein: 7.5 g/dL (ref 6.1–8.1)
eGFR: 124 mL/min/{1.73_m2} (ref >=60)

## 2023-08-13 LAB — QUANTIFERON-TB GOLD PLUS
Mitogen-NIL: 8.6 [IU]/mL
NIL: 0.03 [IU]/mL
QuantiFERON-TB Gold Plus: NEGATIVE
TB1-NIL: 0 [IU]/mL
TB2-NIL: 0 [IU]/mL

## 2023-08-13 NOTE — Progress Notes (Signed)
 TB Gold is negative.

## 2023-08-15 NOTE — Progress Notes (Signed)
 Office Visit Note  Patient: Kathleen Nguyen             Date of Birth: [birth year]           MRN: [id number]             PCP: Grayce Sessions, NP Referring: Grayce Sessions, NP Visit Date: 08/29/2023 Occupation: @GUAROCC @  Interpreter: Lenoard Aden  Subjective:  Medication monitoring   History of Present Illness: Latrece Nitta is a 38 y.o. female with history of ankylosing spondylitis.  Patient remains on Humira 40mg  sq injections every 14 days.  Patient reports that she had a With her last injection and the medication travel down her leg.  Patient states that she had another injectable pen at home and self administered the same day.  Patient states that she is due for her next dose of Humira in 10 days but does not have a plan to inject.  Patient states that overall Humira has been easier to administer than Cimzia.  She denies any recent flares.  She has occasional discomfort and stiffness but denies any joint swelling.  She denies any nocturnal pain.  She denies any uveitis flares.  She has been using prednisolone eyedrops once daily in the right eye as advised by ophthalmology.  She denies any eye pain, redness, photophobia at this time.  She has not been taking oral diclofenac recently.  She takes ibuprofen sparingly.  She denies any new medical conditions.  She denies any recent or recurrent infections.    Activities of Daily Living:  Patient reports morning stiffness for 5 minutes occasionally.  Patient Denies nocturnal pain.  Difficulty dressing/grooming: Denies Difficulty climbing stairs: Denies Difficulty getting out of chair: Denies Difficulty using hands for taps, buttons, cutlery, and/or writing: Denies  Review of Systems  Constitutional:  Positive for fatigue.  HENT:  Positive for mouth dryness and nose dryness. Negative for mouth sores.   Eyes:  Positive for discharge and dryness. Negative for pain.  Respiratory:  Negative for shortness of breath and difficulty  breathing.   Cardiovascular:  Negative for chest pain and palpitations.  Gastrointestinal:  Negative for blood in stool, constipation and diarrhea.  Endocrine: Negative for increased urination.  Genitourinary:  Negative for involuntary urination.  Musculoskeletal:  Positive for morning stiffness. Negative for joint pain, gait problem, joint pain, joint swelling, myalgias, muscle weakness, muscle tenderness and myalgias.  Skin:  Positive for color change, rash, hair loss and sensitivity to sunlight.  Allergic/Immunologic: Negative for susceptible to infections.  Neurological:  Positive for headaches. Negative for dizziness.  Hematological:  Negative for swollen glands.  Psychiatric/Behavioral:  Positive for sleep disturbance. Negative for depressed mood. The patient is not nervous/anxious.     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   Anemia in pregnancy 12/07/2018   Language barrier 12/04/2018   History of cesarean delivery 12/04/2018   Ankylosing spondylitis (HCC) 11/11/2018   Uterine fibroid 12/11/2016    Past Medical History:  Diagnosis Date   Anemia    Ankylosing spondylitis (HCC)    Ankylosing spondylitis (HCC)    Rheumatoid arthritis (HCC)     Family History  Problem Relation Age of Onset   Arthritis/Rheumatoid Mother    Past Surgical History:  Procedure Laterality Date   CESAREAN SECTION     CESAREAN SECTION     CESAREAN SECTION N/A 05/03/2019   Procedure: CESAREAN  SECTION;  Surgeon: Adam Phenix, MD;  Location: [facility] LD ORS;  Service: Obstetrics;  Laterality: N/A;   Social History   Social History Narrative   Not on file   Immunization History  Administered Date(s) Administered   Influenza,inj,Quad PF,6+ Mos 02/22/2019   Janssen (J&J) SARS-COV-2 Vaccination 09/18/2019   PFIZER(Purple Top)SARS-COV-2 Vaccination 05/08/2020   Tdap 12/18/2017, 02/22/2019      Objective: Vital Signs: BP 108/77 (BP Location: Left Arm, Patient Position: Sitting, Cuff Size: Normal)   Pulse 93   Resp 15   Ht 5\' 5"  (1.651 m)   Wt 167 lb 3.2 oz (75.8 kg)   BMI 27.82 kg/m    Physical Exam Vitals and nursing note reviewed.  Constitutional:      Appearance: She is well-developed.  HENT:     Head: Normocephalic and atraumatic.  Eyes:     Conjunctiva/sclera: Conjunctivae normal.  Cardiovascular:     Rate and Rhythm: Normal rate and regular rhythm.     Heart sounds: Normal heart sounds.  Pulmonary:     Effort: Pulmonary effort is normal.     Breath sounds: Normal breath sounds.  Abdominal:     General: Bowel sounds are normal.     Palpations: Abdomen is soft.  Musculoskeletal:     Cervical back: Normal range of motion.  Lymphadenopathy:     Cervical: No cervical adenopathy.  Skin:    General: Skin is warm and dry.     Capillary Refill: Capillary refill takes less than 2 seconds.  Neurological:     Mental Status: She is alert and oriented to person, place, and time.  Psychiatric:        Behavior: Behavior normal.      Musculoskeletal Exam: C-spine has good range of motion.  Some midline spinal tenderness in the lumbar region.  No SI joint tenderness upon palpation.  Shoulder joints, elbow joints, wrist joints MCPs, PIPs, DIPs have good range of motion with no synovitis.  Complete fist formation bilaterally.  Hip joints have good range of motion with no groin pain.  Knee joints have good range of motion with no warmth or effusion.  Ankle joints have good range of motion with no tenderness or joint swelling.  No evidence of Achilles tendinitis or plantar fasciitis.  CDAI Exam: CDAI Score: -- Patient Global: --; Provider Global: -- Swollen: --; Tender: -- Joint Exam 08/29/2023   No joint exam has been documented for this visit   There is currently no information documented on the homunculus. Go to the Rheumatology activity and complete the  homunculus joint exam.  Investigation: No additional findings.  Imaging: No results found.  Recent Labs: Lab Results  Component Value Date   WBC 4.5 08/11/2023   HGB 13.0 08/11/2023   PLT 309 08/11/2023   NA 137 08/11/2023   K 4.0 08/11/2023   CL 103 08/11/2023   CO2 29 08/11/2023   GLUCOSE 103 (H) 08/11/2023   BUN 11 08/11/2023   CREATININE 0.49 (L) 08/11/2023   BILITOT 0.4 08/11/2023   ALKPHOS 29 (L) 12/05/2021   AST 13 08/11/2023   ALT 10 08/11/2023   PROT 7.5 08/11/2023   ALBUMIN 3.7 12/05/2021   CALCIUM 9.4 08/11/2023   GFRAA 144 10/25/2020   QFTBGOLDPLUS NEGATIVE 08/11/2023    Speciality Comments: Humira started 05/12/23 (uveitis dose)  Procedures:  No procedures performed Allergies: Patient has no known allergies.    Assessment / Plan:     Visit Diagnoses: Ankylosing spondylitis of lumbosacral region (  HCC): She has not had any signs or symptoms of a flare.  No synovitis or dactylitis noted.  No evidence of Achilles tendinitis or plantar fasciitis.  No recent iritis flares.  She is clinically been doing well on Humira 40 mg subcutaneous injections every 14 days.  Humira was started on 05/02/2023.  She is tolerating Humira without any side effects and has not had any injection site reactions.  With her most recent injection she had a mishap with the device and the medication dribble down her leg.  She had an extra device at home and tried readministering Humira without difficulty.  She is due for her next dose of Humira in 10 days.  She will require a replacement pen and refill of Humira.  She is advised to notify us if she develops any signs or symptoms of a flare.  She will follow-up in the office in 3 months or sooner if needed.  Sacroiliitis (HCC): She has no SI joint tenderness upon palpation today. She will remain on humira as prescribed.   High risk medication use - Humira 40mg  sq injections every 14 days. Humira was started on the loading dose of Humira  05/02/2023. Previous therapy: Cimzia.  Using IUD.  CBC and CMP updated on 08/11/23. Her next lab work will be due in May and every 3 months.  TB gold negative on 08/11/23.  Disucssed the importance of holding humira if she develops signs or symptoms of an infections.   Neck pain: C-spine has good range of motion with no discomfort.  No symptoms of radiculopathy.  Pain in thoracic spine: No midline spinal tenderness in the thoracic region.  Chronic pain of both knees: She has occasional discomfort and stiffness in both knees.  On examination today she has good range of motion of both knee joints.  No warmth or effusion noted.  Iritis -Under care of ophthalmology.  She has been using prednisolone eyedrops once daily in the right eye as advised by ophthalmology.  She is not currently experiencing eye pain, photophobia, or conjunctival injection.  Patient is currently on Humira 40 mg sq injections every 14 days.  No medication changes will be made at this time.   Other medical conditions are listed as follows:  Dry eyes: Chronic, stable.   Uterine adenomyoma - Patient was evaluated in the ED on 03/30/2021, 07/30/2021, on 12/05/2021 for pelvic pain.  Language barrier: Interpreter was present throughout the entirety of the visit.  Orders: No orders of the defined types were placed in this encounter.  No orders of the defined types were placed in this encounter.  Follow-Up Instructions: Return in about 3 months (around 11/29/2023) for Ankylosing Spondylitis.   Gearldine Bienenstock, PA-C  Note - This record has been created using Dragon software.  Chart creation errors have been sought, but may not always  have been located. Such creation errors do not reflect on  the standard of medical care.

## 2023-08-20 ENCOUNTER — Other Ambulatory Visit: Payer: Self-pay

## 2023-08-20 NOTE — Progress Notes (Signed)
 Specialty Pharmacy Refill Coordination Note  Kathleen Nguyen is a 38 y.o. female contacted today regarding refills of specialty medication(s) Adalimumab (Humira (2 Pen))   Patient requested Delivery   Delivery date: 08/22/23   Verified address: [address]   [name] [address]   Medication will be filled on 08/21/23.

## 2023-08-21 ENCOUNTER — Other Ambulatory Visit: Payer: Self-pay

## 2023-08-29 ENCOUNTER — Encounter: Payer: Self-pay | Admitting: Physician Assistant

## 2023-08-29 ENCOUNTER — Other Ambulatory Visit: Payer: Self-pay

## 2023-08-29 ENCOUNTER — Telehealth: Payer: Self-pay | Admitting: Pharmacist

## 2023-08-29 ENCOUNTER — Ambulatory Visit: Payer: Medicaid Other | Attending: Physician Assistant | Admitting: Physician Assistant

## 2023-08-29 VITALS — BP 108/77 | HR 93 | Resp 15 | Ht 65.0 in | Wt 167.2 lb

## 2023-08-29 DIAGNOSIS — M542 Cervicalgia: Secondary | ICD-10-CM | POA: Diagnosis not present

## 2023-08-29 DIAGNOSIS — D269 Other benign neoplasm of uterus, unspecified: Secondary | ICD-10-CM

## 2023-08-29 DIAGNOSIS — M25562 Pain in left knee: Secondary | ICD-10-CM

## 2023-08-29 DIAGNOSIS — M457 Ankylosing spondylitis of lumbosacral region: Secondary | ICD-10-CM | POA: Diagnosis not present

## 2023-08-29 DIAGNOSIS — M25561 Pain in right knee: Secondary | ICD-10-CM

## 2023-08-29 DIAGNOSIS — H04123 Dry eye syndrome of bilateral lacrimal glands: Secondary | ICD-10-CM

## 2023-08-29 DIAGNOSIS — Z603 Acculturation difficulty: Secondary | ICD-10-CM

## 2023-08-29 DIAGNOSIS — Z79899 Other long term (current) drug therapy: Secondary | ICD-10-CM

## 2023-08-29 DIAGNOSIS — M546 Pain in thoracic spine: Secondary | ICD-10-CM

## 2023-08-29 DIAGNOSIS — Z758 Other problems related to medical facilities and other health care: Secondary | ICD-10-CM

## 2023-08-29 DIAGNOSIS — M461 Sacroiliitis, not elsewhere classified: Secondary | ICD-10-CM

## 2023-08-29 DIAGNOSIS — G8929 Other chronic pain: Secondary | ICD-10-CM

## 2023-08-29 DIAGNOSIS — H209 Unspecified iridocyclitis: Secondary | ICD-10-CM

## 2023-08-29 NOTE — Patient Instructions (Signed)
 Standing Labs We placed an order today for your standing lab work.   Please have your standing labs drawn in [place] and every 3 months   Please have your labs drawn 2 weeks prior to your appointment so that the provider can discuss your lab results at your appointment, if possible.  Please note that you [place] see your imaging and lab results in MyChart before we have reviewed them. We will contact you once all results are reviewed. Please allow our office up to 72 hours to thoroughly review all of the results before contacting the office for clarification of your results.  WALK-IN LAB HOURS  Monday through Thursday from 8:00 am -12:30 pm and 1:00 pm-5:00 pm and Friday from 8:00 am-12:00 pm.  Patients with office visits requiring labs will be seen before walk-in labs.  You [place]es encounter longer than normal wait times. Please allow additional time. Wait times [place] be shorter on  Monday and Thursday afternoons.  We do not book appointments for walk-in labs. We appreciate your patience and understanding with our staff.   Labs are drawn by [facility]. Please bring your co-pay at the time of your lab draw.  You [place]aj receive a bill from [facility] for your lab work.  Please note if you are on Hydroxychloroquine and and an order has been placed for a Hydroxychloroquine level,  you will need to have it drawn 4 hours or more after your last dose.  If you wish to have your labs drawn at another location, please call the office 24 hours in advance so we can fax the orders.  The office is located at [address], [address], [place], [place] [address]   If you have any questions regarding directions or hours of operation,  please call [phone].   As a reminder, please drink plenty of water prior to coming for your lab work. Thanks!

## 2023-08-29 NOTE — Progress Notes (Signed)
 Clinical Intervention Note  Clinical Intervention Notes: [facility] notified pharmacy that patient misfired Humira. Dose is due in 10 days. Insurance will pay for next refill on 3/12. Called and spoke with patient via phone with Arabic Interpreter Ahmed [phone]). Confirmed address with patient. Patient will recieve injection in time for next dose. Provided [employer] phone number to patient [phone]) so patient can ask for replacement. Advised patient to keep misfired pen in case manfacturer asks questtions about the pen or request a return. Patient expressed understanding and stated she had no other questions or concerns at this time.   Clinical Intervention Outcomes: Improved therapy adherence   Bobette Mo Specialty Pharmacist

## 2023-08-29 NOTE — Telephone Encounter (Signed)
 Per Jacki Cones at [facility] pharmacy: Her insurance will let her fill on 3/12 so we can go ahead and que it for that date and can also provide manufacturer number to see if they will replace the misfired Humira.  Chesley Mires, PharmD, MPH, BCPS, CPP Clinical Pharmacist (Rheumatology and Pulmonology)

## 2023-08-29 NOTE — Telephone Encounter (Signed)
 Received message from [place], CMA:  Patient received humira rx from [facility] and medication "spilled out" of one of the pens when she went to inject. So she had to use the other pen. can the pharmacy do an override for an early fill for the next refill or what do we need to do?   Will notify pharmacy to request override (message Esperanza Richters). We are short on samples due to biosimilar.  Patient is due for next dose in 10 days from today.  Will await response  Chesley Mires, PharmD, MPH, BCPS, CPP Clinical Pharmacist (Rheumatology and Pulmonology)

## 2023-09-03 ENCOUNTER — Other Ambulatory Visit: Payer: Self-pay

## 2023-09-17 ENCOUNTER — Other Ambulatory Visit: Payer: Self-pay

## 2023-09-23 ENCOUNTER — Other Ambulatory Visit (HOSPITAL_COMMUNITY): Payer: Self-pay

## 2023-09-23 ENCOUNTER — Other Ambulatory Visit: Payer: Self-pay

## 2023-09-23 ENCOUNTER — Other Ambulatory Visit: Payer: Self-pay | Admitting: Pharmacy Technician

## 2023-09-23 NOTE — Progress Notes (Signed)
 Specialty Pharmacy Refill Coordination Note  Kathleen Nguyen is a 38 y.o. female contacted today regarding refills of specialty medication(s) Adalimumab (Humira (2 Pen))   Patient requested Delivery   Delivery date: 09/25/23   Verified address: [address]  [name] [address]   Medication will be filled on 09/24/23.

## 2023-09-24 ENCOUNTER — Other Ambulatory Visit: Payer: Self-pay

## 2023-10-22 ENCOUNTER — Other Ambulatory Visit (HOSPITAL_COMMUNITY): Payer: Self-pay | Admitting: Pharmacy Technician

## 2023-10-22 ENCOUNTER — Other Ambulatory Visit (HOSPITAL_COMMUNITY): Payer: Self-pay

## 2023-10-22 NOTE — Progress Notes (Signed)
 Specialty Pharmacy Refill Coordination Note  Kathleen Nguyen is a 38 y.o. female contacted today regarding refills of specialty medication(s) Adalimumab  (Humira  (2 Pen))   Patient requested Delivery   Delivery date: 10/31/23   Verified address: [address] [place] [address]   Medication will be filled on 10/30/23.   Spoke to interpreter.

## 2023-10-30 ENCOUNTER — Other Ambulatory Visit: Payer: Self-pay

## 2023-11-13 ENCOUNTER — Other Ambulatory Visit (HOSPITAL_COMMUNITY): Payer: Self-pay

## 2023-11-18 NOTE — Progress Notes (Unsigned)
 Office Visit Note  Patient: Kathleen Nguyen             Date of Birth: [birth year]           MRN: [id number]             PCP: Marius Siemens, NP Referring: Marius Siemens, NP Visit Date: 12/02/2023 Occupation: @GUAROCC @  Subjective:    History of Present Illness: Janna Oak is a 38 y.o. female with history of ankylosing spondylitis.  Patient is currently on  - Humira  40mg  sq injections every 14 days.   CBC and CMP updated on 08/11/23. Orders for CBC and CMP released today. TB gold negative on 08/11/23.  Discussed the importance of holding humira  if she develops signs or symptoms of an infection and to resume once the infection has completely cleared.   Activities of Daily Living:  Patient reports morning stiffness for *** {minute/hour:19697}.   Patient {ACTIONS;DENIES/REPORTS:21021675::"Denies"} nocturnal pain.  Difficulty dressing/grooming: {ACTIONS;DENIES/REPORTS:21021675::"Denies"} Difficulty climbing stairs: {ACTIONS;DENIES/REPORTS:21021675::"Denies"} Difficulty getting out of chair: {ACTIONS;DENIES/REPORTS:21021675::"Denies"} Difficulty using hands for taps, buttons, cutlery, and/or writing: {ACTIONS;DENIES/REPORTS:21021675::"Denies"}  No Rheumatology ROS completed.   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   Anemia in pregnancy 12/07/2018   Language barrier 12/04/2018   History of cesarean delivery 12/04/2018   Ankylosing spondylitis (HCC) 11/11/2018   Uterine fibroid 12/11/2016    Past Medical History:  Diagnosis Date   Anemia    Ankylosing spondylitis (HCC)    Ankylosing spondylitis (HCC)    Rheumatoid arthritis (HCC)     Family History  Problem Relation Age of Onset   Arthritis/Rheumatoid Mother    Past Surgical History:  Procedure Laterality Date   CESAREAN SECTION     CESAREAN SECTION     CESAREAN SECTION N/A 05/03/2019   Procedure:  CESAREAN SECTION;  Surgeon: Tresia Fruit, MD;  Location: [facility] LD ORS;  Service: Obstetrics;  Laterality: N/A;   Social History   Social History Narrative   Not on file   Immunization History  Administered Date(s) Administered   Influenza,inj,Quad PF,6+ Mos 02/22/2019   Janssen (J&J) SARS-COV-2 Vaccination 09/18/2019   PFIZER(Purple Top)SARS-COV-2 Vaccination 05/08/2020   Tdap 12/18/2017, 02/22/2019     Objective: Vital Signs: There were no vitals taken for this visit.   Physical Exam Vitals and nursing note reviewed.  Constitutional:      Appearance: She is well-developed.  HENT:     Head: Normocephalic and atraumatic.  Eyes:     Conjunctiva/sclera: Conjunctivae normal.  Cardiovascular:     Rate and Rhythm: Normal rate and regular rhythm.     Heart sounds: Normal heart sounds.  Pulmonary:     Effort: Pulmonary effort is normal.     Breath sounds: Normal breath sounds.  Abdominal:     General: Bowel sounds are normal.     Palpations: Abdomen is soft.  Musculoskeletal:     Cervical back: Normal range of motion.  Lymphadenopathy:     Cervical: No cervical adenopathy.  Skin:    General: Skin is warm and dry.     Capillary Refill: Capillary refill takes less than 2 seconds.  Neurological:     Mental Status: She is alert and oriented to person, place, and time.  Psychiatric:        Behavior: Behavior normal.      Musculoskeletal Exam: ***  CDAI Exam: CDAI Score: -- Patient Global: --;  Provider Global: -- Swollen: --; Tender: -- Joint Exam 12/02/2023   No joint exam has been documented for this visit   There is currently no information documented on the homunculus. Go to the Rheumatology activity and complete the homunculus joint exam.  Investigation: No additional findings.  Imaging: No results found.  Recent Labs: Lab Results  Component Value Date   WBC 4.5 08/11/2023   HGB 13.0 08/11/2023   PLT 309 08/11/2023   NA 137 08/11/2023   K 4.0  08/11/2023   CL 103 08/11/2023   CO2 29 08/11/2023   GLUCOSE 103 (H) 08/11/2023   BUN 11 08/11/2023   CREATININE 0.49 (L) 08/11/2023   BILITOT 0.4 08/11/2023   ALKPHOS 29 (L) 12/05/2021   AST 13 08/11/2023   ALT 10 08/11/2023   PROT 7.5 08/11/2023   ALBUMIN 3.7 12/05/2021   CALCIUM  9.4 08/11/2023   GFRAA 144 10/25/2020   QFTBGOLDPLUS NEGATIVE 08/11/2023    Speciality Comments: Humira  started 05/12/23 (uveitis dose)  Procedures:  No procedures performed Allergies: Patient has no known allergies.   Assessment / Plan:     Visit Diagnoses: Ankylosing spondylitis of lumbosacral region (HCC)  Sacroiliitis (HCC)  High risk medication use  Neck pain  Pain in thoracic spine  Chronic pain of both knees  Iritis  Dry eyes  Uterine adenomyoma  Language barrier  Orders: No orders of the defined types were placed in this encounter.  No orders of the defined types were placed in this encounter.   Face-to-face time spent with patient was *** minutes. Greater than 50% of time was spent in counseling and coordination of care.  Follow-Up Instructions: No follow-ups on file.   Romayne Clubs, PA-C  Note - This record has been created using Dragon software.  Chart creation errors have been sought, but may not always  have been located. Such creation errors do not reflect on  the standard of medical care.

## 2023-11-20 ENCOUNTER — Other Ambulatory Visit: Payer: Self-pay

## 2023-11-20 ENCOUNTER — Other Ambulatory Visit: Payer: Self-pay | Admitting: Physician Assistant

## 2023-11-20 DIAGNOSIS — H209 Unspecified iridocyclitis: Secondary | ICD-10-CM

## 2023-11-20 DIAGNOSIS — Z79899 Other long term (current) drug therapy: Secondary | ICD-10-CM

## 2023-11-20 DIAGNOSIS — M457 Ankylosing spondylitis of lumbosacral region: Secondary | ICD-10-CM

## 2023-11-20 MED ORDER — HUMIRA (2 PEN) 40 MG/0.4ML ~~LOC~~ AJKT
40.0000 mg | AUTO-INJECTOR | SUBCUTANEOUS | 2 refills | Status: DC
Start: 2023-11-20 — End: 2024-03-01
  Filled 2023-11-20: qty 2, 28d supply, fill #0
  Filled 2023-12-17: qty 2, 28d supply, fill #1
  Filled 2024-01-30: qty 2, 28d supply, fill #2

## 2023-11-20 NOTE — Telephone Encounter (Signed)
 Last Fill: 08/11/2023  Labs: 08/11/2023 Absolute eosinophils are low. Rest of CBC WNL.   CMP stable.  TB Gold: 08/11/2023 Neg    Next Visit: 12/02/2023  Last Visit: 08/29/2023  DX: Ankylosing spondylitis of lumbosacral region   Current Dose per office note 08/29/2023: Humira  40 mg subcutaneous injections every 14 days.   Patient to update labs at upcoming appointment on 12/02/2023.   Okay to refill Humira ?

## 2023-11-20 NOTE — Progress Notes (Signed)
 Spoke with arabic interpreter ID# [id number].   Specialty Pharmacy Refill Coordination Note  Romayne Ticas is a 38 y.o. female contacted today regarding refills of specialty medication(s) Adalimumab  (Humira  (2 Pen))   Patient requested Delivery   Delivery date: 11/27/23   Verified address: [address] [place] [address]   Medication will be filled on 11/26/23. This fill date is pending response to refill request from provider. Patient is aware and if they have not received fill by intended date they must follow up with pharmacy.

## 2023-11-20 NOTE — Progress Notes (Signed)
 Specialty Pharmacy Ongoing Clinical Assessment Note  Kathleen Nguyen is a 38 y.o. female who is being followed by the specialty pharmacy service for RxSp Ankylosing Spondylitis   Patient's specialty medication(s) reviewed today: Adalimumab  (Humira  (2 Pen))   Missed doses in the last 4 weeks: 0   Patient/Caregiver did not have any additional questions or concerns.   Therapeutic benefit summary: Patient is achieving benefit   Adverse events/side effects summary: No adverse events/side effects   Patient's therapy is appropriate to: Continue    Goals Addressed             This Visit's Progress    Maintain optimal adherence to therapy   On track    Patient is initiating therapy. Patient will work on increased adherence and adhere to provider and/or lab appointments      Minimize recurrence of flares   On track    Patient is on track. Patient will maintain adherence and be monitored by provider to determine if a change in treatment plan is warranted. Patient stated that she feels the medication is treating her condition well, but concerned with the amount of fatigue and tiredness that she is experiencing.         Follow up: 6 months  [facility]

## 2023-11-21 ENCOUNTER — Other Ambulatory Visit: Payer: Self-pay

## 2023-11-26 ENCOUNTER — Other Ambulatory Visit: Payer: Self-pay

## 2023-12-02 ENCOUNTER — Encounter: Payer: Self-pay | Admitting: Physician Assistant

## 2023-12-02 ENCOUNTER — Encounter: Payer: Self-pay | Admitting: *Deleted

## 2023-12-02 ENCOUNTER — Ambulatory Visit: Attending: Physician Assistant | Admitting: Physician Assistant

## 2023-12-02 VITALS — BP 106/70 | HR 90 | Resp 16 | Ht 65.0 in | Wt 158.4 lb

## 2023-12-02 DIAGNOSIS — M542 Cervicalgia: Secondary | ICD-10-CM | POA: Diagnosis not present

## 2023-12-02 DIAGNOSIS — H209 Unspecified iridocyclitis: Secondary | ICD-10-CM

## 2023-12-02 DIAGNOSIS — M461 Sacroiliitis, not elsewhere classified: Secondary | ICD-10-CM | POA: Diagnosis not present

## 2023-12-02 DIAGNOSIS — Z79899 Other long term (current) drug therapy: Secondary | ICD-10-CM | POA: Diagnosis not present

## 2023-12-02 DIAGNOSIS — Z758 Other problems related to medical facilities and other health care: Secondary | ICD-10-CM

## 2023-12-02 DIAGNOSIS — M457 Ankylosing spondylitis of lumbosacral region: Secondary | ICD-10-CM | POA: Diagnosis not present

## 2023-12-02 DIAGNOSIS — G8929 Other chronic pain: Secondary | ICD-10-CM

## 2023-12-02 DIAGNOSIS — M25562 Pain in left knee: Secondary | ICD-10-CM

## 2023-12-02 DIAGNOSIS — M6289 Other specified disorders of muscle: Secondary | ICD-10-CM

## 2023-12-02 DIAGNOSIS — H04123 Dry eye syndrome of bilateral lacrimal glands: Secondary | ICD-10-CM

## 2023-12-02 DIAGNOSIS — Z603 Acculturation difficulty: Secondary | ICD-10-CM

## 2023-12-02 DIAGNOSIS — D269 Other benign neoplasm of uterus, unspecified: Secondary | ICD-10-CM

## 2023-12-02 DIAGNOSIS — M25561 Pain in right knee: Secondary | ICD-10-CM

## 2023-12-02 DIAGNOSIS — M546 Pain in thoracic spine: Secondary | ICD-10-CM

## 2023-12-02 DIAGNOSIS — R5383 Other fatigue: Secondary | ICD-10-CM

## 2023-12-02 LAB — CBC WITH DIFFERENTIAL/PLATELET
Absolute Lymphocytes: 1924 {cells}/uL (ref 850–3900)
Absolute Monocytes: 304 {cells}/uL (ref 200–950)
Basophils Absolute: 20 {cells}/uL (ref 0–200)
Basophils Relative: 0.5 %
Eosinophils Absolute: 20 {cells}/uL (ref 15–500)
Eosinophils Relative: 0.5 %
HCT: 39.8 % (ref 35.0–45.0)
Hemoglobin: 12.6 g/dL (ref 11.7–15.5)
MCH: 28.6 pg (ref 27.0–33.0)
MCHC: 31.7 g/dL — ABNORMAL LOW (ref 32.0–36.0)
MCV: 90.2 fL (ref 80.0–100.0)
MPV: 10 fL (ref 7.5–12.5)
Monocytes Relative: 7.6 %
Neutro Abs: 1732 {cells}/uL (ref 1500–7800)
Neutrophils Relative %: 43.3 %
Platelets: 301 10*3/uL (ref 140–400)
RBC: 4.41 10*6/uL (ref 3.80–5.10)
RDW: 12.7 % (ref 11.0–15.0)
Total Lymphocyte: 48.1 %
WBC: 4 10*3/uL (ref 3.8–10.8)

## 2023-12-02 LAB — COMPREHENSIVE METABOLIC PANEL WITH GFR
AG Ratio: 1.5 (calc) (ref 1.0–2.5)
ALT: 9 U/L (ref 6–29)
AST: 9 U/L — ABNORMAL LOW (ref 10–30)
Albumin: 4.6 g/dL (ref 3.6–5.1)
Alkaline phosphatase (APISO): 28 U/L — ABNORMAL LOW (ref 31–125)
BUN: 18 mg/dL (ref 7–25)
CO2: 26 mmol/L (ref 20–32)
Calcium: 9.3 mg/dL (ref 8.6–10.2)
Chloride: 103 mmol/L (ref 98–110)
Creat: 0.53 mg/dL (ref 0.50–0.97)
Globulin: 3 g/dL (ref 1.9–3.7)
Glucose, Bld: 84 mg/dL (ref 65–99)
Potassium: 4.2 mmol/L (ref 3.5–5.3)
Sodium: 136 mmol/L (ref 135–146)
Total Bilirubin: 0.4 mg/dL (ref 0.2–1.2)
Total Protein: 7.6 g/dL (ref 6.1–8.1)
eGFR: 122 mL/min/{1.73_m2} (ref >=60)

## 2023-12-02 LAB — TSH: TSH: 0.41 m[IU]/L

## 2023-12-02 LAB — CK: Total CK: 45 U/L (ref 20–239)

## 2023-12-03 ENCOUNTER — Ambulatory Visit: Payer: Self-pay | Admitting: Physician Assistant

## 2023-12-03 NOTE — Progress Notes (Signed)
 CK and TSH WNL CBC WNL Alk phos is borderline low. We will continue to monitor.  No changes in therapy recommended at this time.

## 2023-12-08 ENCOUNTER — Other Ambulatory Visit: Payer: Self-pay

## 2023-12-17 ENCOUNTER — Other Ambulatory Visit: Payer: Self-pay

## 2023-12-17 ENCOUNTER — Other Ambulatory Visit (HOSPITAL_COMMUNITY): Payer: Self-pay

## 2023-12-17 NOTE — Progress Notes (Signed)
 Specialty Pharmacy Refill Coordination Note  Spoke with Sherrod Pinon (Self via interpreter [phone]) .   Kathleen Nguyen is a 38 y.o. female contacted today regarding refills of specialty medication(s) Adalimumab  (Humira  (2 Pen), Humira -Psoriasis/Uveit Starter)  Injection date: 01/13/24.   Patient requested: Delivery   Delivery date: 12/19/23   Verified address: [address]  [place] [address]  Medication will be filled on 12/18/23.

## 2023-12-18 ENCOUNTER — Other Ambulatory Visit: Payer: Self-pay

## 2024-01-30 ENCOUNTER — Other Ambulatory Visit: Payer: Self-pay

## 2024-01-30 NOTE — Progress Notes (Signed)
 Specialty Pharmacy Refill Coordination Note  Kathleen Nguyen is a 38 y.o. female contacted today regarding refills of specialty medication(s) Adalimumab  (Humira  (2 Pen), Humira -Psoriasis/Uveit Starter)   Patient requested Delivery   Delivery date: 02/03/24   Verified address: [address]  [place] [address]   Medication will be filled on 02/02/24.

## 2024-02-02 ENCOUNTER — Other Ambulatory Visit: Payer: Self-pay

## 2024-02-03 ENCOUNTER — Other Ambulatory Visit (HOSPITAL_COMMUNITY): Payer: Self-pay

## 2024-02-19 ENCOUNTER — Other Ambulatory Visit (HOSPITAL_COMMUNITY): Payer: Self-pay

## 2024-02-27 ENCOUNTER — Other Ambulatory Visit: Payer: Self-pay

## 2024-03-01 ENCOUNTER — Other Ambulatory Visit: Payer: Self-pay | Admitting: Rheumatology

## 2024-03-01 ENCOUNTER — Other Ambulatory Visit: Payer: Self-pay

## 2024-03-01 ENCOUNTER — Other Ambulatory Visit (HOSPITAL_COMMUNITY): Payer: Self-pay

## 2024-03-01 DIAGNOSIS — H209 Unspecified iridocyclitis: Secondary | ICD-10-CM

## 2024-03-01 DIAGNOSIS — M457 Ankylosing spondylitis of lumbosacral region: Secondary | ICD-10-CM

## 2024-03-01 DIAGNOSIS — Z79899 Other long term (current) drug therapy: Secondary | ICD-10-CM

## 2024-03-01 MED ORDER — HUMIRA (2 PEN) 40 MG/0.4ML ~~LOC~~ AJKT
40.0000 mg | AUTO-INJECTOR | SUBCUTANEOUS | 0 refills | Status: DC
  Filled 2024-03-01: qty 2, 28d supply, fill #0

## 2024-03-01 NOTE — Progress Notes (Addendum)
 PA required. Routed to Rheum/Pulm.  Specialty Pharmacy Refill Coordination Note  Kathleen Nguyen is a 38 y.o. female contacted today regarding refills of specialty medication(s) Adalimumab  (Humira  (2 Pen), Humira -Psoriasis/Uveit Starter)   Patient requested Delivery   Delivery date: 03/05/24   Verified address: [address]  [place] [address]   Medication will be filled on 03/04/24, pending refill approval.

## 2024-03-01 NOTE — Telephone Encounter (Addendum)
 Last Fill: 11/20/2023  Labs: 12/02/2023 CK and TSH WNL  CBC WNL  Alk phos is borderline low. We will continue to monitor.  No changes in therapy recommended at this time.   TB Gold: 08/11/2023 TB Gold Negative   Next Visit: 06/01/2024  Last Visit: 12/02/2023  DX: Ankylosing spondylitis of lumbosacral region   Current Dose per office note 12/02/2023: Humira  40mg  sq injections every 14 days   Okay to refill Humira ?   Contacted patient to advise we needed labs updated, patient could not talk at this time asked for a callback tomorrow.

## 2024-03-02 ENCOUNTER — Other Ambulatory Visit: Payer: Self-pay

## 2024-03-02 ENCOUNTER — Telehealth: Payer: Self-pay

## 2024-03-02 ENCOUNTER — Other Ambulatory Visit (HOSPITAL_COMMUNITY): Payer: Self-pay

## 2024-03-02 NOTE — Telephone Encounter (Signed)
 Cordie Haff R, CPhT to Rx Rheum/Pulm (Selected Message)  03/01/24  5:00 PM This patient needs a PA for their Humira . Hoping to ship 9/11. Appears the only coverage now is Healthy [employer].  Received notification from [facility] pharmacy that patient requires a new authorization for their medication.  Submitted an URGENT Prior Authorization request to [employer] for HUMIRA  via CoverMyMeds. Will update once we receive a response.  Key: [id number]

## 2024-03-02 NOTE — Telephone Encounter (Signed)
 Received notification from [employer] regarding a prior authorization for HUMIRA . Authorization has been APPROVED from 03/02/2024 to 03/02/2025. Approval letter sent to scan center.  Authorization # [id number]

## 2024-03-25 ENCOUNTER — Other Ambulatory Visit: Payer: Self-pay

## 2024-03-25 ENCOUNTER — Other Ambulatory Visit: Payer: Self-pay | Admitting: Physician Assistant

## 2024-03-25 ENCOUNTER — Other Ambulatory Visit (HOSPITAL_COMMUNITY): Payer: Self-pay

## 2024-03-25 ENCOUNTER — Telehealth: Payer: Self-pay | Admitting: Rheumatology

## 2024-03-25 DIAGNOSIS — M457 Ankylosing spondylitis of lumbosacral region: Secondary | ICD-10-CM

## 2024-03-25 DIAGNOSIS — Z79899 Other long term (current) drug therapy: Secondary | ICD-10-CM

## 2024-03-25 DIAGNOSIS — H209 Unspecified iridocyclitis: Secondary | ICD-10-CM

## 2024-03-25 DIAGNOSIS — Z9225 Personal history of immunosupression therapy: Secondary | ICD-10-CM

## 2024-03-25 DIAGNOSIS — Z111 Encounter for screening for respiratory tuberculosis: Secondary | ICD-10-CM

## 2024-03-25 NOTE — Telephone Encounter (Signed)
 Patient advised she is due to update her labs. Patient states she can come next week to update. Patient advised once labs are updated we can send in refill of her medication. Patient expressed understanding.

## 2024-03-25 NOTE — Telephone Encounter (Signed)
 Patient called stating the pharmacy told her that Dr. Dolphus denied refilling her Humira  medication.  Patient requested a return call.

## 2024-03-29 ENCOUNTER — Other Ambulatory Visit: Payer: Self-pay

## 2024-03-29 DIAGNOSIS — Z79899 Other long term (current) drug therapy: Secondary | ICD-10-CM

## 2024-03-29 LAB — CBC WITH DIFFERENTIAL/PLATELET
Absolute Lymphocytes: 2448 {cells}/uL (ref 850–3900)
Absolute Monocytes: 342 {cells}/uL (ref 200–950)
Basophils Absolute: 18 {cells}/uL (ref 0–200)
Basophils Relative: 0.4 %
Eosinophils Absolute: 32 {cells}/uL (ref 15–500)
Eosinophils Relative: 0.7 %
HCT: 39 % (ref 35.0–45.0)
Hemoglobin: 12.7 g/dL (ref 11.7–15.5)
MCH: 29.4 pg (ref 27.0–33.0)
MCHC: 32.6 g/dL (ref 32.0–36.0)
MCV: 90.3 fL (ref 80.0–100.0)
MPV: 10 fL (ref 7.5–12.5)
Monocytes Relative: 7.6 %
Neutro Abs: 1661 {cells}/uL (ref 1500–7800)
Neutrophils Relative %: 36.9 %
Platelets: 290 Thousand/uL (ref 140–400)
RBC: 4.32 Million/uL (ref 3.80–5.10)
RDW: 12 % (ref 11.0–15.0)
Total Lymphocyte: 54.4 %
WBC: 4.5 Thousand/uL (ref 3.8–10.8)

## 2024-03-29 LAB — COMPREHENSIVE METABOLIC PANEL WITH GFR
AG Ratio: 1.6 (calc) (ref 1.0–2.5)
ALT: 10 U/L (ref 6–29)
AST: 11 U/L (ref 10–30)
Albumin: 4.8 g/dL (ref 3.6–5.1)
Alkaline phosphatase (APISO): 38 U/L (ref 31–125)
BUN: 16 mg/dL (ref 7–25)
CO2: 27 mmol/L (ref 20–32)
Calcium: 9.4 mg/dL (ref 8.6–10.2)
Chloride: 103 mmol/L (ref 98–110)
Creat: 0.8 mg/dL (ref 0.50–0.97)
Globulin: 3 g/dL (ref 1.9–3.7)
Glucose, Bld: 95 mg/dL (ref 65–99)
Potassium: 4.4 mmol/L (ref 3.5–5.3)
Sodium: 137 mmol/L (ref 135–146)
Total Bilirubin: 0.4 mg/dL (ref 0.2–1.2)
Total Protein: 7.8 g/dL (ref 6.1–8.1)
eGFR: 97 mL/min/1.73m2 (ref >=60)

## 2024-03-30 ENCOUNTER — Other Ambulatory Visit: Payer: Self-pay

## 2024-03-30 ENCOUNTER — Other Ambulatory Visit: Payer: Self-pay | Admitting: Rheumatology

## 2024-03-30 ENCOUNTER — Ambulatory Visit: Payer: Self-pay | Admitting: Rheumatology

## 2024-03-30 DIAGNOSIS — H209 Unspecified iridocyclitis: Secondary | ICD-10-CM

## 2024-03-30 DIAGNOSIS — M457 Ankylosing spondylitis of lumbosacral region: Secondary | ICD-10-CM

## 2024-03-30 DIAGNOSIS — Z79899 Other long term (current) drug therapy: Secondary | ICD-10-CM

## 2024-03-30 MED ORDER — HUMIRA (2 PEN) 40 MG/0.4ML ~~LOC~~ AJKT
40.0000 mg | AUTO-INJECTOR | SUBCUTANEOUS | 2 refills | Status: DC
  Filled 2024-03-30 – 2024-03-31 (×2): qty 2, 28d supply, fill #0

## 2024-03-30 NOTE — Progress Notes (Signed)
 CBC and CMP are normal.

## 2024-03-30 NOTE — Telephone Encounter (Signed)
 Last Fill: 03/01/2024  Labs: 03/29/2024 CBC and CMP are normal.   TB Gold: 08/11/2023 negative    Next Visit: 06/01/2024  Last Visit: 12/02/2023  DX:Ankylosing spondylitis of lumbosacral region   Current Dose per office note on 12/02/2023: Humira  40mg  sq injections every 14 days   Okay to refill Humira ?

## 2024-03-30 NOTE — Telephone Encounter (Signed)
 Patient contacted the office to request a medication refill.   1. Name of Medication: Humira   2. How are you currently taking this medication (dosage and times per day)? One injection every 2 weeks   3. What pharmacy would you like for that to be sent to? Patient was not sure where it is sent to. Patient stated maybe [facility]

## 2024-03-31 ENCOUNTER — Other Ambulatory Visit: Payer: Self-pay

## 2024-04-02 ENCOUNTER — Other Ambulatory Visit: Payer: Self-pay

## 2024-04-02 NOTE — Progress Notes (Signed)
 Specialty Pharmacy Refill Coordination Note  Kathleen Nguyen is a 38 y.o. female contacted today regarding refills of specialty medication(s) Adalimumab  (Humira  (2 Pen), Humira -Psoriasis/Uveit Starter)  Used interpreter   Patient requested Delivery   Delivery date: 04/13/24   Verified address: [address]  [place] [address]   Medication will be filled on 10.20.25.

## 2024-04-12 ENCOUNTER — Other Ambulatory Visit (HOSPITAL_COMMUNITY): Payer: Self-pay

## 2024-04-12 ENCOUNTER — Other Ambulatory Visit: Payer: Self-pay

## 2024-04-12 ENCOUNTER — Telehealth: Payer: Self-pay

## 2024-04-12 ENCOUNTER — Other Ambulatory Visit: Payer: Self-pay | Admitting: Pharmacy Technician

## 2024-04-12 DIAGNOSIS — M457 Ankylosing spondylitis of lumbosacral region: Secondary | ICD-10-CM

## 2024-04-12 DIAGNOSIS — Z79899 Other long term (current) drug therapy: Secondary | ICD-10-CM

## 2024-04-12 DIAGNOSIS — H209 Unspecified iridocyclitis: Secondary | ICD-10-CM

## 2024-04-12 MED ORDER — HADLIMA PUSHTOUCH 40 MG/0.4ML ~~LOC~~ SOAJ
40.0000 mg | SUBCUTANEOUS | 2 refills | Status: DC
  Filled 2024-04-13: qty 0.8, 28d supply, fill #0
  Filled 2024-05-05: qty 0.8, 28d supply, fill #1
  Filled 2024-06-04: qty 0.8, 28d supply, fill #2

## 2024-04-12 NOTE — Telephone Encounter (Signed)
 Called patient using interpreter [place] # [phone] and advised of the change from Humira  to Hadlima . She has been advised that dosing and frequency will be same. Rx sent to [facility]  Sherry Pennant, PharmD, MPH, BCPS, CPP Clinical Pharmacist [facility])

## 2024-04-12 NOTE — Telephone Encounter (Signed)
 Received notification from [facility] that pt appears to have changed insurance and requires a new PA.  Submitted a Prior Authorization request to [employer] for HUMIRA  via CoverMyMeds. Will update once we receive a response.  Key: [id number]

## 2024-04-12 NOTE — Telephone Encounter (Signed)
 Received a fax regarding Prior Authorization from [employer] for HUMIRA . Authorization has been DENIED because pt must have previous trial/failure of ALL of the following: Abrilada, Yuflyma , Hulio, Hadlima , Simlandi, and Yusimry .  Hadlima  and Hulio appears to be the only medications on both of the formularies. Will try to submit for Hadlima .  Attempted to submit a Prior Authorization request to [employer] for HADLIMA  via CoverMyMeds, however the request was cancelled stating that no PA is required. Confirmed this by running test claim and learned that pt has a copay of $150 through primary insurance.   Key: [id number]  No PA appears to be needed through her secondary insurance either as COB brings copay down to $4. Will route to [facility] to have Rx rewritten to Hadlima .

## 2024-04-12 NOTE — Progress Notes (Signed)
 Insurance does not cover Humira . Hadlima  is covered with no auth required. Patient aware of change to biosimilar. Please reprocess. Thanks!

## 2024-04-13 ENCOUNTER — Other Ambulatory Visit: Payer: Self-pay

## 2024-04-14 ENCOUNTER — Other Ambulatory Visit: Payer: Self-pay

## 2024-04-15 ENCOUNTER — Telehealth: Payer: Self-pay | Admitting: Pharmacist

## 2024-04-15 NOTE — Telephone Encounter (Addendum)
 Patient reached out and stated she would like to come to office for training on Hadlima . She will stop by this afternoon and bring one pen dose with her (we do not retain samples in our office)  Sherry Pennant, PharmD, MPH, BCPS, CPP Clinical Pharmacist [facility])

## 2024-04-15 NOTE — Telephone Encounter (Signed)
 Patient requested Hadlima  training. Reviewed injection technique and sites. Patient slightly more nervous to utilize Hadlima  pen compared to Humira  but advised her to have husband for support if needed.  Patient has second dose at home for dose in 2 weeks.  Sherry Pennant, PharmD, MPH, BCPS, CPP Clinical Pharmacist [facility])

## 2024-04-19 ENCOUNTER — Encounter: Payer: Self-pay | Admitting: *Deleted

## 2024-04-19 NOTE — Congregational Nurse Program (Signed)
  Dept: [phone]   Congregational Nurse Program Note  Date of Encounter: 04/19/2024  Past Medical History: Past Medical History:  Diagnosis Date   Anemia    Ankylosing spondylitis (HCC)    Ankylosing spondylitis (HCC)    Rheumatoid arthritis (HCC)     Encounter Details:  Community Questionnaire - 04/19/24 1600       Questionnaire   Ask client: Do you give verbal consent for me to treat you today? Yes    Student Assistance N/A    Location Patient Served  [facility]    Encounter Setting CN site    Population Status Migrant/Refugee    Insurance Medicaid    Insurance/Financial Assistance Referral N/A    Medication N/A    Medical Provider Yes    Screening Referrals Made N/A    Medical Referrals Made N/A    Medical Appointment Completed N/A    CNP Interventions Advocate/Support;Counsel;Educate    Screenings CN Performed Blood Pressure    ED Visit Averted Yes    Life-Saving Intervention Made N/A         Lugene Ropes, RN, MSN, CNP [phone] Office [phone] Cell

## 2024-04-20 ENCOUNTER — Other Ambulatory Visit: Payer: Self-pay

## 2024-05-05 ENCOUNTER — Other Ambulatory Visit: Payer: Self-pay

## 2024-05-06 ENCOUNTER — Other Ambulatory Visit: Payer: Self-pay

## 2024-05-06 NOTE — Progress Notes (Signed)
 Specialty Pharmacy Refill Coordination Note  Kathleen Nguyen is a 38 y.o. female contacted today regarding refills of specialty medication(s) Adalimumab -bwwd (Hadlima  PushTouch)   Patient requested Delivery   Delivery date: 05/11/24   Verified address: [address]  [place] [address]   Medication will be filled on: 05/10/24

## 2024-05-10 ENCOUNTER — Other Ambulatory Visit: Payer: Self-pay

## 2024-05-10 ENCOUNTER — Ambulatory Visit (INDEPENDENT_AMBULATORY_CARE_PROVIDER_SITE_OTHER): Admitting: Obstetrics and Gynecology

## 2024-05-10 VITALS — BP 109/75 | HR 121 | Wt 165.0 lb

## 2024-05-10 DIAGNOSIS — L659 Nonscarring hair loss, unspecified: Secondary | ICD-10-CM

## 2024-05-10 DIAGNOSIS — N898 Other specified noninflammatory disorders of vagina: Secondary | ICD-10-CM

## 2024-05-10 DIAGNOSIS — Z30431 Encounter for routine checking of intrauterine contraceptive device: Secondary | ICD-10-CM

## 2024-05-10 DIAGNOSIS — D219 Benign neoplasm of connective and other soft tissue, unspecified: Secondary | ICD-10-CM

## 2024-05-10 NOTE — Addendum Note (Signed)
 Addended by: CLEATUS MOCCASIN A on: 05/10/2024 10:01 AM   Modules accepted: Orders

## 2024-05-10 NOTE — Progress Notes (Signed)
 GYNECOLOGY OFFICE VISIT NOTE  History:  Kathleen Nguyen is a 38 y.o. [phone] here today for concerns for her IUD.   She had an IUD placed one year ago in [place] for her heavy bleeding and pain related to her fibroid. It has helped immensely with this.   However since placement she has noted dryness with intercourse - she would have pain if not using lubricant. She also notes hair loss that started with the IUD placement and has continued since that time. She also notes hyperpigementation and some loss of muscle.    Past Medical History:  Diagnosis Date   Anemia    Ankylosing spondylitis (HCC)    Ankylosing spondylitis (HCC)    Rheumatoid arthritis (HCC)     Past Surgical History:  Procedure Laterality Date   CESAREAN SECTION     CESAREAN SECTION     CESAREAN SECTION N/A 05/03/2019   Procedure: CESAREAN SECTION;  Surgeon: Eveline Lynwood MATSU, MD;  Location: [facility] LD ORS;  Service: Obstetrics;  Laterality: N/A;    The following portions of the patient's history were reviewed and updated as appropriate: allergies, current medications, past family history, past medical history, past social history, past surgical history and problem list.   Health Maintenance:   Normal pap and negative HRHPV:   Diagnosis  Date Value Ref Range Status  02/02/2021   Final   - Negative for intraepithelial lesion or malignancy (NILM)   Review of Systems:  Pertinent items noted in HPI and remainder of comprehensive ROS otherwise negative.  Physical Exam:  BP 109/75   Pulse (!) 121   Wt 165 lb (74.8 kg)   LMP 04/16/2024 (Exact Date)   BMI 27.46 kg/m  CONSTITUTIONAL: Well-developed, well-nourished female in no acute distress.  HEENT:  Normocephalic, atraumatic. External right and left ear normal. No scleral icterus.  NECK: Normal range of motion, supple, no masses noted on observation SKIN: No rash noted. Not diaphoretic. No erythema. No pallor. MUSCULOSKELETAL: Normal range of motion. No edema  noted. NEUROLOGIC: Alert and oriented to person, place, and time. Normal muscle tone coordination. No cranial nerve deficit noted. PSYCHIATRIC: Normal mood and affect. Normal behavior. Normal judgment and thought content.  PELVIC: Normal EFG. Normal appearing vagina although dryness noted even with lubricant used with speculum. IUD strings visualized. Cervix normal in appearance.   Assessment and Plan:  Kathleen Nguyen was seen today for gyn visit.  Diagnoses and all orders for this visit:  Hair loss Check labs for menopause, thyroid and female hormones for possible impact on hair loss.  Can be due to IUD but would check before removing.  Also refer to derm for other options in the event she wants to keep IUD in.  -     Estradiol  -     Follicle stimulating hormone -     Testosterone,Free and Total -     TSH Rfx on Abnormal to Free T4 -     Ambulatory referral to Dermatology  IUD check up IUD in place  Fibroid Discussed option for Kyleena if other problems from Mirena too great. Would have less impact on bleeding and pain potentially, but may cause less systemic side effects.   Vaginal dryness Silicone based lubricant given Discussed may also be related to IUD.   Routine preventative health maintenance measures emphasized. Please refer to After Visit Summary for other counseling recommendations.   Return in about 1 year (around 05/10/2025) for annual.  Vina Solian, MD, FACOG Obstetrician & Gynecologist, Faculty Practice  Center for [employer], [facility]

## 2024-05-11 LAB — ESTRADIOL: Estradiol: 103 pg/mL

## 2024-05-11 LAB — FOLLICLE STIMULATING HORMONE: FSH: 1.6 m[IU]/mL

## 2024-05-11 LAB — TSH RFX ON ABNORMAL TO FREE T4: TSH: 0.614 u[IU]/mL (ref 0.450–4.500)

## 2024-05-11 LAB — TESTOSTERONE,FREE AND TOTAL
Testosterone, Free: 1 pg/mL (ref 0.0–4.2)
Testosterone: 14 ng/dL (ref 8–60)

## 2024-05-13 ENCOUNTER — Ambulatory Visit: Payer: Self-pay | Admitting: Obstetrics and Gynecology

## 2024-05-13 NOTE — Telephone Encounter (Addendum)
-----   Message from [place] sent at 05/13/2024  8:11 AM EST ----- Please contact patient and let her know her blood work is normal. No evidence of thyroid issues or premature ovarian insufficiency.  Thanks, pad ----- Message ----- From: Rebecka Memos Lab Results In Sent: 05/11/2024   4:36 AM EST To: Vina Solian, MD  11/20  1515 Called pt w/[employer] interpreter # [phone]. She was informed of test results and remarks per Dr. Solian. Pt voiced understanding.

## 2024-05-17 ENCOUNTER — Encounter: Payer: Self-pay | Admitting: *Deleted

## 2024-05-18 NOTE — Progress Notes (Signed)
 Office Visit Note  Patient: Kathleen Nguyen             Date of Birth: 03-Nov-1985           MRN: [id number]             PCP: Celestia Rosaline SQUIBB, NP Referring: Celestia Rosaline SQUIBB, NP Visit Date: 06/01/2024 Occupation: Data Unavailable  Interpreter: Wendelyn Exon  Subjective:  Pain in multiple joints  History of Present Illness: Kathleen Nguyen is a 38 y.o. female with ankylosing spondylitis.  She continues to have pain and discomfort in her SI joints, and left knee.  She notices some swelling in the left knee joint.  She has been taking Humira  40 mg subcu every 14 days without any interruption.  She has noted some hair loss.  She also complains of dry eyes.  She was evaluated by the ophthalmologist and was told that she had dry eyes.  There was no iritis.    Activities of Daily Living:  Patient reports morning stiffness for 0 minutes.   Patient Reports nocturnal pain.  Difficulty dressing/grooming: Denies Difficulty climbing stairs: Denies Difficulty getting out of chair: Denies Difficulty using hands for taps, buttons, cutlery, and/or writing: Denies  Review of Systems  Constitutional:  Positive for fatigue.  HENT:  Positive for mouth dryness. Negative for mouth sores.   Eyes:  Positive for dryness.  Respiratory:  Negative for shortness of breath.   Cardiovascular:  Negative for chest pain and palpitations.  Gastrointestinal:  Positive for constipation. Negative for blood in stool and diarrhea.  Endocrine: Negative for increased urination.  Genitourinary:  Negative for involuntary urination.  Musculoskeletal:  Positive for joint pain, joint pain, joint swelling, myalgias, muscle weakness and myalgias. Negative for gait problem, morning stiffness and muscle tenderness.  Skin:  Positive for color change, rash, hair loss and sensitivity to sunlight.  Allergic/Immunologic: Negative for susceptible to infections.  Neurological:  Negative for dizziness and headaches.  Hematological:   Negative for swollen glands.  Psychiatric/Behavioral:  Negative for depressed mood and sleep disturbance. The patient is not nervous/anxious.     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   Anemia in pregnancy 12/07/2018   Language barrier 12/04/2018   History of cesarean delivery 12/04/2018   Ankylosing spondylitis (HCC) 11/11/2018   Uterine fibroid 12/11/2016    Past Medical History:  Diagnosis Date   Anemia    Ankylosing spondylitis (HCC)    Ankylosing spondylitis (HCC)    Rheumatoid arthritis (HCC)     Family History  Problem Relation Age of Onset   Arthritis/Rheumatoid Mother    Past Surgical History:  Procedure Laterality Date   CESAREAN SECTION     CESAREAN SECTION     CESAREAN SECTION N/A 05/03/2019   Procedure: CESAREAN SECTION;  Surgeon: Eveline Lynwood MATSU, MD;  Location: [facility] LD ORS;  Service: Obstetrics;  Laterality: N/A;   Social History   Tobacco Use   Smoking status: Never    Passive exposure: Never   Smokeless tobacco: Never  Vaping Use   Vaping status: Never Used  Substance Use Topics   Alcohol use: No   Drug use: No   Social History   Social History Narrative   Not on file     Immunization History  Administered Date(s) Administered   Influenza,inj,Quad PF,6+ Mos 02/22/2019   Janssen (J&J) SARS-COV-2 Vaccination 09/18/2019   PFIZER(Purple Top)SARS-COV-2  Vaccination 05/08/2020   Tdap 12/18/2017, 02/22/2019     Objective: Vital Signs: BP 103/72   Pulse 85   Temp 98.6 F (37 C)   Resp 14   Ht 5' 5 (1.651 m)   Wt 163 lb 12.8 oz (74.3 kg)   LMP 04/16/2024 (Exact Date)   BMI 27.26 kg/m    Physical Exam Vitals and nursing note reviewed.  Constitutional:      Appearance: She is well-developed.  HENT:     Head: Normocephalic and atraumatic.  Eyes:     Conjunctiva/sclera: Conjunctivae normal.  Cardiovascular:     Rate and Rhythm:  Normal rate and regular rhythm.     Heart sounds: Normal heart sounds.  Pulmonary:     Effort: Pulmonary effort is normal.     Breath sounds: Normal breath sounds.  Abdominal:     General: Bowel sounds are normal.     Palpations: Abdomen is soft.  Musculoskeletal:     Cervical back: Normal range of motion.  Lymphadenopathy:     Cervical: No cervical adenopathy.  Skin:    General: Skin is warm and dry.     Capillary Refill: Capillary refill takes less than 2 seconds.  Neurological:     Mental Status: She is alert and oriented to person, place, and time.  Psychiatric:        Behavior: Behavior normal.      Musculoskeletal Exam: Cervical, thoracic and lumbar spine were in good range of motion.  She had tenderness over bilateral SI joints more on the left side shoulder joints, elbow joints, wrist joints, MCPs, PIPs and DIPs were in good range of motion with no synovitis.  Hip joints and knee joints were in good range of motion without any warmth swelling or effusion.  There was no tenderness over ankles or MTPs.   CDAI Exam: CDAI Score: -- Patient Global: --; Provider Global: -- Swollen: --; Tender: -- Joint Exam 06/01/2024   No joint exam has been documented for this visit   There is currently no information documented on the homunculus. Go to the Rheumatology activity and complete the homunculus joint exam.  Investigation: No additional findings.  Imaging: No results found.  Recent Labs: Lab Results  Component Value Date   WBC 4.5 03/29/2024   HGB 12.7 03/29/2024   PLT 290 03/29/2024   NA 137 03/29/2024   K 4.4 03/29/2024   CL 103 03/29/2024   CO2 27 03/29/2024   GLUCOSE 95 03/29/2024   BUN 16 03/29/2024   CREATININE 0.80 03/29/2024   BILITOT 0.4 03/29/2024   ALKPHOS 29 (L) 12/05/2021   AST 11 03/29/2024   ALT 10 03/29/2024   PROT 7.8 03/29/2024   ALBUMIN 3.7 12/05/2021   CALCIUM  9.4 03/29/2024   GFRAA 144 10/25/2020   QFTBGOLDPLUS NEGATIVE 08/11/2023     Speciality Comments: Humira  started 05/12/23 (uveitis dose)  Procedures:  No procedures performed Allergies: Patient has no known allergies.   Assessment / Plan:     Visit Diagnoses: Ankylosing spondylitis of lumbosacral region (HCC)-patient complains of ongoing pain and discomfort in the SI joints especially on the left side.  She also gives history of discomfort and swelling in her left knee joint.  She had good mobility in the lumbar spine.  She had mild tenderness over the left SI joint.  No warmth swelling or effusion was noted in the left knee joint.  She has been taking adalimumab  biosimilar every 14 days without any interruption.  Sacroiliitis-she gives history  of chronic discomfort in the SI joints.  She has mild tenderness of the left SI joint.  She could mobility in the lumbar spine.  High risk medication use - Hadlima  40mg  sq injections every 14 days.Humira  was started on the loading dose of Humira  05/02/2023.Previous therapy: Cimzia . Using IUD.  March 29, 2024 CBC and CMP were normal.  TB Gold was negative on August 11, 2023.  She was advised to get labs every 3 months.  Advised her to get TB Gold with her next labs in February.  Information immunization was placed in the AVS.  She was advised to hold Hadlima  if she develops an infection resume until infection resolves.  Annual skin examination to screen for skin cancer was advised.  Use of sunscreen and sun protection was discussed.  Iritis-she has not had any recent iritis flare.  She has been seeing her ophthalmologist on a regular basis.  She states she was told that she has dry eyes.  Neck pain-he continues to have some stiffness in her neck off-and-on.  She good range of motion without discomfort.  Pain in thoracic spine-she had no tenderness over the thoracic spine today.  Patient states that she has been doing Pilates which helps.  Chronic pain of both knees-she complains of ongoing discomfort in left knee joint.  No  warmth swelling or effusion was noted.  Lower extremity muscle strengthening signs were discussed.  Dry eyes-diagnosed by ophthalmologist.  Over-the-counter products were discussed.  Other medical problems are listed as follows:  Uterine adenomyoma  Language barrier-interpreter Wendelyn Exon, was present through the entire visit.  Muscle fatigue-she continues to have some fatigue.  No muscular weakness or tenderness was noted.  Other fatigue-need for regular exercise was emphasized.  Orders: Orders Placed This Encounter  Procedures   QuantiFERON-TB Gold Plus   No orders of the defined types were placed in this encounter.    Follow-Up Instructions: Return in about 5 months (around 10/30/2024) for Ankylosing spondylitis.   Maya Nash, MD  Note - This record has been created using Animal nutritionist.  Chart creation errors have been sought, but may not always  have been located. Such creation errors do not reflect on  the standard of medical care.

## 2024-05-21 NOTE — Congregational Nurse Program (Signed)
  Dept: [phone]   Congregational Nurse Program Note  Date of Encounter: 05/17/2024  Past Medical History: Past Medical History:  Diagnosis Date   Anemia    Ankylosing spondylitis (HCC)    Ankylosing spondylitis (HCC)    Rheumatoid arthritis [facility])     Encounter Details:  Community Questionnaire - 05/17/24 1555       Questionnaire   Ask client: Do you give verbal consent for me to treat you today? Yes    Student Assistance N/A    Location Patient Served  [facility]    Encounter Setting CN site    Population Status Migrant/Refugee    Insurance Medicaid    Insurance/Financial Assistance Referral N/A    Medication N/A    Medical Provider Yes    Screening Referrals Made N/A    Medical Referrals Made N/A    Medical Appointment Completed N/A    CNP Interventions Advocate/Support;Counsel;Educate    Screenings CN Performed Blood Pressure    ED Visit Averted N/A    Life-Saving Intervention Made N/A         .eml

## 2024-06-01 ENCOUNTER — Ambulatory Visit: Attending: Rheumatology | Admitting: Rheumatology

## 2024-06-01 ENCOUNTER — Encounter: Payer: Self-pay | Admitting: Rheumatology

## 2024-06-01 VITALS — BP 103/72 | HR 85 | Temp 98.6°F | Resp 14 | Ht 65.0 in | Wt 163.8 lb

## 2024-06-01 DIAGNOSIS — H209 Unspecified iridocyclitis: Secondary | ICD-10-CM

## 2024-06-01 DIAGNOSIS — H04123 Dry eye syndrome of bilateral lacrimal glands: Secondary | ICD-10-CM

## 2024-06-01 DIAGNOSIS — M6289 Other specified disorders of muscle: Secondary | ICD-10-CM | POA: Diagnosis not present

## 2024-06-01 DIAGNOSIS — M461 Sacroiliitis, not elsewhere classified: Secondary | ICD-10-CM | POA: Diagnosis not present

## 2024-06-01 DIAGNOSIS — M542 Cervicalgia: Secondary | ICD-10-CM | POA: Diagnosis not present

## 2024-06-01 DIAGNOSIS — Z603 Acculturation difficulty: Secondary | ICD-10-CM | POA: Diagnosis not present

## 2024-06-01 DIAGNOSIS — M457 Ankylosing spondylitis of lumbosacral region: Secondary | ICD-10-CM | POA: Diagnosis not present

## 2024-06-01 DIAGNOSIS — Z758 Other problems related to medical facilities and other health care: Secondary | ICD-10-CM

## 2024-06-01 DIAGNOSIS — G8929 Other chronic pain: Secondary | ICD-10-CM

## 2024-06-01 DIAGNOSIS — M25561 Pain in right knee: Secondary | ICD-10-CM | POA: Diagnosis not present

## 2024-06-01 DIAGNOSIS — D269 Other benign neoplasm of uterus, unspecified: Secondary | ICD-10-CM | POA: Diagnosis not present

## 2024-06-01 DIAGNOSIS — Z79899 Other long term (current) drug therapy: Secondary | ICD-10-CM

## 2024-06-01 DIAGNOSIS — R5383 Other fatigue: Secondary | ICD-10-CM | POA: Diagnosis not present

## 2024-06-01 DIAGNOSIS — M546 Pain in thoracic spine: Secondary | ICD-10-CM

## 2024-06-01 NOTE — Patient Instructions (Addendum)
 Standing Labs We placed an order today for your standing lab work.   Please have your standing labs drawn in January and every 3 months  Please have your labs drawn 2 weeks prior to your appointment so that the provider can discuss your lab results at your appointment, if possible.  Please note that you may see your imaging and lab results in MyChart before we have reviewed them. We will contact you once all results are reviewed. Please allow our office up to 72 hours to thoroughly review all of the results before contacting the office for clarification of your results.  WALK-IN LAB HOURS  Monday through Thursday from 8:00 am - 4:30 pm and Friday from 8:00 am-12:00 pm.  Patients with office visits requiring labs will be seen before walk-in labs.  You may encounter longer than normal wait times. Please allow additional time. Wait times may be shorter on  Monday and Thursday afternoons.  We do not book appointments for walk-in labs. We appreciate your patience and understanding with our staff.   Labs are drawn by [facility]. Please bring your co-pay at the time of your lab draw.  You may receive a bill from [facility] for your lab work.  Please note if you are on Hydroxychloroquine and and an order has been placed for a Hydroxychloroquine level,  you will need to have it drawn 4 hours or more after your last dose.  If you wish to have your labs drawn at another location, please call the office 24 hours in advance so we can fax the orders.  The office is located at [address], [address], [place], [place] [address]   If you have any questions regarding directions or hours of operation,  please call [phone].   As a reminder, please drink plenty of water  prior to coming for your lab work. Thanks!   Vaccines You are taking a medication(s) that can suppress your immune system.  The following immunizations are recommended: Flu annually Covid-19  HPV Td/Tdap (tetanus, diphtheria, pertussis)  every 10 years Pneumonia (Prevnar 15 then Pneumovax 23 at least 1 year apart.  Alternatively, can take Prevnar 20 without needing additional dose) Shingrix: 2 doses from 4 weeks to 6 months apart  Please check with your PCP to make sure you are up to date.   If you have signs or symptoms of an infection or start antibiotics: First, call your PCP for workup of your infection. Hold your medication through the infection, until you complete your antibiotics, and until symptoms resolve if you take the following: Injectable medication (Actemra, Benlysta, Cimzia , Cosentyx, Enbrel , Humira , Kevzara, Orencia, Remicade, Simponi, Stelara, Taltz, Tremfya) Methotrexate Leflunomide (Arava) Mycophenolate (Cellcept) Xeljanz, Olumiant, or Rinvoq   Get an annual skin examination to screen for skin cancer while you are on Humira .  Please use sunscreen and sun protection

## 2024-06-04 ENCOUNTER — Other Ambulatory Visit: Payer: Self-pay

## 2024-06-04 NOTE — Progress Notes (Signed)
 Specialty Pharmacy Refill Coordination Note  Kathleen Nguyen is a 38 y.o. female contacted today regarding refills of specialty medication(s) Adalimumab -bwwd (Hadlima  PushTouch)   Patient requested Delivery   Delivery date: 06/11/24   Verified address: [address]  [place] [address]   Medication will be filled on: 06/10/24

## 2024-07-01 ENCOUNTER — Other Ambulatory Visit: Payer: Self-pay

## 2024-07-01 ENCOUNTER — Other Ambulatory Visit: Payer: Self-pay | Admitting: Rheumatology

## 2024-07-01 DIAGNOSIS — Z79899 Other long term (current) drug therapy: Secondary | ICD-10-CM

## 2024-07-01 DIAGNOSIS — M457 Ankylosing spondylitis of lumbosacral region: Secondary | ICD-10-CM

## 2024-07-01 DIAGNOSIS — H209 Unspecified iridocyclitis: Secondary | ICD-10-CM

## 2024-07-01 MED ORDER — HADLIMA PUSHTOUCH 40 MG/0.4ML ~~LOC~~ SOAJ
40.0000 mg | SUBCUTANEOUS | 0 refills | Status: DC
  Filled 2024-07-02 (×2): qty 0.8, 28d supply, fill #0

## 2024-07-01 NOTE — Telephone Encounter (Signed)
 Last Fill: 04/12/2024  Labs: 03/29/2024 CBC and CMP are normal.   TB Gold: 08/11/2023 Neg    Next Visit: 11/09/2024  Last Visit: 06/01/2024  DX:Ankylosing spondylitis of lumbosacral region   Current Dose per office note 06/01/2024: Hadlima  40mg  sq injections every 14 days   Patient advised she is due to update her labs. Patient states she will update her labs tomorrow.   Okay to refill Hadlima ?

## 2024-07-02 ENCOUNTER — Other Ambulatory Visit: Payer: Self-pay

## 2024-07-02 ENCOUNTER — Other Ambulatory Visit: Payer: Self-pay | Admitting: *Deleted

## 2024-07-02 ENCOUNTER — Other Ambulatory Visit (HOSPITAL_COMMUNITY): Payer: Self-pay

## 2024-07-02 DIAGNOSIS — Z79899 Other long term (current) drug therapy: Secondary | ICD-10-CM

## 2024-07-05 ENCOUNTER — Telehealth: Payer: Self-pay | Admitting: Rheumatology

## 2024-07-05 NOTE — Telephone Encounter (Signed)
 Pt is requesting a call back from clinic about her labs.

## 2024-07-05 NOTE — Telephone Encounter (Signed)
 Patient wanted us  to be aware she had her labs done with her PCP. Patient wanted to make sure it was okay as they had done some of the same test as we needed. Patient advised that is fine. Patient advised she will need to update her TB Gold next month. Patient expressed understanding.

## 2024-07-06 ENCOUNTER — Other Ambulatory Visit: Payer: Self-pay

## 2024-07-07 ENCOUNTER — Other Ambulatory Visit: Payer: Self-pay

## 2024-07-07 ENCOUNTER — Other Ambulatory Visit (HOSPITAL_COMMUNITY): Payer: Self-pay

## 2024-07-07 ENCOUNTER — Telehealth: Payer: Self-pay

## 2024-07-07 DIAGNOSIS — M457 Ankylosing spondylitis of lumbosacral region: Secondary | ICD-10-CM

## 2024-07-07 DIAGNOSIS — Z79899 Other long term (current) drug therapy: Secondary | ICD-10-CM

## 2024-07-07 DIAGNOSIS — H209 Unspecified iridocyclitis: Secondary | ICD-10-CM

## 2024-07-07 MED ORDER — ADALIMUMAB-AATY (2 PEN) 40 MG/0.4ML ~~LOC~~ AJKT
40.0000 mg | AUTO-INJECTOR | SUBCUTANEOUS | 0 refills | Status: AC
  Filled 2024-07-07 (×2): qty 2, 28d supply, fill #0

## 2024-07-07 NOTE — Progress Notes (Signed)
 Received notification from [employer] [place] regarding a prior authorization for adalimumab -aaty. Authorization has been APPROVED from 07/07/2024 to 07/07/2025. Approval letter sent to scan center.  Per test claim, copay for 28 days supply is $4 (with Medicaid run as secondary)  Patient can continue to fill through [facility]: [phone]   Authorization # [id number]  Rx sent to [facility]  Sherry Pennant, PharmD, MPH, BCPS, CPP Clinical Pharmacist

## 2024-07-07 NOTE — Telephone Encounter (Signed)
 Received notification from [employer] [place] regarding a prior authorization for adalimumab -aaty. Authorization has been APPROVED from 07/07/2024 to 07/07/2025. Approval letter sent to scan center.  Per test claim, copay for 28 days supply is $4 (with Medicaid run as secondary)  Patient can continue to fill through [facility]: [phone]   Authorization # [id number]  Rx sent to [facility]  Sherry Pennant, PharmD, MPH, BCPS, CPP Clinical Pharmacist

## 2024-07-07 NOTE — Telephone Encounter (Signed)
 Pt has had yet another insurance change and now [employer]  is rejecting as non-formulary.  Per initial submission, Yuflyma  appears to be the preferred product.  Submitted a Prior Authorization request to [facility] for [employer]  via CoverMyMeds. Will update once we receive a response.  Key: [id number]

## 2024-07-09 ENCOUNTER — Other Ambulatory Visit: Payer: Self-pay

## 2024-07-09 NOTE — Progress Notes (Signed)
 Specialty Pharmacy Refill Coordination Note  Kathleen Nguyen is a 39 y.o. female contacted today regarding refills of specialty medication(s) Adalimumab -aaty  Spoke with patient via interpreter.  Patient requested Delivery   Delivery date: 07/14/24   Verified address: [address]  [name]   Medication will be filled on: 07/13/24

## 2024-07-12 ENCOUNTER — Other Ambulatory Visit: Payer: Self-pay

## 2024-07-13 ENCOUNTER — Other Ambulatory Visit: Payer: Self-pay

## 2024-11-09 ENCOUNTER — Ambulatory Visit: Admitting: Rheumatology
